# Patient Record
Sex: Female | Born: 1980 | Race: White | Hispanic: No | Marital: Married | Smoking: Never smoker
Health system: Southern US, Community
[De-identification: ages and names within clinical notes are randomized; demographics above are authoritative.]

## PROBLEM LIST (undated history)

## (undated) DIAGNOSIS — F32A Depression, unspecified: Secondary | ICD-10-CM

## (undated) DIAGNOSIS — Z8489 Family history of other specified conditions: Secondary | ICD-10-CM

## (undated) DIAGNOSIS — I6783 Posterior reversible encephalopathy syndrome: Secondary | ICD-10-CM

## (undated) DIAGNOSIS — D649 Anemia, unspecified: Secondary | ICD-10-CM

## (undated) DIAGNOSIS — F908 Attention-deficit hyperactivity disorder, other type: Secondary | ICD-10-CM

## (undated) DIAGNOSIS — K219 Gastro-esophageal reflux disease without esophagitis: Secondary | ICD-10-CM

## (undated) DIAGNOSIS — G971 Other reaction to spinal and lumbar puncture: Secondary | ICD-10-CM

## (undated) DIAGNOSIS — F419 Anxiety disorder, unspecified: Secondary | ICD-10-CM

## (undated) DIAGNOSIS — F329 Major depressive disorder, single episode, unspecified: Secondary | ICD-10-CM

## (undated) DIAGNOSIS — F909 Attention-deficit hyperactivity disorder, unspecified type: Secondary | ICD-10-CM

## (undated) DIAGNOSIS — G932 Benign intracranial hypertension: Secondary | ICD-10-CM

## (undated) DIAGNOSIS — R51 Headache: Secondary | ICD-10-CM

## (undated) DIAGNOSIS — R519 Headache, unspecified: Secondary | ICD-10-CM

## (undated) HISTORY — PX: OTHER SURGICAL HISTORY: SHX169

## (undated) HISTORY — PX: ABDOMINAL HYSTERECTOMY: SHX81

## (undated) HISTORY — PX: GASTRECTOMY: SHX58

## (undated) HISTORY — DX: Benign intracranial hypertension: G93.2

## (undated) HISTORY — PX: TONSILLECTOMY: SUR1361

---

## 2000-01-10 ENCOUNTER — Other Ambulatory Visit: Admission: RE | Admit: 2000-01-10 | Discharge: 2000-01-10 | Payer: Self-pay | Admitting: Obstetrics and Gynecology

## 2003-09-19 ENCOUNTER — Other Ambulatory Visit: Admission: RE | Admit: 2003-09-19 | Discharge: 2003-09-19 | Payer: Self-pay | Admitting: Obstetrics and Gynecology

## 2004-10-25 ENCOUNTER — Other Ambulatory Visit: Admission: RE | Admit: 2004-10-25 | Discharge: 2004-10-25 | Payer: Self-pay | Admitting: Obstetrics and Gynecology

## 2005-03-03 ENCOUNTER — Inpatient Hospital Stay (HOSPITAL_COMMUNITY): Admission: AD | Admit: 2005-03-03 | Discharge: 2005-03-03 | Payer: Self-pay | Admitting: Obstetrics and Gynecology

## 2005-03-12 ENCOUNTER — Inpatient Hospital Stay (HOSPITAL_COMMUNITY): Admission: AD | Admit: 2005-03-12 | Discharge: 2005-03-13 | Payer: Self-pay | Admitting: Obstetrics and Gynecology

## 2005-04-30 ENCOUNTER — Inpatient Hospital Stay (HOSPITAL_COMMUNITY): Admission: AD | Admit: 2005-04-30 | Discharge: 2005-04-30 | Payer: Self-pay | Admitting: Obstetrics and Gynecology

## 2005-05-12 ENCOUNTER — Inpatient Hospital Stay (HOSPITAL_COMMUNITY): Admission: AD | Admit: 2005-05-12 | Discharge: 2005-05-16 | Payer: Self-pay | Admitting: Obstetrics and Gynecology

## 2005-05-19 ENCOUNTER — Ambulatory Visit: Admission: RE | Admit: 2005-05-19 | Discharge: 2005-05-19 | Payer: Self-pay | Admitting: Obstetrics and Gynecology

## 2007-06-02 ENCOUNTER — Inpatient Hospital Stay (HOSPITAL_COMMUNITY): Admission: AD | Admit: 2007-06-02 | Discharge: 2007-06-03 | Payer: Self-pay | Admitting: Obstetrics and Gynecology

## 2007-10-06 ENCOUNTER — Inpatient Hospital Stay (HOSPITAL_COMMUNITY): Admission: AD | Admit: 2007-10-06 | Discharge: 2007-10-06 | Payer: Self-pay | Admitting: Obstetrics and Gynecology

## 2007-10-08 ENCOUNTER — Inpatient Hospital Stay (HOSPITAL_COMMUNITY): Admission: AD | Admit: 2007-10-08 | Discharge: 2007-10-09 | Payer: Self-pay | Admitting: Obstetrics and Gynecology

## 2007-10-10 ENCOUNTER — Inpatient Hospital Stay (HOSPITAL_COMMUNITY): Admission: AD | Admit: 2007-10-10 | Discharge: 2007-10-10 | Payer: Self-pay | Admitting: Obstetrics and Gynecology

## 2007-10-15 ENCOUNTER — Inpatient Hospital Stay (HOSPITAL_COMMUNITY): Admission: RE | Admit: 2007-10-15 | Discharge: 2007-10-18 | Payer: Self-pay | Admitting: Obstetrics and Gynecology

## 2007-10-22 ENCOUNTER — Inpatient Hospital Stay (HOSPITAL_COMMUNITY): Admission: AD | Admit: 2007-10-22 | Discharge: 2007-10-22 | Payer: Self-pay | Admitting: Obstetrics and Gynecology

## 2007-10-23 ENCOUNTER — Inpatient Hospital Stay (HOSPITAL_COMMUNITY): Admission: AD | Admit: 2007-10-23 | Discharge: 2007-10-23 | Payer: Self-pay | Admitting: Obstetrics and Gynecology

## 2008-09-20 ENCOUNTER — Emergency Department (HOSPITAL_COMMUNITY): Admission: EM | Admit: 2008-09-20 | Discharge: 2008-09-21 | Payer: Self-pay | Admitting: Emergency Medicine

## 2009-03-09 ENCOUNTER — Encounter
Admission: RE | Admit: 2009-03-09 | Discharge: 2009-03-09 | Payer: Self-pay | Admitting: Physical Medicine and Rehabilitation

## 2010-01-31 ENCOUNTER — Emergency Department (HOSPITAL_COMMUNITY)
Admission: EM | Admit: 2010-01-31 | Discharge: 2010-01-31 | Payer: Self-pay | Source: Home / Self Care | Admitting: Emergency Medicine

## 2010-02-05 ENCOUNTER — Encounter: Admission: RE | Admit: 2010-02-05 | Discharge: 2010-02-05 | Payer: Self-pay | Admitting: Neurology

## 2010-02-07 ENCOUNTER — Emergency Department (HOSPITAL_COMMUNITY)
Admission: EM | Admit: 2010-02-07 | Discharge: 2010-02-08 | Payer: Self-pay | Source: Home / Self Care | Admitting: Emergency Medicine

## 2010-05-14 LAB — URINALYSIS, ROUTINE W REFLEX MICROSCOPIC
Glucose, UA: NEGATIVE mg/dL
Hgb urine dipstick: NEGATIVE
Ketones, ur: NEGATIVE mg/dL
Nitrite: NEGATIVE
Specific Gravity, Urine: 1.011 (ref 1.005–1.030)
Urobilinogen, UA: 0.2 mg/dL (ref 0.0–1.0)
pH: 6.5 (ref 5.0–8.0)

## 2010-07-16 NOTE — Discharge Summary (Signed)
NAMEJASLEN, ADCOX            ACCOUNT NO.:  [id number]   MEDICAL RECORD NO.:  [id number]          PATIENT TYPE:  INP   LOCATION:  9143                          FACILITY:  [facility]   PHYSICIAN:  Crist Fat. Rivard, M.D. DATE OF BIRTH:  02/06/1981   DATE OF ADMISSION:  10/15/2007  DATE OF DISCHARGE:  10/18/2007                               DISCHARGE SUMMARY   [name] is a 30 year old gravida 2, para 1-0-0-1, who presents on  the date of admission of October 15, 2007, for a scheduled repeat  cesarean section.  The patient's pregnancy has been remarkable for:   1. Previous C-section in March 2007, secondary to failure to progress      and PIH with desire for repeat.  2. History of preeclampsia with first pregnancy with sporadic blood      pressure elevations this pregnancy, but with no preeclampsia.  3. Group beta strep negative.  4. First trimester spotting.   ADMITTING DIAGNOSES:  1. Intrauterine pregnancy at 38-3/7 weeks.  2. Previous cesarean section with desire for repeat.  3. Pregnancy-induced hypertension.  4. Group beta strep negative.   DISCHARGE DIAGNOSES:  1. Intrauterine pregnancy at term.  2. Status post a repeat low transverse cesarean section with findings      of a viable female infant by the name of [name] on October 15, 2007, at      1402 p.m.  Weight was 7 pounds 8 ounces, grams was 3415.  Apgar      score 9 at one minute and 9 at five minutes.  3. Anemia.  4. Breast and bottle feeding.  5. Significant back pain, status post spinal.   PROCEDURES:  1. Spinal anesthesia.  2. Repeat low transverse cesarean section.   HOSPITAL COURSE:  The patient presented to short stay where she was  prepped for her procedure.  She was taken to the OR where a repeat low  transverse C-section was performed by Dr. Dierdre Forth, assisted by  Renaldo Reel. Emilee Hero, C.N.M.  The procedure was performed under spinal  anesthesia.  Findings were a viable female infant by the name of  [name],  with a weight of 7 pounds 8 ounces, Apgars 9 at one minute and 9 at five  minutes.  Estimated blood loss was 750 mL.  Newborn infant was taken to  full-term nursery and the patient was taken to PACU in good condition  following the procedure.  By postoperative day #1, she was doing well,  breast feeding, had some anxiety about JP drain removal.  Vital signs  were stable.  Physical exam was within normal limits.  Abdomen was soft,  appropriately tender.  Dressing was clean, dry and intact.  JP with a  small amount of serosanguineous drainage, lochia within normal limits.  Hemoglobin was 8.9, preop was 10.7.  Routine care was continued.  By  postoperative day, the patient was having significant back pain, but  otherwise was doing well.  She was afebrile, vital signs stable, abdomen  was soft, incision was clean, dry and intact and lochia was scant.  By  postoperative day #3, she  was doing okay.  She still continued having  significant back pain which she reported was more than her abdominal  pain.  She was using Motrin, Percocet and heating pad.  She was up ad  lib without dizziness or syncope.  She was voiding without difficulty.  She reported her vaginal bleeding was light with some intermittent  gushes.  She was tolerating a regular diet.  Husband is planning  vasectomy and for interim, the patient desires a Depo-Provera injection.  She continues to breast feed and bottle feed.  Vital signs remained  stable.  Blood pressures have been borderline, range over the last 24  hours 128-148 systolic with diastolic 78-89.  JP drain with 8 mL of  drainage over the last 24 hours, it is serosanguineous.  Physical exam  remains within normal limits.  Her lungs are clear to auscultation  bilaterally.  Abdomen is soft, appropriately tender.  Fundus is firm  below the umbilicus.  Lochia _________rubra.  Incision has numerous  Steri-Strips that are intact.  She did have a little bit of yellowish   drainage with just a small amount of odor.  She had some places where it  looked like some contact dermatitis from her adhesive from her postop  dressing.  JP drain was discontinued without difficulty, the patient  tolerated it well.  A Band-Aid was applied over top of her Steri-Strips.  Extremities have 2+ pitting edema on bilateral shins, which she had  prior to delivery as well.  Negative Homan sign.  The patient was deemed  to have received full benefit from her hospital stay and was discharged  home in stable condition.  Discharge follow up is to occur in 6 weeks or  as needed at [facility].   DISCHARGE INSTRUCTIONS:  Per [facility] pamphlet.  Warning signs and symptoms  to report were reviewed with patient.   MEDICATIONS:  1. She was given a prescription for Motrin 600 mg p.o. q.6 h., p.r.n.      pain.  2. Percocet 5/325 one-to-two tabs p.o. q.4-6 h., p.r.n. pain.   She is to continue her [employer] prenatal vitamin which has 85 mg of  elemental iron 1 tab p.o. daily.  She is also to obtain a stool softener  such as Colace 1 tablet in the morning and 1 tablet in the evening p.o.  to prevent constipation.      Candice [place], [place]      ______________________________  Crist Fat Rivard, M.D.    CHS/MEDQ  D:  10/18/2007  T:  10/18/2007  Job:  [id number]

## 2010-07-16 NOTE — H&P (Signed)
NAMEARDITH, TEST            ACCOUNT NO.:  [id number]   MEDICAL RECORD NO.:  [id number]         PATIENT TYPE:  WINP   LOCATION:                                [facility]   PHYSICIAN:  Hal Morales, M.D.DATE OF BIRTH:  1980/12/25   DATE OF ADMISSION:  10/15/2007  DATE OF DISCHARGE:                              HISTORY & PHYSICAL   [name] is a 30 year old gravida 2, para 1-0-0-1 who presents on  October 15, 2007, for scheduled repeat cesarean section.  The patient's  pregnancy has been remarkable for:  1. Previous cesarean section in March 2007 secondary to failure to      progress and PIH with desire for repeat.  2. History of preeclampsia with her first pregnancy with sporadic      elevations of blood pressure this pregnancy as well but no      preeclampsia.  3. Group B strep negative.  4. First trimester spotting.   PRENATAL LABORATORIES:  Blood type is O positive, Rh antibody negative,  urine nonreactive, rubella titer positive, hepatitis B surface antigen  negative, HIV nonreactive.  Cystic fibrosis testing was negative.  GC  and chlamydia cultures were negative in the first trimester.  Pap was  done and was normal.  She had a normal first trimester screen.  She had  a Glucola at 26 weeks that was normal.  Group B strep culture was  negative at 36 weeks.   HISTORY OF PRESENT PREGNANCY:  The patient entered care at approximately  10 weeks and 3 days.  She had had a 7-week ultrasound secondary to first  trimester spotting with an [facility] of October 26, 2007, consistent with LMP  and ultrasound dating.  She had some nasal congestion in the first  trimester.  She had a first trimester screen that was normal.  She had  another ultrasound at 18 weeks showing normal growth with an anterior  placenta.  She was on Tussionex at 19 weeks for cough.  She was  initially considering VBAC but did not want to be induced if she did not  labor spontaneously.  She did have a  2-layer closure with that  pregnancy.  At 26 weeks, she had some vaginal spotting.  Inspection with  Valsalva maneuver.  She had an ultrasound at 27 weeks for followup on  this with no abnormal findings.  Growth was at the 51st percentile.  Cervix was 4.99 cm long.  Glucola was normal.  She was placed on  Protonix for reflux when Zantac did not help, and then she was  continuing on Phenergan.  She continued to have some spotting at 27  weeks.  Cervix was closed and long.  She did have some right hip pain by  30 weeks and was placed on Vicodin.  She had some lower extremity  swelling and was given a prescription for compression hose.  She was  placed on Flexeril at 32 weeks for the hip pain.  By 34 weeks, she was  beginning to consider repeat cesarean section and elected to have this  scheduled on the 14th.  During  her pregnancy, her pressures were in the  110s/70s.  Between 34 and 36 weeks, they began to be slightly higher at  122/80, then the week of August 9 she was noted to have some mildly  elevated blood pressures with some diastolics in the 90s when she was  sitting up.  These did resolve to normal limits when she was resting.  She had a PIH evaluation and had a 24-hour urine on August 9 showing a  protein level of 138 and normal PIH labs.  She was then continued on  rest, and the decision was made to proceed with cesarean section as  scheduled on August 14.  Group B strep culture was negative at 36 weeks.   PREVIOUS OBSTETRICAL HISTORY:  In 2007, she had a primary low transverse  cesarean section of a female infant, weight 7 pounds 3 ounces at 38-4/7  weeks.  She was in labor 24 hours.  She had epidural anesthesia.  She  was induced for this secondary to preeclampsia and had failure to  progress to 5 cm and then was delivered.  This pregnancy is with the  same partner.   MEDICAL HISTORY:  She is a previous oral contraceptive user.  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  She  reports the usual childhood illnesses.  She does have a history of some minor superficial varicosities.  She has  had a UTI in the past.   SURGICAL HISTORY:  Includes a C-section in 2007, tonsils removed as a  child, and gum surgery in the past.   ALLERGIES:  PENICILLIN, SULFA AND ASPIRIN.   FAMILY HISTORY:  Her father had heart disease with a blockage in the  heart.  Her mother, sister and maternal aunt all have varicosities.  Her  maternal grandmother had kidney disease.  Her father was a previous  alcohol user.  He is now recovering.  Paternal grandfather has  Alzheimer's.  Her mother had a stroke last year.  She had some  peripheral vision loss but no other deficit.  Maternal grandmother had  colon cancer.  Her mother had breast cancer.  Maternal grandfather had  lung cancer.  Genetic history is unremarkable.   SOCIAL HISTORY:  The patient is married to the father of the baby.  He  is involved and supportive.  His name is Jorja Loa.  The patient  is Caucasian.  She denies any religious affiliation.  She is high school  educated.  She is a Futures trader.  Her husband also has a high school  education.  He is a Teaching laboratory technician.  She has been followed by  the certified nurse midwife service at [facility] with  physician consultation regarding her desire for C-section.  She denies  any alcohol, drug or tobacco use during this pregnancy.   PHYSICAL EXAMINATION:  VITAL SIGNS:  Blood pressures have been in the  120s over 80s.  Other vital signs are stable.  HEENT:  Within normal limits.  LUNGS:  Breath sounds are clear.  HEART:  Regular rate and rhythm without murmur.  BREASTS:  Soft and nontender.  ABDOMEN:  Fundal height is approximately 38 cm.  Estimated fetal weight  is 7 to 8 pounds.  Uterine contractions are very occasional and mild.  PELVIC:  Exam deferred.  EXTREMITIES:  Deep tendon reflexes are 2+ without clonus.  There is  trace to 1+ edema noted.  Fetal  heart rate has been in the 150s by  Doppler.   IMPRESSION:  1. Intrauterine pregnancy at 67 and 3/7 weeks.  2. Previous cesarean section with desire for repeat.  3. Pregnancy-induced hypertension.  4. Group B streptococcus negative.   PLAN:  1. Admit to [facility] for consult with Dr. Dierdre Forth as attending physician.  2. Routine physician preoperative orders.     Renaldo Reel Emilee Hero, C.N.M.      ______________________________  Hal Morales, M.D.   VLL/MEDQ  D:  10/12/2007  T:  10/12/2007  Job:  [id number]

## 2010-07-16 NOTE — Op Note (Signed)
[name], [name]            ACCOUNT NO.:  [id number]   MEDICAL RECORD NO.:  [id number]          PATIENT TYPE:  INP   LOCATION:  9143                          [facility]   PHYSICIAN:  Hal Morales, M.D.DATE OF BIRTH:  08-12-1980   DATE OF PROCEDURE:  10/15/2007  DATE OF DISCHARGE:                               OPERATIVE REPORT   PREOPERATIVE DIAGNOSIS:  Intrauterine pregnancy at term, prior cesarean  section.  Desire for repeat cesarean section.   POSTOPERATIVE DIAGNOSIS:  Intrauterine pregnancy at term, prior cesarean  section.  Desire for repeat cesarean section.   OPERATION:  Repeat low transverse cesarean section.   SURGEON:  Hal Morales, MD   FIRST ASSISTANT:  Renaldo Reel. Emilee Hero, certified nurse midwife.   ANESTHESIA:  Spinal.   ESTIMATED BLOOD LOSS:  750 mL.   COMPLICATIONS:  None.   FINDINGS:  The patient delivered a female infant, whose name is [name]  weighing 7 pounds 8 ounces with a Apgars of 9 and 9 at 1 and 5 minutes  respectively.  The uterus, tubes, and ovaries were normal for the gravid  state.   PROCEDURE:  The patient was taken to the operating room after  appropriate identification and placed on the operating table.  After the  placement of a spinal anesthetic, she had the abdomen and perineum  prepped with multiple layers of Betadine.  A Foley catheter was inserted  into the bladder and connected to straight drainage.  The abdomen was  draped as a sterile field.  After assurance of adequate surgical  anesthesia, the suprapubic region was infiltrated with 20 mL of 0.25%  Marcaine.  A transverse incision was made in the abdomen and the abdomen  opened in layers.  The peritoneum was entered and the bladder blade  placed.  The uterus was incised approximately 2 cm above the  uterovesical fold and that incision taken laterally on either side  bluntly.  The infant was delivered from the occiput transverse position  with the aid of a bell  vacuum extractor.  The nares and pharynx were  suctioned and the cord clamped and cut.  The infant was handed off to  the awaiting pediatricians.  The appropriate cord blood was drawn, and  the placenta allowed to separate from the uterus then removed from the  operative field.  Uterine cavity was cleared of products of conception.  Uterine incision was closed with a running interlocking suture of 0  Vicryl.  An imbricating suture of 0 Vicryl was then placed.  Hemostasis  was noted to be adequate.  Copious irrigation was carried out.  The  abdominal peritoneum was closed with running suture of 2-0 Vicryl.  The  rectus muscles were reapproximated in the midline with figure-of-eight  suture of 2-0 Vicryl.  The rectus muscles were irrigated and made  hemostatic with Bovie cautery.  The rectus fascia was closed with a  running suture of 0 Vicryl then reinforced on either side of midline  with figure-of-eight sutures of 0 Vicryl.  The subcutaneous tissue was  irrigated and made hemostatic with Bovie cautery.  A subcutaneous  Jackson-Pratt drain was placed in the subcutaneous space through a stab  hole in the left lower quadrant.  It was sewn in with a suture of 0  silk.  The skin incision was closed with a subcuticular suture of 3-0  Monocryl.  Steri-Strips were applied.  The patient was taken  from the operating room to the recovery room in satisfactory condition,  having tolerated the procedure well with sponge and instrument counts  correct.  Specimens to pathology none.  Placenta went to birthing  suites.  The infant went to the full-term nursery.      Hal Morales, M.D.  Electronically Signed     VPH/MEDQ  D:  10/15/2007  T:  10/16/2007  Job:  [id number]

## 2010-07-19 NOTE — Op Note (Signed)
[name], [name]            ACCOUNT NO.:  [id number]   MEDICAL RECORD NO.:  [id number]          PATIENT TYPE:  INP   LOCATION:  9102                          [facility]   PHYSICIAN:  [name], M.D. DATE OF BIRTH:  07-13-1980   DATE OF PROCEDURE:  05/13/2005  DATE OF DISCHARGE:                                 OPERATIVE REPORT   PREOPERATIVE DIAGNOSES:  1.  Intrauterine pregnancy at term.  2.  Failure to progress.  3.  Pregnancy-induced hypertension.   POSTOPERATIVE DIAGNOSES:  1.  Intrauterine pregnancy at term.  2.  Failure to progress.  3.  Pregnancy-induced hypertension.   OPERATION/PROCEDURE:  Primary low transverse cesarean section with two-layer  closure.   ANESTHESIA:  Epidural.   IV FLUIDS:  2500 mL crystalloid.   URINARY OUTPUT:  200 mL clear urine at the end of the procedure.   ESTIMATED BLOOD LOSS:  900 mL.   FINDINGS:  Female infant in vertex presentation with clear fluid, nuchal cord  x1.  Apgars 9 and 9.  Normal abdominal and pelvic anatomy.  No  complications.  The patient to recovery room in stable condition.   DESCRIPTION OF PROCEDURE:  The patient was taken to the operating room where  epidural anesthesia was found to be adequate.  She was prepped and draped in  the normal sterile fashion.  A Foley catheter was already in place.  A  Pfannenstiel skin incision was made with the scalpel and carried down to the  fascia using Bovie cautery.  The fascia was incised in the midline and  extended bilaterally.  Kochers x2 were placed in the superior aspect of the  fascia which was dissected the rectus muscles both sharply and bluntly and  the inferior aspect of the fascia was dissected in a similar fashion.  Rectus muscle was separated in the midline.  The peritoneum was identified,  tented up and entered sharply.  The bladder blade was inserted.  The  vesicouterine peritoneum was identified, entered sharply and extended  bilaterally.  Bladder  blade was reinserted and primary low transverse  uterine incision was made with the scalpel and extended bluntly.  The infant  was delivered without difficulty.  Mouth and nares were bulb-suctioned.  The  nuchal cord was easily reduced.  Body delivered without difficulty.  Cord  was clamped and cut.  Infant was handed over to the awaiting pediatrician.  Placenta was manually delivered.  Uterus was cleared of all clots and  debris.  Uterine incision was repaired with 0 Vicryl in a running locked  fashion.  A second layer of 0 Vicryl was used to imbricate the uterus.  Irrigation was done. Hemostasis was noted.  The peritoneum was closed with 0  chromic.  The muscles were noted to be hemostatic.  The fascia was closed  with 0 Vicryl in a running fashion.  A Jackson-Pratt drain was placed in the  subcutaneous area and subcutaneous tissue was made hemostatic  with Bovie cautery in any bleeding areas.  Subcutaneous tissue was  reapproximated using 2-0 plain.  The skin was closed with a 3-0 Monocryl  in  a subcuticular fashion.  Sponge, lab and needle counts were correct x2.  The  patient went to the recovery room in stable condition.      Margaret A. Normand Sloop, M.D.  Electronically Signed     NAD/MEDQ  D:  05/13/2005  T:  05/14/2005  Job:  [id number]

## 2010-07-19 NOTE — Discharge Summary (Signed)
NAMEJAMEY, DEMCHAK            ACCOUNT NO.:  [id number]   MEDICAL RECORD NO.:  [id number]          PATIENT TYPE:  INP   LOCATION:  9102                          [facility]   PHYSICIAN:  Crist Fat. Rivard, M.D. DATE OF BIRTH:  Apr 23, 1980   DATE OF ADMISSION:  05/12/2005  DATE OF DISCHARGE:  05/16/2005                                 DISCHARGE SUMMARY   ADMITTING DIAGNOSES:  1.  Intrauterine pregnancy at term.  2.  Pregnancy induced hypertension.   DISCHARGE DIAGNOSES:  1.  Intrauterine pregnancy at term, delivered.  2.  Primary low-transverse cesarean section secondary to failure to      progress.  3.  Pregnancy induced hypertension, resolving.   PROCEDURES:  Primary low-transverse cesarean section.   HOSPITAL COURSE:  Ms. Mccauley is a 30 year old gravida 1, para 0, who was  admitted at 94 and 3/7 weeks for evaluation of elevated blood pressure in  the office.  The patient with elevated blood pressures intermittently  approximately 2-3 weeks prior to admission and has been followed with serial  labs.  The patient was scheduled to be induced on May 12, 2005; however,  due to her blood pressure, it was decided to go ahead and proceed with  induction of labor on the day of admission.  The patient admitted on May 12, 2005.  The patient pregnancy otherwise had been remarkable for:  1.  Increased BMI.  2.  Large for gestational age infant.  3.  Pregnancy induced hypertension.  4.  Toxo risk.   The patient's labor was initially with cervical ripening with Cervidil.  The  patient contracting spontaneously with Cervidil on the day of admission.  The morning following admission on March 13, the patient's labor was  augmented with Pitocin.  The patient progressed slowly and at 5:30 p.m., the  cervix 5 cm dilated, 80% effaced and -3 station.  At that time, the patient  elected to defer C-section 1 hour to be reevaluated and if no cervical  change, then proceed with  C-section.  The patient's cervical exam remained  unchanged.  Therefore, a consult was obtained with Dr. Normand Sloop and it was  determined that primary low-transverse cesarean section was indicated.  The  patient under a primary low-transverse cesarean section and delivered a  viable female infant with Apgars of 9 and 9, weight 7 pounds 3 ounces.  The  surgery was uncomplicated.  Estimated blood loss 900 mL.  The patient's  initial admission hemoglobin 11.2 and postoperative day #1, hemoglobin 8.7  and postoperative day #2, hemoglobin 8.5 normal which was stable.  The  patient was ambulating, voiding, and tolerating liquids and solids on  postoperative day #3 without difficulty.  The patient with no syncopal  symptoms and orthostatic vitals were stable on postoperative day 2 and 3.  The patient's lab work remained stable throughout her hospitalization in  regards to patient's metabolic panel, liver functions, uric acid and  platelet count.  Circumcision was performed without issues.  By postpartum  day #3, the patient was doing well and was deemed to have received full  benefit of  her hospital stay.  She was discharged home.   DISCHARGE CONDITION:  Stable.   DISCHARGE INSTRUCTIONS:  Per [facility] handout.   DISCHARGE MEDICATIONS:  1.  Motrin 600 mg p.o. q.6h. p.r.n. pain.  2.  Tylox 1-2 tabs p.o. q.4-6h. p.r.n. pain.  3.  Prenatal vitamins 1 tab daily.  4.  Tandem-F 1 tab twice daily.   DISCHARGE FOLLOWUP:  The patient will return to the office in 1-2 weeks for  her blood pressure checked and at 4-6 weeks for a routine postpartum visit  at [facility].      Rhona Leavens, CNM      Crist Fat Rivard, M.D.  Electronically Signed    NOS/MEDQ  D:  05/16/2005  T:  05/17/2005  Job:  [id number]

## 2010-07-19 NOTE — H&P (Signed)
[name], [name]            ACCOUNT NO.:  [id number]   MEDICAL RECORD NO.:  [id number]          PATIENT TYPE:  INP   LOCATION:  9173                          FACILITY:  [facility]   PHYSICIAN:  Crist Fat. Rivard, M.D. DATE OF BIRTH:  05/13/1980   DATE OF ADMISSION:  05/12/2005  DATE OF DISCHARGE:                                HISTORY & PHYSICAL   This is a 30 year old gravida 1, para 0 at 38-3/7 weeks who presents for  evaluation of elevated blood pressure in the office today.  She reports  seeing stars periodically with no headache.  Pregnancy has been followed by  the nurse midwife service and remarkable for (1) Increased VMI.  (2)  __________ at rest.  (3) LGA.  (4) PIH (the patient has been followed for  several weeks with elevated blood pressure and was scheduled for induction  on May 14, 2005.   OBSTETRICAL HISTORY:  The patient is a primigravida.   MEDICAL HISTORY:  1.  Remarkable for childhood varicella.  2.  History of varicosities.  3.  History of bladder infection x1.   SURGICAL HISTORY:  Surgery on gums and tonsils.   FAMILY HISTORY:  Remarkable for a grandfather and father with heart disease.  Mother and sisters with varicose veins.  Grandmother with kidney problems.  Grandfather with Alzheimer's.  Grandmother with breast cancer.  Grandfather  with lung cancer.   GENETIC HISTORY:  Unremarkable.   SOCIAL HISTORY:  The patient is married to [facility], who is involved  and supportive.  She does not report a religious affiliation.  She works as  a Conservation officer, nature.  She denies alcohol, tobacco, or drug use.   PRENATAL LABS:  Hemoglobin 12.9, platelets 304, blood type O positive,  antibody screen negative.  Toxo nonimmune, RPR nonreactive, rubella  equivocal, hepatitis negative, HIV declined.  Pap test normal.  Gonorrhea  negative.  Chlamydia negative.  Cystic fibrosis negative.   HISTORY OF CURRENT PREGNANCY:  The patient entered care at 10 weeks'  gestation.   She was treated for nausea at that time.  She had an ultrasound  at 18 weeks which was normal.  She continued to have nausea throughout the  pregnancy.  She had a Glucola at 28 weeks which was normal.  She had an  ultrasound at 32 weeks showing LGA at 99th percentile which was later  recalculated to be 62%.  Her group B strep was negative at term, and she  began to have some elevation in her blood pressure at 36 weeks and was  placed on bed rest.   OBJECTIVE DATA:  VITAL SIGNS:  Stable, afebrile.  HEENT:  Within normal limits.  Thyroid normal and not enlarged.  CHEST:  Clear to auscultation.  HEART:  Regular rate and rhythm.  ABDOMEN:  Gravid at 40 cm, vertex, Leopold's.  EFM shows reactive fetal  heart rate with rare contractions.  Cervix is fingertip to 1, 50%, minus 3  with a vertex presentation.  Cervix is posterior.  EXTREMITIES:  1+ edema,  2+ DTRs.   LABORATORY DATA:  CBC is normal.  CMET is also  normal.  AFC is 17, ALT is  12, uric acid 5.4.  Cath UA shows 100 mg/dl of protein.   ASSESSMENT:  1.  Intrauterine pregnancy at 38-3/7 weeks.  2.  Pregnancy-induced hypertension.   PLAN:  1.  Discussed with Dr. Estanislado Pandy, who recommended admission for induction of      labor rather than waiting 48 hours.  2.  Routine CNM orders.  3.  Cervidil overnight and then Pitocin in a.m.      Marie L. Williams, C.N.M.      Crist Fat Rivard, M.D.  Electronically Signed    MLW/MEDQ  D:  05/12/2005  T:  05/12/2005  Job:  [id number]

## 2010-11-26 LAB — URINE CULTURE

## 2010-11-26 LAB — WET PREP, GENITAL
Trich, Wet Prep: NONE SEEN
Yeast Wet Prep HPF POC: NONE SEEN

## 2010-11-26 LAB — URINALYSIS, ROUTINE W REFLEX MICROSCOPIC
Ketones, ur: NEGATIVE
Nitrite: NEGATIVE
Urobilinogen, UA: 0.2

## 2010-11-29 LAB — COMPREHENSIVE METABOLIC PANEL
ALT: 11
AST: 17
AST: 19
Albumin: 2.5 — ABNORMAL LOW
Albumin: 2.5 — ABNORMAL LOW
Albumin: 2.5 — ABNORMAL LOW
BUN: 7
BUN: 9
CO2: 23
Calcium: 8.6
Calcium: 8.7
Creatinine, Ser: 0.46
Creatinine, Ser: 0.55
Glucose, Bld: 121 — ABNORMAL HIGH
Glucose, Bld: 76
Potassium: 3.4 — ABNORMAL LOW
Potassium: 4.2
Total Bilirubin: 0.1 — ABNORMAL LOW
Total Protein: 5.5 — ABNORMAL LOW
Total Protein: 6

## 2010-11-29 LAB — CBC
HCT: 31.8 — ABNORMAL LOW
HCT: 32.3 — ABNORMAL LOW
Hemoglobin: 10.2 — ABNORMAL LOW
Hemoglobin: 10.7 — ABNORMAL LOW
Hemoglobin: 10.8 — ABNORMAL LOW
MCHC: 33.5
MCV: 89.5
Platelets: 221
RBC: 3.56 — ABNORMAL LOW
RBC: 3.59 — ABNORMAL LOW
RDW: 15.8 — ABNORMAL HIGH
WBC: 12.6 — ABNORMAL HIGH
WBC: 12.9 — ABNORMAL HIGH

## 2010-11-29 LAB — URIC ACID: Uric Acid, Serum: 4.1

## 2010-11-29 LAB — URINALYSIS, ROUTINE W REFLEX MICROSCOPIC
Bilirubin Urine: NEGATIVE
Hgb urine dipstick: NEGATIVE
Ketones, ur: NEGATIVE
Nitrite: NEGATIVE

## 2010-11-29 LAB — CREATININE CLEARANCE, URINE, 24 HOUR
Creatinine Clearance: 170 — ABNORMAL HIGH
Creatinine, Urine: 133.7
Creatinine: 0.58

## 2010-11-29 LAB — URINE MICROSCOPIC-ADD ON

## 2010-11-29 LAB — PROTEIN, URINE, 24 HOUR
Collection Interval-UPROT: 24
Protein, 24H Urine: 138 — ABNORMAL HIGH
Urine Total Volume-UPROT: 1060

## 2010-11-29 LAB — LACTATE DEHYDROGENASE: LDH: 142

## 2010-12-07 ENCOUNTER — Emergency Department (HOSPITAL_COMMUNITY): Payer: Self-pay

## 2010-12-07 ENCOUNTER — Emergency Department (HOSPITAL_COMMUNITY)
Admission: EM | Admit: 2010-12-07 | Discharge: 2010-12-07 | Disposition: A | Discharge status: Home / Self Care | Payer: Self-pay | Attending: Emergency Medicine | Admitting: Emergency Medicine

## 2010-12-07 DIAGNOSIS — F988 Other specified behavioral and emotional disorders with onset usually occurring in childhood and adolescence: Secondary | ICD-10-CM | POA: Insufficient documentation

## 2010-12-07 DIAGNOSIS — E669 Obesity, unspecified: Secondary | ICD-10-CM | POA: Insufficient documentation

## 2010-12-07 DIAGNOSIS — R1013 Epigastric pain: Secondary | ICD-10-CM | POA: Insufficient documentation

## 2010-12-07 DIAGNOSIS — M546 Pain in thoracic spine: Secondary | ICD-10-CM | POA: Insufficient documentation

## 2010-12-07 DIAGNOSIS — R110 Nausea: Secondary | ICD-10-CM | POA: Insufficient documentation

## 2010-12-07 DIAGNOSIS — R1011 Right upper quadrant pain: Secondary | ICD-10-CM | POA: Insufficient documentation

## 2010-12-07 LAB — CBC
HCT: 37.1 % (ref 36.0–46.0)
Hemoglobin: 12.4 g/dL (ref 12.0–15.0)
MCH: 30.2 pg (ref 26.0–34.0)
MCHC: 33.4 g/dL (ref 30.0–36.0)
MCV: 90.5 fL (ref 78.0–100.0)

## 2010-12-07 LAB — COMPREHENSIVE METABOLIC PANEL
ALT: 25 U/L (ref 0–35)
Albumin: 3.7 g/dL (ref 3.5–5.2)
Alkaline Phosphatase: 90 U/L (ref 39–117)
Chloride: 107 mEq/L (ref 96–112)
Glucose, Bld: 96 mg/dL (ref 70–99)
Potassium: 3.7 mEq/L (ref 3.5–5.1)
Sodium: 138 mEq/L (ref 135–145)
Total Protein: 7.4 g/dL (ref 6.0–8.3)

## 2010-12-07 LAB — URINALYSIS, ROUTINE W REFLEX MICROSCOPIC
Bilirubin Urine: NEGATIVE
Glucose, UA: NEGATIVE mg/dL
Hgb urine dipstick: NEGATIVE
Protein, ur: NEGATIVE mg/dL

## 2010-12-07 LAB — DIFFERENTIAL
Basophils Absolute: 0 10*3/uL (ref 0.0–0.1)
Eosinophils Relative: 2 % (ref 0–5)
Lymphocytes Relative: 24 % (ref 12–46)
Lymphs Abs: 2.5 10*3/uL (ref 0.7–4.0)
Monocytes Absolute: 0.8 10*3/uL (ref 0.1–1.0)
Monocytes Relative: 8 % (ref 3–12)
Neutro Abs: 7.2 10*3/uL (ref 1.7–7.7)

## 2010-12-07 LAB — URINE MICROSCOPIC-ADD ON

## 2010-12-11 ENCOUNTER — Emergency Department (HOSPITAL_COMMUNITY): Payer: Self-pay

## 2010-12-11 ENCOUNTER — Emergency Department (HOSPITAL_COMMUNITY)
Admission: EM | Admit: 2010-12-11 | Discharge: 2010-12-12 | Disposition: A | Discharge status: Home / Self Care | Payer: Self-pay | Attending: Emergency Medicine | Admitting: Emergency Medicine

## 2010-12-11 DIAGNOSIS — R110 Nausea: Secondary | ICD-10-CM | POA: Insufficient documentation

## 2010-12-11 DIAGNOSIS — Z79899 Other long term (current) drug therapy: Secondary | ICD-10-CM | POA: Insufficient documentation

## 2010-12-11 DIAGNOSIS — R109 Unspecified abdominal pain: Secondary | ICD-10-CM | POA: Insufficient documentation

## 2010-12-11 DIAGNOSIS — F411 Generalized anxiety disorder: Secondary | ICD-10-CM | POA: Insufficient documentation

## 2010-12-11 DIAGNOSIS — F988 Other specified behavioral and emotional disorders with onset usually occurring in childhood and adolescence: Secondary | ICD-10-CM | POA: Insufficient documentation

## 2010-12-11 DIAGNOSIS — R10819 Abdominal tenderness, unspecified site: Secondary | ICD-10-CM | POA: Insufficient documentation

## 2010-12-11 LAB — URINE MICROSCOPIC-ADD ON

## 2010-12-11 LAB — COMPREHENSIVE METABOLIC PANEL
Albumin: 4 g/dL (ref 3.5–5.2)
Alkaline Phosphatase: 86 U/L (ref 39–117)
BUN: 15 mg/dL (ref 6–23)
Potassium: 3.8 mEq/L (ref 3.5–5.1)
Sodium: 138 mEq/L (ref 135–145)
Total Protein: 7.6 g/dL (ref 6.0–8.3)

## 2010-12-11 LAB — DIFFERENTIAL
Basophils Absolute: 0 10*3/uL (ref 0.0–0.1)
Basophils Relative: 0 % (ref 0–1)
Eosinophils Absolute: 0.2 10*3/uL (ref 0.0–0.7)
Eosinophils Relative: 1 % (ref 0–5)

## 2010-12-11 LAB — URINALYSIS, ROUTINE W REFLEX MICROSCOPIC
Glucose, UA: NEGATIVE mg/dL
pH: 5.5 (ref 5.0–8.0)

## 2010-12-11 LAB — LIPASE, BLOOD: Lipase: 16 U/L (ref 11–59)

## 2010-12-11 LAB — CBC
MCV: 90 fL (ref 78.0–100.0)
Platelets: 342 10*3/uL (ref 150–400)
RDW: 14.3 % (ref 11.5–15.5)
WBC: 11.4 10*3/uL — ABNORMAL HIGH (ref 4.0–10.5)

## 2010-12-12 MED ORDER — IOHEXOL 300 MG/ML  SOLN
80.0000 mL | Freq: Once | INTRAMUSCULAR | Status: AC | PRN
  Administered 2010-12-12: 80 mL via INTRAVENOUS

## 2012-08-12 IMAGING — CT CT ABD-PELV W/ CM
3 of 4 series · 14 of 32 positions shown, 19 images · IV contrast (water/omni  & 100ml omni 300)
Comparison: Ultrasound [DATE]

CLINICAL DATA: Mid to lower abdominal pain.  Nausea.

CT ABDOMEN AND PELVIS WITH CONTRAST
TECHNIQUE: Multidetector CT imaging of the abdomen and pelvis was
performed following the standard protocol during bolus
administration of intravenous contrast.
Contrast: 80mL OMNIPAQUE IOHEXOL 300 MG/ML IV SOLN

[Series 2: routine abdomen · axial · 0.84mm/px · z∈[-432,-122]mm · 4 of 104 slices shown, 9 images]
[im 21/104  soft-tissue]
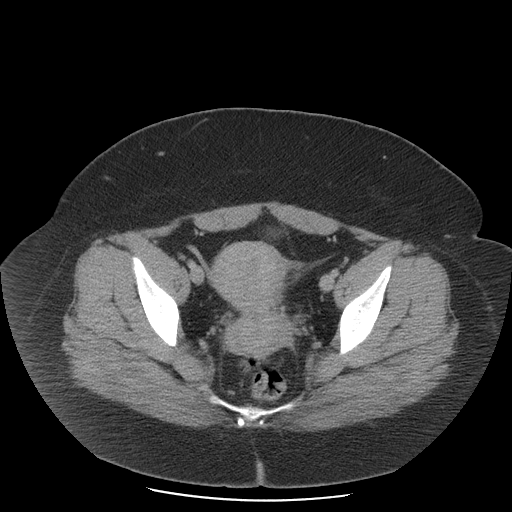
[im 21/104  lung]
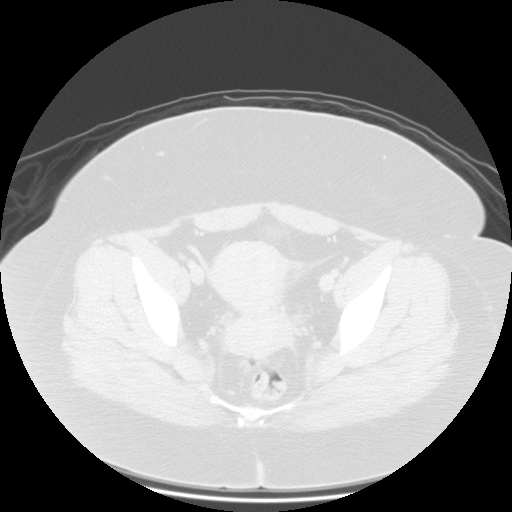
[im 21/104  bone]
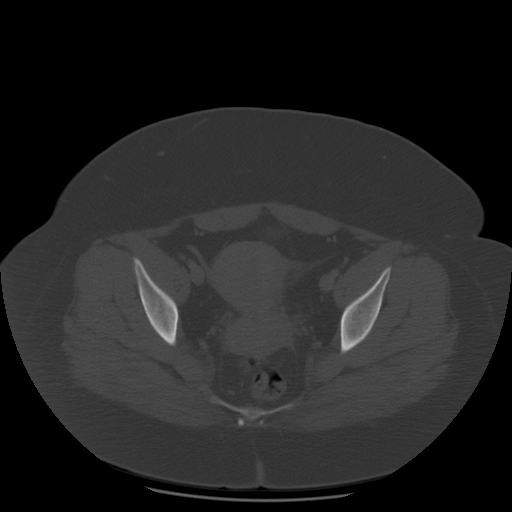
[im 42/104  soft-tissue]
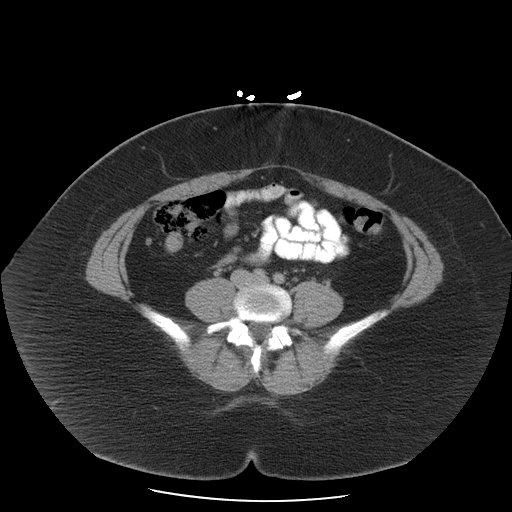
[im 42/104  lung]
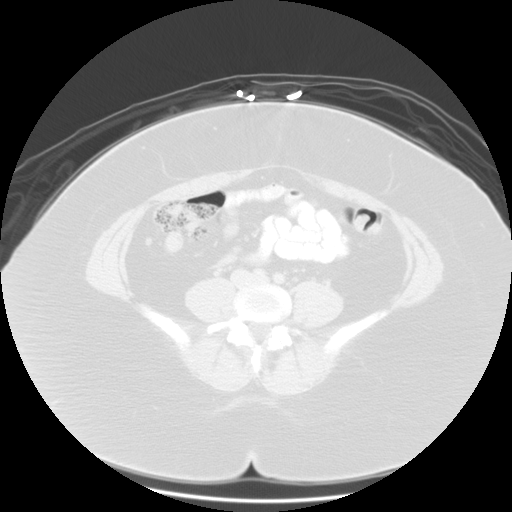
[im 62/104  soft-tissue]
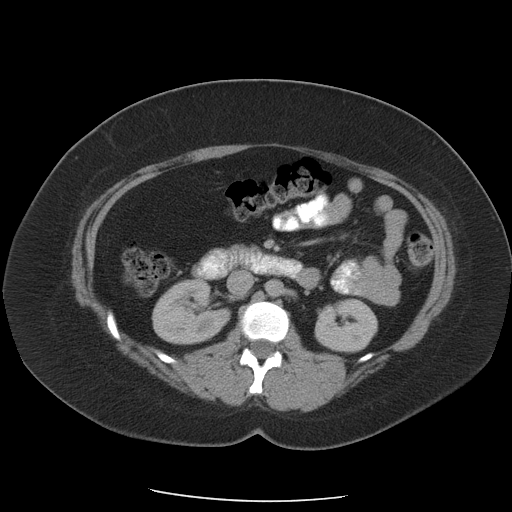
[im 62/104  lung]
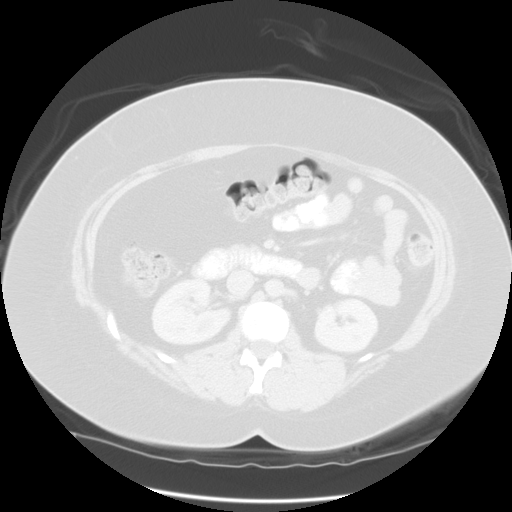
[im 83/104  soft-tissue]
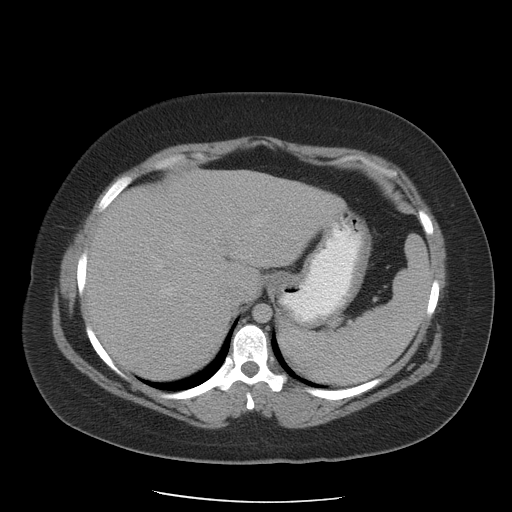
[im 83/104  lung]
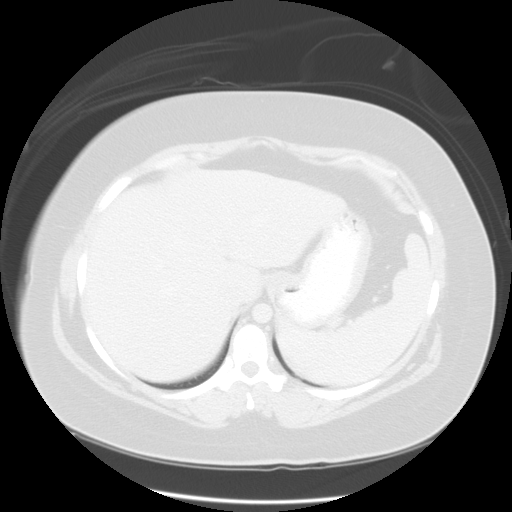

[Series 400: cor · coronal · 1.03mm/px · 2 of 169 slices shown]
[im 17/169  soft-tissue]
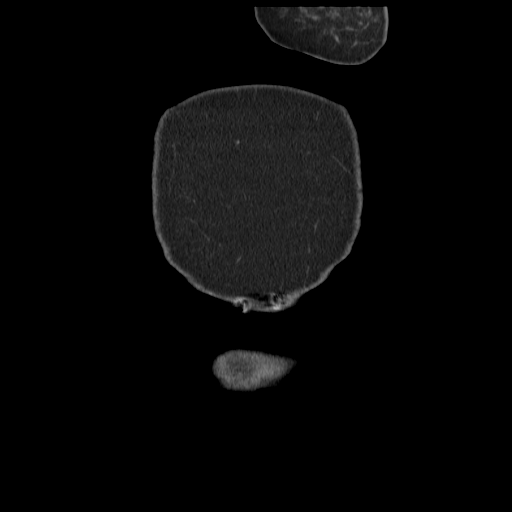
[im 34/169  soft-tissue]
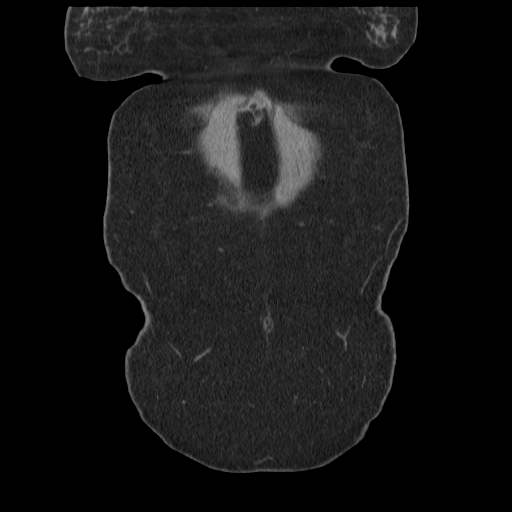

[Series 401: sag · sagittal · 1.03mm/px · 8 of 207 slices shown]
[im 18/207  soft-tissue]
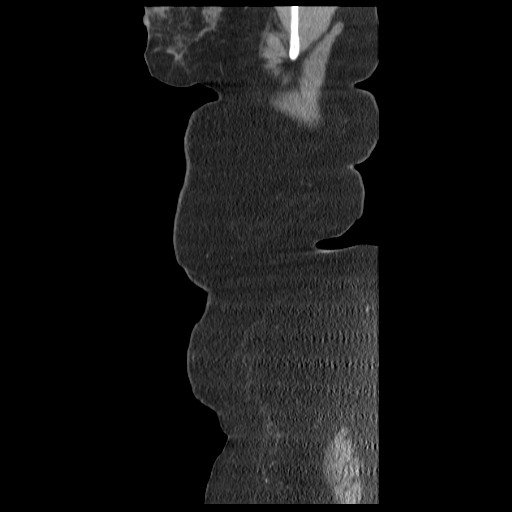
[im 52/207  soft-tissue]
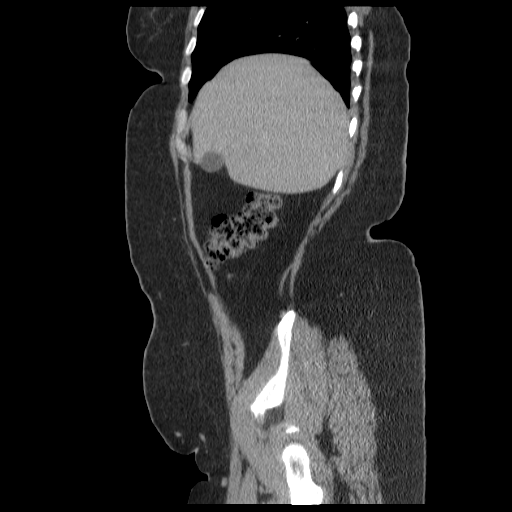
[im 69/207  soft-tissue]
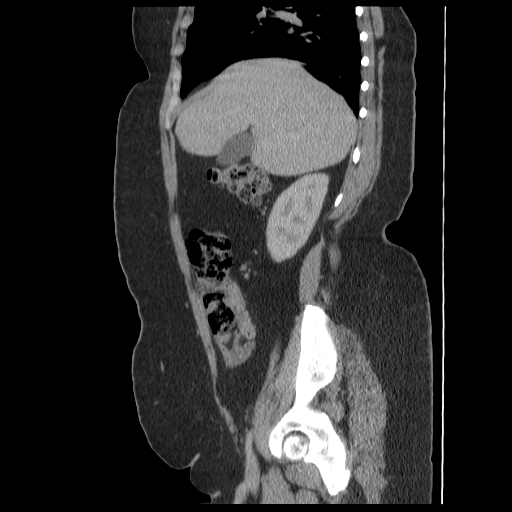
[im 86/207  soft-tissue]
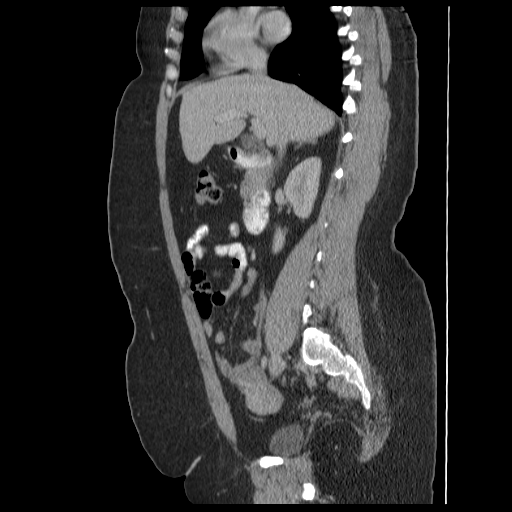
[im 121/207  soft-tissue]
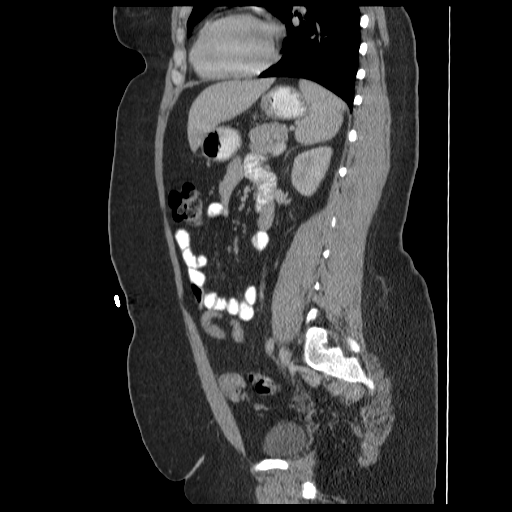
[im 138/207  soft-tissue]
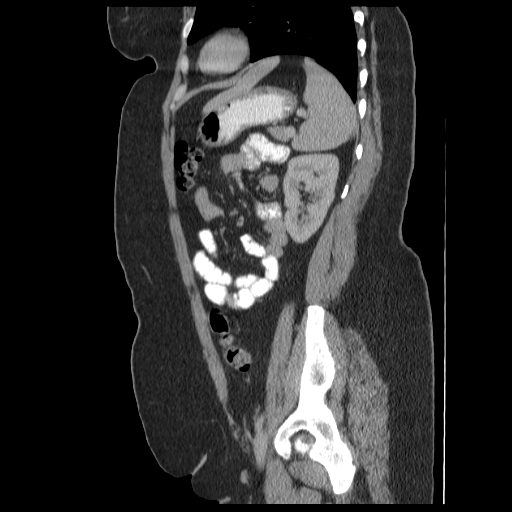
[im 155/207  soft-tissue]
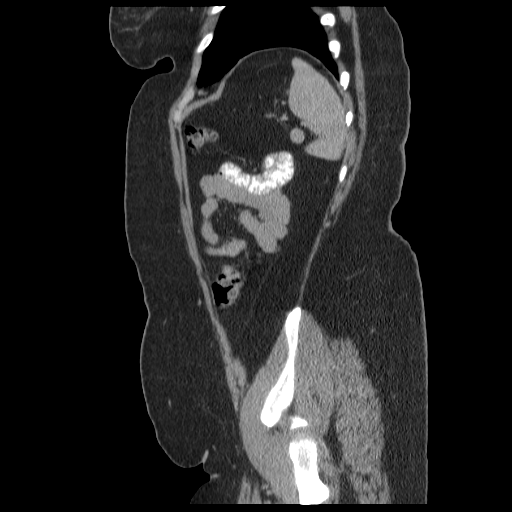
[im 189/207  soft-tissue]
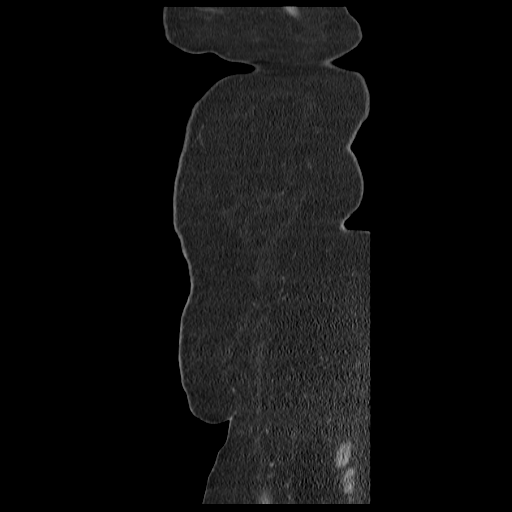

[14 of 32 positions shown; findings below may reference images not displayed]

FINDINGS: Lung bases are clear.  No pleural or pericardial fluid.
The liver has a normal appearance.  No calcified gallstones.  The
spleen is normal.  The pancreas is normal.  The adrenal glands are
normal.  The kidneys are normal.  The aorta and IVC are normal.  No
retroperitoneal mass or adenopathy.  No free intraperitoneal fluid
or air.

In the pelvis, the bladder appears normal.  The uterus appears
normal.  There is a left ovarian cyst measuring 5 cm in diameter
that could be a cause of pain.  The appendix is normal.  No bony
abnormality is seen.
IMPRESSION: The only finding of note is a 5 cm left ovarian cyst.  There is no
free fluid.  There is no bowel pathology.  Specifically, the
appendix is normal.

## 2012-09-24 ENCOUNTER — Encounter (HOSPITAL_COMMUNITY): Payer: Self-pay

## 2012-09-24 ENCOUNTER — Emergency Department (HOSPITAL_COMMUNITY)
Admission: EM | Admit: 2012-09-24 | Discharge: 2012-09-24 | Disposition: A | Discharge status: Home / Self Care | Payer: Self-pay | Attending: Emergency Medicine | Admitting: Emergency Medicine

## 2012-09-24 DIAGNOSIS — Z880 Allergy status to penicillin: Secondary | ICD-10-CM | POA: Insufficient documentation

## 2012-09-24 DIAGNOSIS — L02419 Cutaneous abscess of limb, unspecified: Secondary | ICD-10-CM | POA: Insufficient documentation

## 2012-09-24 DIAGNOSIS — L03119 Cellulitis of unspecified part of limb: Secondary | ICD-10-CM | POA: Insufficient documentation

## 2012-09-24 DIAGNOSIS — L0291 Cutaneous abscess, unspecified: Secondary | ICD-10-CM

## 2012-09-24 MED ORDER — HYDROCODONE-ACETAMINOPHEN 5-325 MG PO TABS
2.0000 | ORAL_TABLET | Freq: Once | ORAL | Status: AC
  Administered 2012-09-24: 2 via ORAL
  Filled 2012-09-24: qty 2

## 2012-09-24 MED ORDER — CEPHALEXIN 500 MG PO CAPS: 500.0000 mg | ORAL_CAPSULE | Freq: Four times a day (QID) | ORAL | Status: DC

## 2012-09-24 MED ORDER — SULFAMETHOXAZOLE-TRIMETHOPRIM 800-160 MG PO TABS: 1.0000 | ORAL_TABLET | Freq: Two times a day (BID) | ORAL | Status: DC

## 2012-09-24 MED ORDER — ONDANSETRON 4 MG PO TBDP
8.0000 mg | ORAL_TABLET | Freq: Once | ORAL | Status: AC
  Administered 2012-09-24: 8 mg via ORAL

## 2012-09-24 NOTE — ED Provider Notes (Signed)
CSN: [id number]     Arrival date & time 09/24/12  1653 History  This chart was scribed for non-physician practitioner working with Suzi Roots, MD, by Ardelia Mems ED Scribe. This patient was seen in room TR11C/TR11C and the patient's care was started at 6:33 PM.   First MD Initiated Contact with Patient 09/24/12 1829     No chief complaint on file.   The history is provided by the patient. No language interpreter was used.   HPI Comments: [name] is a 32 y.o. female who presents to the Emergency Department complaining of an abscess to her right upper thigh noticed 2 days ago, which has gradually worsened since. She states that she first noticed a small, raised red area on her thigh 2 days ago, which has gradually enlarged to a larger abscessed area. She has pain to the area of abscess which she described as soreness, and she states that her left hip feels sore. She states that this pain is worsened with palpation and with walking. She states that she has tried applying hot compresses without relief, and she attempted to drain the abscess earlier today, but states that attempting this was to painful, so she stopped after only draining a small amount of clear, reddish fluid, which she states was not malodorous. She states that she is not outside often, but she believes it is possible that she sustained a bug bite to the area. She denies fever, chills, nausea, vomiting, diarrhea, numbness, weakness or any other symptoms.Marland Kitchen  PCP- Dr. Merri Brunette   History reviewed. No pertinent past medical history.  History reviewed. No pertinent past surgical history.  No family history on file.  History  Substance Use Topics  . Smoking status: Never Smoker   . Smokeless tobacco: Not on file  . Alcohol Use: No   OB History   Grav Para Term Preterm Abortions TAB SAB Ect Mult Living                 Review of Systems  Constitutional: Negative for fever and chills.  Gastrointestinal:  Negative for nausea, vomiting and diarrhea.  Skin: Positive for wound (abscess).  Neurological: Negative for weakness and numbness.  All other systems reviewed and are negative.    Allergies  Asa; Penicillins; and Sudafed  Home Medications   Current Outpatient Rx  Name  Route  Sig  Dispense  Refill  . Ibuprofen (IBU PO)   Oral   Take 4 tablets by mouth every 6 (six) hours as needed (pain).         . phentermine (ADIPEX-P) 37.5 MG tablet   Oral   Take 37.5 mg by mouth daily before breakfast.          Triage Vitals: BP 158/88  Pulse 102  Temp(Src) 98.4 F (36.9 C) (Oral)  Resp 22  SpO2 99%  LMP 09/20/2012  Physical Exam  Nursing note and vitals reviewed. Constitutional: She is oriented to person, place, and time. She appears well-developed and well-nourished. No distress.  HENT:  Head: Normocephalic and atraumatic.  Right Ear: External ear normal.  Left Ear: External ear normal.  Nose: Nose normal.  Mouth/Throat: Oropharynx is clear and moist.  Eyes: Conjunctivae are normal.  Neck: Normal range of motion.  Cardiovascular: Normal rate, regular rhythm and normal heart sounds.   Pulmonary/Chest: Effort normal and breath sounds normal. No stridor. No respiratory distress. She has no wheezes. She has no rales.  Abdominal: Soft. She exhibits no distension.  Musculoskeletal: Normal range of motion.  Neurological: She is alert and oriented to person, place, and time. She has normal strength.  Skin: Skin is warm and dry. She is not diaphoretic. No erythema.  10 cm area of erythema with central 2 cm area of induration, bruising centrally, no fluctuance.  Psychiatric: She has a normal mood and affect. Her behavior is normal.    ED Course   Procedures (including critical care time)  DIAGNOSTIC STUDIES: Oxygen Saturation is 99% on RA, normal by my interpretation.    COORDINATION OF CARE: 7:11 PM- Pt advised of plan to receive an ultrasound to determine if drainage is  indicated or possible, along with plan to receive antibiotics and pt agrees.  Labs Reviewed - No data to display  No results found.  1. Abscess     MDM  Beside ultrasound done and there is no drainable abscess. Due to the amount of surrounding erythema I will treat with bactrim and keflex. She is afebrile with no systemic complaints. Follow up with PCP. Call Monday for an appointment next week. Return instructions given. Vital signs stable for discharge. Patient / Family / Caregiver informed of clinical course, understand medical decision-making process, and agree with plan.      I personally performed the services described in this documentation, which was scribed in my presence. The recorded information has been reviewed and is accurate.    Mora Bellman, PA-C 09/25/12 [phone]

## 2012-09-24 NOTE — ED Notes (Signed)
Rt. Upper thigh abscess

## 2012-09-28 NOTE — ED Provider Notes (Signed)
Medical screening examination/treatment/procedure(s) were performed by non-physician practitioner and as supervising physician I was immediately available for consultation/collaboration.   Geneva Barrero E Corrion Stirewalt, MD 09/28/12 0839 

## 2012-12-10 ENCOUNTER — Emergency Department (HOSPITAL_BASED_OUTPATIENT_CLINIC_OR_DEPARTMENT_OTHER): Payer: Self-pay

## 2012-12-10 ENCOUNTER — Encounter (HOSPITAL_BASED_OUTPATIENT_CLINIC_OR_DEPARTMENT_OTHER): Payer: Self-pay | Admitting: Emergency Medicine

## 2012-12-10 ENCOUNTER — Emergency Department (HOSPITAL_BASED_OUTPATIENT_CLINIC_OR_DEPARTMENT_OTHER)
Admission: EM | Admit: 2012-12-10 | Discharge: 2012-12-10 | Disposition: A | Discharge status: Home / Self Care | Payer: Self-pay | Attending: Emergency Medicine | Admitting: Emergency Medicine

## 2012-12-10 DIAGNOSIS — Z792 Long term (current) use of antibiotics: Secondary | ICD-10-CM | POA: Insufficient documentation

## 2012-12-10 DIAGNOSIS — Z3202 Encounter for pregnancy test, result negative: Secondary | ICD-10-CM | POA: Insufficient documentation

## 2012-12-10 DIAGNOSIS — Y929 Unspecified place or not applicable: Secondary | ICD-10-CM | POA: Insufficient documentation

## 2012-12-10 DIAGNOSIS — X500XXA Overexertion from strenuous movement or load, initial encounter: Secondary | ICD-10-CM | POA: Insufficient documentation

## 2012-12-10 DIAGNOSIS — Z880 Allergy status to penicillin: Secondary | ICD-10-CM | POA: Insufficient documentation

## 2012-12-10 DIAGNOSIS — Y939 Activity, unspecified: Secondary | ICD-10-CM | POA: Insufficient documentation

## 2012-12-10 DIAGNOSIS — S335XXA Sprain of ligaments of lumbar spine, initial encounter: Secondary | ICD-10-CM | POA: Insufficient documentation

## 2012-12-10 DIAGNOSIS — S39012A Strain of muscle, fascia and tendon of lower back, initial encounter: Secondary | ICD-10-CM

## 2012-12-10 LAB — URINALYSIS, ROUTINE W REFLEX MICROSCOPIC
Glucose, UA: NEGATIVE mg/dL
Leukocytes, UA: NEGATIVE
Nitrite: NEGATIVE
Protein, ur: NEGATIVE mg/dL
Urobilinogen, UA: 0.2 mg/dL (ref 0.0–1.0)

## 2012-12-10 LAB — PREGNANCY, URINE: Preg Test, Ur: NEGATIVE

## 2012-12-10 MED ORDER — OXYCODONE-ACETAMINOPHEN 5-325 MG PO TABS: 2.0000 | ORAL_TABLET | ORAL | Status: DC | PRN

## 2012-12-10 MED ORDER — PROMETHAZINE HCL 25 MG RE SUPP: 25.0000 mg | Freq: Four times a day (QID) | RECTAL | Status: DC | PRN

## 2012-12-10 MED ORDER — PROMETHAZINE HCL 25 MG PO TABS: 25.0000 mg | ORAL_TABLET | Freq: Four times a day (QID) | ORAL | Status: DC | PRN

## 2012-12-10 MED ORDER — CYCLOBENZAPRINE HCL 10 MG PO TABS: 10.0000 mg | ORAL_TABLET | Freq: Two times a day (BID) | ORAL | Status: DC | PRN

## 2012-12-10 NOTE — ED Notes (Signed)
Mid back pain with radiation into her right upper quad and nausea. States a year ago she had similar episode and was suppose to follow up with a GI but she did not due to no insurance.

## 2012-12-10 NOTE — ED Provider Notes (Signed)
CSN: [id number]     Arrival date & time 12/10/12  1003 History   First MD Initiated Contact with Patient 12/10/12 1116     Chief Complaint  Patient presents with  . Back Pain   (Consider location/radiation/quality/duration/timing/severity/associated sxs/prior Treatment) Patient is a 32 y.o. female presenting with back pain.  Back Pain Location:  Lumbar spine Quality:  Aching Radiates to: right upper quadrant. Pain severity:  Moderate Pain is:  Same all the time Onset quality:  Gradual Timing:  Constant Chronicity:  New Context: lifting heavy objects   Relieved by:  Nothing Worsened by:  Movement Ineffective treatments:  None tried Associated symptoms: no abdominal pain     History reviewed. No pertinent past medical history. Past Surgical History  Procedure Laterality Date  . Cesarean section    . Tonsillectomy     No family history on file. History  Substance Use Topics  . Smoking status: Never Smoker   . Smokeless tobacco: Not on file  . Alcohol Use: No   OB History   Grav Para Term Preterm Abortions TAB SAB Ect Mult Living                 Review of Systems  Gastrointestinal: Negative for abdominal pain.  Musculoskeletal: Positive for back pain.  All other systems reviewed and are negative.    Allergies  Asa; Penicillins; and Sudafed  Home Medications   Current Outpatient Rx  Name  Route  Sig  Dispense  Refill  . cephALEXin (KEFLEX) 500 MG capsule   Oral   Take 1 capsule (500 mg total) by mouth 4 (four) times daily.   40 capsule   0   . Ibuprofen (IBU PO)   Oral   Take 4 tablets by mouth every 6 (six) hours as needed (pain).         . phentermine (ADIPEX-P) 37.5 MG tablet   Oral   Take 37.5 mg by mouth daily before breakfast.         . sulfamethoxazole-trimethoprim (SEPTRA DS) 800-160 MG per tablet   Oral   Take 1 tablet by mouth every 12 (twelve) hours.   20 tablet   0    BP 130/91  Pulse 109  Temp(Src) 98.7 F (37.1 C) (Oral)   Resp 14  SpO2 100%  LMP 12/06/2012 Physical Exam  Nursing note and vitals reviewed. Constitutional: She is oriented to person, place, and time. She appears well-developed and well-nourished.  Obese  HENT:  Head: Normocephalic and atraumatic.  Right Ear: Tympanic membrane and external ear normal.  Left Ear: Tympanic membrane and external ear normal.  Nose: Nose normal. Right sinus exhibits no maxillary sinus tenderness and no frontal sinus tenderness. Left sinus exhibits no maxillary sinus tenderness and no frontal sinus tenderness.  Eyes: Conjunctivae and EOM are normal. Pupils are equal, round, and reactive to light. Right eye exhibits no nystagmus. Left eye exhibits no nystagmus.  Neck: Normal range of motion. Neck supple.  Cardiovascular: Normal rate, regular rhythm, normal heart sounds and intact distal pulses.   Pulmonary/Chest: Effort normal and breath sounds normal. No respiratory distress. She exhibits no tenderness.  Abdominal: Soft. Bowel sounds are normal. She exhibits no distension and no mass. There is tenderness.  Patient with some tenderness to palpation in right upper quadrant and epigastrium.  Musculoskeletal: Normal range of motion. She exhibits no edema and no tenderness.  No tenderness to palpation over her thoracic or lumbar spine. No CVA tenderness.  Neurological: She is alert  and oriented to person, place, and time. She has normal strength and normal reflexes. No sensory deficit. She displays a negative Romberg sign. GCS eye subscore is 4. GCS verbal subscore is 5. GCS motor subscore is 6.  Reflex Scores:      Tricep reflexes are 2+ on the right side and 2+ on the left side.      Bicep reflexes are 2+ on the right side and 2+ on the left side.      Brachioradialis reflexes are 2+ on the right side and 2+ on the left side.      Patellar reflexes are 2+ on the right side and 2+ on the left side.      Achilles reflexes are 2+ on the right side and 2+ on the left  side. Patient with normal gait without ataxia, shuffling, spasm, or antalgia. Speech is normal without dysarthria, dysphasia, or aphasia. Muscle strength is 5/5 in bilateral shoulders, elbow flexor and extensors, wrist flexor and extensors, and intrinsic hand muscles. 5/5 bilateral lower extremity hip flexors, extensors, knee flexors and extensors, and ankle dorsi and plantar flexors.    Skin: Skin is warm and dry. No rash noted.  Psychiatric: She has a normal mood and affect. Her behavior is normal. Judgment and thought content normal.    ED Course  Procedures (including critical care time) Labs Review Labs Reviewed  URINALYSIS, ROUTINE W REFLEX MICROSCOPIC  PREGNANCY, URINE   Imaging Review US Abdomen Complete  12/10/2012   CLINICAL DATA:  Mid back pain radiating to the right upper quadrant. Nausea.  EXAM: ULTRASOUND ABDOMEN COMPLETE  COMPARISON:  CT, 12/11/2010. Ultrasound, 12/07/2010.  FINDINGS: Gallbladder  No gallstones or wall thickening. Negative sonographic Murphy's sign.  Common bile duct  Diameter: 3 mm  Liver  Liver is mildly echogenic suggesting fatty infiltration. Is normal in size. No liver mass or focal lesion.  IVC  Limited evaluation. No gross abnormality.  Pancreas  Visualized portion unremarkable.  Spleen  Size and appearance within normal limits.  Right Kidney  Length: 12.2 cm. Echogenicity within normal limits. No mass or hydronephrosis visualized.  Left Kidney  Length: 11.1 cm. Echogenicity within normal limits. No mass or hydronephrosis visualized.  Abdominal aorta  Limited visualization. No aneurysm is seen.  IMPRESSION: 1. No acute findings. 2. Mild increased liver echogenicity suggests fatty infiltration. 3. Limited visualization of the midline structures. 4. Exam otherwise unremarkable.   Electronically Signed   By: Amie Portland M.D.   On: 12/10/2012 12:16    EKG Interpretation   None      Results for orders placed during the hospital encounter of 12/10/12   URINALYSIS, ROUTINE W REFLEX MICROSCOPIC      Result Value Range   Color, Urine YELLOW  YELLOW   APPearance CLEAR  CLEAR   Specific Gravity, Urine 1.025  1.005 - 1.030   pH 6.0  5.0 - 8.0   Glucose, UA NEGATIVE  NEGATIVE mg/dL   Hgb urine dipstick NEGATIVE  NEGATIVE   Bilirubin Urine NEGATIVE  NEGATIVE   Ketones, ur NEGATIVE  NEGATIVE mg/dL   Protein, ur NEGATIVE  NEGATIVE mg/dL   Urobilinogen, UA 0.2  0.0 - 1.0 mg/dL   Nitrite NEGATIVE  NEGATIVE   Leukocytes, UA NEGATIVE  NEGATIVE  PREGNANCY, URINE      Result Value Range   Preg Test, Ur NEGATIVE  NEGATIVE    MDM    32 year old feel female with some low back pain without definitive trauma who presents today because  the pain has been ongoing it does not appear to be improving the past week. On her exam she had some right upper quadrant pain. She does describe her back pain is radiating around to the right upper quadrant. Secondary to this she had an ultrasound of her gallbladder which did not show any stones or acute cholecystitis. Urine does not show any hematuria and decreases my suspicion for kidney stone. Plan now that gallbladder disease has been ruled out, there is no hematuria, normal neurological exam is to treat the patient for musculoskeletal back pain. She is given strict return precautions  Hilario Quarry, MD 12/10/12 1233

## 2013-12-27 ENCOUNTER — Emergency Department (HOSPITAL_BASED_OUTPATIENT_CLINIC_OR_DEPARTMENT_OTHER): Payer: No Typology Code available for payment source

## 2013-12-27 ENCOUNTER — Emergency Department (HOSPITAL_BASED_OUTPATIENT_CLINIC_OR_DEPARTMENT_OTHER)
Admission: EM | Admit: 2013-12-27 | Discharge: 2013-12-27 | Disposition: A | Discharge status: Home / Self Care | Payer: No Typology Code available for payment source | Attending: Emergency Medicine | Admitting: Emergency Medicine

## 2013-12-27 ENCOUNTER — Encounter (HOSPITAL_BASED_OUTPATIENT_CLINIC_OR_DEPARTMENT_OTHER): Payer: Self-pay | Admitting: Emergency Medicine

## 2013-12-27 DIAGNOSIS — S39012A Strain of muscle, fascia and tendon of lower back, initial encounter: Secondary | ICD-10-CM | POA: Diagnosis not present

## 2013-12-27 DIAGNOSIS — S199XXA Unspecified injury of neck, initial encounter: Secondary | ICD-10-CM | POA: Diagnosis not present

## 2013-12-27 DIAGNOSIS — Y92410 Unspecified street and highway as the place of occurrence of the external cause: Secondary | ICD-10-CM | POA: Insufficient documentation

## 2013-12-27 DIAGNOSIS — Z880 Allergy status to penicillin: Secondary | ICD-10-CM | POA: Diagnosis not present

## 2013-12-27 DIAGNOSIS — S3992XA Unspecified injury of lower back, initial encounter: Secondary | ICD-10-CM | POA: Diagnosis present

## 2013-12-27 DIAGNOSIS — Z792 Long term (current) use of antibiotics: Secondary | ICD-10-CM | POA: Diagnosis not present

## 2013-12-27 DIAGNOSIS — Z79899 Other long term (current) drug therapy: Secondary | ICD-10-CM | POA: Diagnosis not present

## 2013-12-27 DIAGNOSIS — Y9389 Activity, other specified: Secondary | ICD-10-CM | POA: Diagnosis not present

## 2013-12-27 DIAGNOSIS — R51 Headache: Secondary | ICD-10-CM | POA: Diagnosis not present

## 2013-12-27 MED ORDER — OXYCODONE-ACETAMINOPHEN 5-325 MG PO TABS: 1.0000 | ORAL_TABLET | Freq: Four times a day (QID) | ORAL | Status: DC | PRN

## 2013-12-27 MED ORDER — CYCLOBENZAPRINE HCL 10 MG PO TABS: 10.0000 mg | ORAL_TABLET | Freq: Two times a day (BID) | ORAL | Status: DC | PRN

## 2013-12-27 MED ORDER — OXYCODONE-ACETAMINOPHEN 5-325 MG PO TABS
1.0000 | ORAL_TABLET | Freq: Once | ORAL | Status: AC
Start: 2013-12-27 — End: 2013-12-27
  Administered 2013-12-27: 1 via ORAL
  Filled 2013-12-27: qty 1
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
## 2014-01-13 ENCOUNTER — Encounter (HOSPITAL_COMMUNITY): Payer: Self-pay | Admitting: Emergency Medicine

## 2014-01-13 ENCOUNTER — Emergency Department (HOSPITAL_COMMUNITY)
Admission: EM | Admit: 2014-01-13 | Discharge: 2014-01-13 | Disposition: A | Discharge status: Home / Self Care | Payer: No Typology Code available for payment source | Attending: Emergency Medicine | Admitting: Emergency Medicine

## 2014-01-13 DIAGNOSIS — Y92410 Unspecified street and highway as the place of occurrence of the external cause: Secondary | ICD-10-CM | POA: Diagnosis not present

## 2014-01-13 DIAGNOSIS — Z792 Long term (current) use of antibiotics: Secondary | ICD-10-CM | POA: Insufficient documentation

## 2014-01-13 DIAGNOSIS — Y9389 Activity, other specified: Secondary | ICD-10-CM | POA: Insufficient documentation

## 2014-01-13 DIAGNOSIS — S79912A Unspecified injury of left hip, initial encounter: Secondary | ICD-10-CM | POA: Diagnosis not present

## 2014-01-13 DIAGNOSIS — Z880 Allergy status to penicillin: Secondary | ICD-10-CM | POA: Insufficient documentation

## 2014-01-13 DIAGNOSIS — S39012A Strain of muscle, fascia and tendon of lower back, initial encounter: Secondary | ICD-10-CM

## 2014-01-13 DIAGNOSIS — S3992XA Unspecified injury of lower back, initial encounter: Secondary | ICD-10-CM | POA: Diagnosis present

## 2014-01-13 DIAGNOSIS — Y998 Other external cause status: Secondary | ICD-10-CM | POA: Diagnosis not present

## 2014-01-13 DIAGNOSIS — Z79899 Other long term (current) drug therapy: Secondary | ICD-10-CM | POA: Insufficient documentation

## 2014-01-13 MED ORDER — NAPROXEN 500 MG PO TABS: 500.0000 mg | ORAL_TABLET | Freq: Two times a day (BID) | ORAL | Status: DC

## 2014-01-13 MED ORDER — CYCLOBENZAPRINE HCL 10 MG PO TABS: 10.0000 mg | ORAL_TABLET | Freq: Two times a day (BID) | ORAL | Status: DC | PRN
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
## 2014-01-13 NOTE — Discharge Instructions (Signed)

## 2014-01-13 NOTE — ED Notes (Signed)
Pt alert, nad, resp even unlabored, skin pwd, denies needs, ambulates to discharge 
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
## 2014-09-14 ENCOUNTER — Encounter (HOSPITAL_COMMUNITY): Payer: Self-pay

## 2014-09-14 ENCOUNTER — Emergency Department (HOSPITAL_COMMUNITY)
Admission: EM | Admit: 2014-09-14 | Discharge: 2014-09-15 | Disposition: A | Discharge status: Home / Self Care | Payer: 59 | Attending: Emergency Medicine | Admitting: Emergency Medicine

## 2014-09-14 DIAGNOSIS — G43809 Other migraine, not intractable, without status migrainosus: Secondary | ICD-10-CM | POA: Diagnosis not present

## 2014-09-14 DIAGNOSIS — Z880 Allergy status to penicillin: Secondary | ICD-10-CM | POA: Diagnosis not present

## 2014-09-14 DIAGNOSIS — R51 Headache: Secondary | ICD-10-CM | POA: Diagnosis present

## 2014-09-14 DIAGNOSIS — Z79899 Other long term (current) drug therapy: Secondary | ICD-10-CM | POA: Diagnosis not present

## 2014-09-14 NOTE — ED Notes (Addendum)
Patient reports she has had a headache x 3 days.  Unrelieved with ibuprofen, tylenol, percocet, or xanax. 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  Currently on her menstrual cycle, which she reports causes a headache each month as well.

## 2014-09-15 MED ORDER — DIPHENHYDRAMINE HCL 50 MG/ML IJ SOLN
25.0000 mg | Freq: Once | INTRAMUSCULAR | Status: AC
  Administered 2014-09-15: 25 mg via INTRAVENOUS
  Filled 2014-09-15: qty 1

## 2014-09-15 MED ORDER — SODIUM CHLORIDE 0.9 % IV BOLUS (SEPSIS)
1000.0000 mL | Freq: Once | INTRAVENOUS | Status: AC
  Administered 2014-09-15: 1000 mL via INTRAVENOUS

## 2014-09-15 MED ORDER — METOCLOPRAMIDE HCL 5 MG/ML IJ SOLN
10.0000 mg | Freq: Once | INTRAMUSCULAR | Status: AC
  Administered 2014-09-15: 10 mg via INTRAVENOUS
  Filled 2014-09-15: qty 2

## 2014-09-15 MED ORDER — KETOROLAC TROMETHAMINE 30 MG/ML IJ SOLN
30.0000 mg | Freq: Once | INTRAMUSCULAR | Status: AC
  Administered 2014-09-15: 30 mg via INTRAVENOUS
  Filled 2014-09-15: qty 1

## 2014-09-15 NOTE — ED Provider Notes (Signed)
CSN: [id number]     Arrival date & time 09/14/14  2250 History   First MD Initiated Contact with Patient 09/15/14 0000     Chief Complaint  Patient presents with  . Headache     (Consider location/radiation/quality/duration/timing/severity/associated sxs/prior Treatment) HPI Comments: Patient is a 34 year old female with a past medical history of pseudotumor cerebri who presents with a headache for 3 days. Patient reports a gradual onset and progressive worsening of the headache. The pain is sharp, constant and is located in generalized head without radiation. Patient has tried percocet, ibuprofen, tylenol, and xanax for symptoms without relief. No alleviating/aggravating factors. Patient reports associated nausea and photophobia. Patient denies fever, vomiting, diarrhea, numbness/tingling, weakness, visual changes, congestion, chest pain, SOB, abdominal pain.      History reviewed. No pertinent past medical history. Past Surgical History  Procedure Laterality Date  . Cesarean section    . Tonsillectomy     No family history on file. History  Substance Use Topics  . Smoking status: Never Smoker   . Smokeless tobacco: Not on file  . Alcohol Use: No   OB History    No data available     Review of Systems  Constitutional: Negative for fever, chills and fatigue.  HENT: Negative for trouble swallowing.   Eyes: Negative for visual disturbance.  Respiratory: Negative for shortness of breath.   Cardiovascular: Negative for chest pain and palpitations.  Gastrointestinal: Negative for nausea, vomiting, abdominal pain and diarrhea.  Genitourinary: Negative for dysuria and difficulty urinating.  Musculoskeletal: Negative for arthralgias and neck pain.  Skin: Negative for color change.  Neurological: Positive for headaches. Negative for dizziness and weakness.  Psychiatric/Behavioral: Negative for dysphoric mood.      Allergies  Asa; Macrobid; Naproxen; Penicillins; and  Sudafed  Home Medications   Prior to Admission medications   Medication Sig Start Date End Date Taking? Authorizing Provider  acetaminophen (TYLENOL) 500 MG tablet Take 1,000 mg by mouth every 6 (six) hours as needed for mild pain.   Yes Historical Provider, MD  ADDERALL XR 20 MG 24 hr capsule Take 1 capsule by mouth daily. 09/11/14  Yes Historical Provider, MD  ALPRAZolam Prudy Feeler(XANAX) 0.5 MG tablet Take 0.25-0.5 tablets by mouth 2 (two) times daily as needed. 09/06/14  Yes Historical Provider, MD  pantoprazole (PROTONIX) 40 MG tablet Take 1 tablet by mouth daily. 07/17/14  Yes Historical Provider, MD  cephALEXin (KEFLEX) 500 MG capsule Take 1 capsule (500 mg total) by mouth 4 (four) times daily. Patient not taking: Reported on 09/15/2014 09/24/12   Junious SilkHannah Merrell, PA-C  cyclobenzaprine (FLEXERIL) 10 MG tablet Take 1 tablet (10 mg total) by mouth 2 (two) times daily as needed for muscle spasms. Patient not taking: Reported on 09/15/2014 01/13/14   Fayrene HelperBowie Tran, PA-C  naproxen (NAPROSYN) 500 MG tablet Take 1 tablet (500 mg total) by mouth 2 (two) times daily. Patient not taking: Reported on 09/15/2014 01/13/14   Fayrene HelperBowie Tran, PA-C  promethazine (PHENERGAN) 25 MG suppository Place 1 suppository (25 mg total) rectally every 6 (six) hours as needed for nausea. Patient not taking: Reported on 09/15/2014 12/10/12   Margarita Grizzleanielle Ray, MD  promethazine (PHENERGAN) 25 MG tablet Take 1 tablet (25 mg total) by mouth every 6 (six) hours as needed for nausea. Patient not taking: Reported on 09/15/2014 12/10/12   Margarita Grizzleanielle Ray, MD  sulfamethoxazole-trimethoprim (SEPTRA DS) 800-160 MG per tablet Take 1 tablet by mouth every 12 (twelve) hours. Patient not taking: Reported on 09/15/2014 09/24/12  Junious Silk, PA-C   BP 150/118 mmHg  Pulse 85  Temp(Src) 98.2 F (36.8 C) (Oral)  Resp 18  SpO2 100%  LMP 09/12/2014 (Exact Date) Physical Exam  Constitutional: She is oriented to person, place, and time. She appears well-developed  and well-nourished. No distress.  HENT:  Head: Normocephalic and atraumatic.  Eyes: Conjunctivae and EOM are normal.  Neck: Normal range of motion.  Cardiovascular: Normal rate and regular rhythm.  Exam reveals no gallop and no friction rub.   No murmur heard. Pulmonary/Chest: Effort normal and breath sounds normal. She has no wheezes. She has no rales. She exhibits no tenderness.  Abdominal: Soft. She exhibits no distension. There is no tenderness. There is no rebound.  Musculoskeletal: Normal range of motion.  Neurological: She is alert and oriented to person, place, and time. No cranial nerve deficit. Coordination normal.  Speech is goal-oriented. Moves limbs without ataxia.   Skin: Skin is warm and dry.  Psychiatric: She has a normal mood and affect. Her behavior is normal.  Nursing note and vitals reviewed.   ED Course  Procedures (including critical care time) Labs Review Labs Reviewed - No data to display  Imaging Review No results found.   EKG Interpretation None      MDM   Final diagnoses:  Other migraine without status migrainosus, not intractable    2:11 AM Patient feeling better after migraine cocktail. No neuro deficits or meningeal signs. Vitals stable and patient afebrile.     Emilia Beck, PA-C 09/15/14 0217  April Palumbo, MD 09/15/14 [id number]

## 2015-05-15 ENCOUNTER — Other Ambulatory Visit: Payer: Self-pay | Admitting: Obstetrics and Gynecology

## 2015-06-05 ENCOUNTER — Other Ambulatory Visit (HOSPITAL_COMMUNITY): Payer: Self-pay

## 2015-06-05 NOTE — Patient Instructions (Addendum)
Your procedure is scheduled on:  Tuesday, June 19, 2015  [facility]:  Pick up the phone at the desk and dial [phone].  Call this number if you have problems the morning of surgery: [phone].  Remember: Do NOT eat food or drink after:  Midnight Monday, June 18, 2015  Take these medicines the morning of surgery with a SIP OF WATER: Protonix, celexa and xanax if needed  Do NOT wear jewelry (body piercing), metal hair clips/bobby pins, make-up, or nail polish. Do NOT wear lotions, powders, or perfumes.  You may wear deodorant. Do NOT shave for 48 hours prior to surgery. Do NOT bring valuables to the hospital. Contacts, dentures, or bridgework may not be worn into surgery.  Leave suitcase in car.  After surgery it may be brought to your room.  For patients admitted to the hospital, checkout time is 11:00 AM the day of discharge.   Home with husband Kathlene NovemberMike cell [phone].

## 2015-06-06 ENCOUNTER — Encounter (HOSPITAL_COMMUNITY)
Admission: RE | Admit: 2015-06-06 | Discharge: 2015-06-06 | Disposition: A | Discharge status: Home / Self Care | Payer: BLUE CROSS/BLUE SHIELD | Source: Ambulatory Visit | Attending: Obstetrics and Gynecology | Admitting: Obstetrics and Gynecology

## 2015-06-06 ENCOUNTER — Encounter (HOSPITAL_COMMUNITY): Payer: Self-pay

## 2015-06-06 ENCOUNTER — Other Ambulatory Visit (HOSPITAL_COMMUNITY): Payer: Self-pay | Admitting: Obstetrics and Gynecology

## 2015-06-06 DIAGNOSIS — Z01812 Encounter for preprocedural laboratory examination: Secondary | ICD-10-CM | POA: Diagnosis present

## 2015-06-06 DIAGNOSIS — N920 Excessive and frequent menstruation with regular cycle: Secondary | ICD-10-CM | POA: Insufficient documentation

## 2015-06-06 DIAGNOSIS — N946 Dysmenorrhea, unspecified: Secondary | ICD-10-CM | POA: Insufficient documentation

## 2015-06-06 HISTORY — DX: Major depressive disorder, single episode, unspecified: F32.9

## 2015-06-06 HISTORY — DX: Headache: R51

## 2015-06-06 HISTORY — DX: Depression, unspecified: F32.A

## 2015-06-06 HISTORY — DX: Headache, unspecified: R51.9

## 2015-06-06 HISTORY — DX: Gastro-esophageal reflux disease without esophagitis: K21.9

## 2015-06-06 HISTORY — DX: Anxiety disorder, unspecified: F41.9

## 2015-06-06 HISTORY — DX: Attention-deficit hyperactivity disorder, unspecified type: F90.9

## 2015-06-06 LAB — CBC
HEMATOCRIT: 34.9 % — AB (ref 36.0–46.0)
Hemoglobin: 11.2 g/dL — ABNORMAL LOW (ref 12.0–15.0)
MCH: 28.2 pg (ref 26.0–34.0)
MCHC: 32.1 g/dL (ref 30.0–36.0)
MCV: 87.9 fL (ref 78.0–100.0)
PLATELETS: 392 10*3/uL (ref 150–400)
RBC: 3.97 MIL/uL (ref 3.87–5.11)
RDW: 15.9 % — ABNORMAL HIGH (ref 11.5–15.5)
WBC: 7.1 10*3/uL (ref 4.0–10.5)

## 2015-06-06 LAB — TYPE AND SCREEN
ABO/RH(D): O POS
ANTIBODY SCREEN: NEGATIVE

## 2015-06-06 NOTE — H&P (Signed)
[name] is a 35 y.o. female P: 2-0-0-2 who presents for hysterectomy because of menorrhagia and dysmenorrhea. Since menarche the patient states that her menstrual periods have been heavy with significant cramping.  Over the past seven years, however, she believes that her symptoms have gotten progressively worse.  She has never used any hormonal management nor Lysteda for her bleeding   though she is aware that both may have a positive  impact.  Instead she endures a 7-9 day flow in  which  she will use 120 super plus tampons because of heavy clotting.  At  times she has to change her protection every 45 minutes.   She admits cramping that she rates as 9/10 on a 10 point pain scale and  over the counter analgesia provides no relief.  The only relief she will get is by  applying lateral pressure on her buttock area as most of her cramping is felt in her back.   She goes on to report that 1 week before her period she will have rectal spasms with bowel movements and deep dyspareunia that is not present at other times.  She denies any changes  in urinary function.  An endometrial biopsy in January 2017 showed benign proliferative endometrium with no hyperplasia or malignancy.  A pelvic ultrasound in November 2016 showed:  anteverted uterus-6.79 x 5.85 x 5.01 cm,  left ovary-2.5 x 2.0 x 2.0 cm and right ovary-2.5 x 1.6 x 1.6 cm.  A reveiw of both medical and surgical management options were given to the patient.  After considering that she has completed childbearing along with the disruptive and debilitating nature of her menstrual symptoms,  the patient has decided to proceed with definitive therapy in the form of hysterectomy   Past Medical History  OB History: G: 2;  P: 2-0-0-2;  C-section 2007 and 2009  GYN History: menarche: 35YO    LMP: 06/04/2015    Contracepton: Vasectomy; Denies history of STD or abnormal PAP smear.  Last PAP smear-2016, normal  Medical History: Anxiety Disorder,  Depression.   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 Asa [Aspirin]     pain  . Macrobid [employer][Nitrofurantoin Monohyd Macro] Other (See Comments)    Stomach pain  . Naproxen Nausea And Vomiting and Other (See Comments)    cramping  . Penicillins Hives    Has patient had a PCN reaction causing immediate rash, facial/tongue/throat swelling, SOB or lightheadedness with hypotension: No Has patient had a PCN reaction causing severe rash involving mucus membranes or skin necrosis: No Has patient had a PCN reaction that required hospitalization No Has patient had a PCN reaction occurring within the last 10 years: No If all of the above answers are "NO", then may proceed with Cephalosporin use.  Lyman Bishop. Sudafed [Pseudoephedrine Hcl] Hives    ROS: Admits to glasses, upper middle jaw partial dental plate, weekly headaches (due to migraines and pseudo-tumor cerebri), occasional bilateral  pedal  edema, nausea with menses,  but  denies  vision changes, nasal congestion, dysphagia, tinnitus, dizziness, hoarseness, cough,  chest pain, shortness of breath, vomiting, diarrhea,constipation,  urinary frequency, urgency  dysuria, hematuria, vaginitis symptoms,  swelling of joints,easy bruising,  myalgias, arthralgias, unexplained weight loss and except as is mentioned in the history of present illness, patient's review of systems is otherwise negative.   Physical  Exam  Bp: 110/70  P: 80    R: 16  Weight: 253 lbs.  Height: 5'5"  BMI: 42.1  Neck: supple without masses or thyromegaly Lungs: clear to auscultation Heart: regular rate and rhythm Abdomen: soft, non-tender and no organomegaly Pelvic:EGBUS- wnl; vagina-normal rugae; uterus-normal size, cervix without lesions or motion tenderness; adnexae-no tenderness or masses Extremities:  no clubbing, cyanosis or edema   Assesment:  Menorrhagia            Severe Dysmenorrhea   Disposition: Reviewed with the patient the indications  for her procedures along with the the risks of surgery to include, but not limited to: reaction to anesthesia, damage to adjacent organs, infection, possible need for an open abdominal incision and excessive bleeding. The patient verbalized understanding of these risks and has consented to proceed with an Laparoscopically Assisted Vaginal Hysterectomy, Bilateral Salpingectomy and Cystoscopy with Possible Abdominal Hysterectomy  at [facility] [place] on June 19, 2015 at 9 a.m.    CSN# [id number]   Jenella Craigie J. Lowell Guitar, PA-C  for Dr. Pierre Bali. Dillard

## 2015-06-18 MED ORDER — GENTAMICIN SULFATE 40 MG/ML IJ SOLN
INTRAVENOUS | Status: AC
  Administered 2015-06-19: 100 mL via INTRAVENOUS
  Filled 2015-06-18: qty 9.75

## 2015-06-18 NOTE — Anesthesia Preprocedure Evaluation (Addendum)
Anesthesia Evaluation  Patient identified by MRN, date of birth, ID band Patient awake    Reviewed: Allergy & Precautions, NPO status , Patient's Chart, lab work & pertinent test results  Airway Mallampati: II       Dental  (+) Partial Upper, Dental Advisory Given   Pulmonary neg pulmonary ROS,    breath sounds clear to auscultation       Cardiovascular negative cardio ROS   Rhythm:Regular     Neuro/Psych Anxiety Depression ADHD on Adderall  negative neurological ROS  negative psych ROS   GI/Hepatic negative GI ROS, Neg liver ROS, GERD  ,  Endo/Other  negative endocrine ROSMorbid obesity  Renal/GU negative Renal ROS  negative genitourinary   Musculoskeletal negative musculoskeletal ROS (+)   Abdominal (+) + obese,   Peds negative pediatric ROS (+)  Hematology negative hematology ROS (+) 12/35   Anesthesia Other Findings   Reproductive/Obstetrics negative OB ROS                            Anesthesia Physical Anesthesia Plan  ASA: II  Anesthesia Plan: General   Post-op Pain Management:    Induction: Intravenous  Airway Management Planned: Oral ETT  Additional Equipment:   Intra-op Plan:   Post-operative Plan: Extubation in OR  Informed Consent: I have reviewed the patients History and Physical, chart, labs and discussed the procedure including the risks, benefits and alternatives for the proposed anesthesia with the patient or authorized representative who has indicated his/her understanding and acceptance.     Plan Discussed with:   Anesthesia Plan Comments: (Glide available, Precedex be nice up to 100mcg total)        Anesthesia Quick Evaluation

## 2015-06-19 ENCOUNTER — Encounter (HOSPITAL_COMMUNITY)
Admission: AD | Disposition: A | Discharge status: Home / Self Care | Payer: Self-pay | Source: Ambulatory Visit | Attending: Obstetrics and Gynecology

## 2015-06-19 ENCOUNTER — Ambulatory Visit (HOSPITAL_COMMUNITY): Payer: BLUE CROSS/BLUE SHIELD | Admitting: Anesthesiology

## 2015-06-19 ENCOUNTER — Encounter (HOSPITAL_COMMUNITY): Payer: Self-pay

## 2015-06-19 ENCOUNTER — Observation Stay (HOSPITAL_COMMUNITY)
Admission: AD | Admit: 2015-06-19 | Discharge: 2015-06-20 | Disposition: A | Discharge status: Home / Self Care | Payer: BLUE CROSS/BLUE SHIELD | Source: Ambulatory Visit | Attending: Obstetrics and Gynecology | Admitting: Obstetrics and Gynecology

## 2015-06-19 DIAGNOSIS — F909 Attention-deficit hyperactivity disorder, unspecified type: Secondary | ICD-10-CM | POA: Insufficient documentation

## 2015-06-19 DIAGNOSIS — Z886 Allergy status to analgesic agent status: Secondary | ICD-10-CM | POA: Insufficient documentation

## 2015-06-19 DIAGNOSIS — F329 Major depressive disorder, single episode, unspecified: Secondary | ICD-10-CM | POA: Insufficient documentation

## 2015-06-19 DIAGNOSIS — F419 Anxiety disorder, unspecified: Secondary | ICD-10-CM | POA: Diagnosis not present

## 2015-06-19 DIAGNOSIS — N946 Dysmenorrhea, unspecified: Secondary | ICD-10-CM | POA: Insufficient documentation

## 2015-06-19 DIAGNOSIS — Z881 Allergy status to other antibiotic agents status: Secondary | ICD-10-CM | POA: Diagnosis not present

## 2015-06-19 DIAGNOSIS — R102 Pelvic and perineal pain: Secondary | ICD-10-CM | POA: Diagnosis present

## 2015-06-19 DIAGNOSIS — N920 Excessive and frequent menstruation with regular cycle: Secondary | ICD-10-CM | POA: Diagnosis not present

## 2015-06-19 DIAGNOSIS — Z880 Allergy status to penicillin: Secondary | ICD-10-CM | POA: Insufficient documentation

## 2015-06-19 DIAGNOSIS — Z79899 Other long term (current) drug therapy: Secondary | ICD-10-CM | POA: Insufficient documentation

## 2015-06-19 DIAGNOSIS — K219 Gastro-esophageal reflux disease without esophagitis: Secondary | ICD-10-CM | POA: Insufficient documentation

## 2015-06-19 DIAGNOSIS — K589 Irritable bowel syndrome without diarrhea: Secondary | ICD-10-CM | POA: Insufficient documentation

## 2015-06-19 HISTORY — DX: Family history of other specified conditions: Z84.89

## 2015-06-19 HISTORY — PX: CYSTOSCOPY: SHX5120

## 2015-06-19 HISTORY — PX: LAPAROSCOPIC VAGINAL HYSTERECTOMY WITH SALPINGECTOMY: SHX6680

## 2015-06-19 LAB — PREGNANCY, URINE: PREG TEST UR: NEGATIVE

## 2015-06-19 SURGERY — HYSTERECTOMY, VAGINAL, LAPAROSCOPY-ASSISTED, WITH SALPINGECTOMY
Anesthesia: General | Site: Bladder

## 2015-06-19 MED ORDER — ONDANSETRON HCL 4 MG/2ML IJ SOLN
INTRAMUSCULAR | Status: AC
  Filled 2015-06-19: qty 2

## 2015-06-19 MED ORDER — FENTANYL CITRATE (PF) 250 MCG/5ML IJ SOLN
INTRAMUSCULAR | Status: AC
  Filled 2015-06-19: qty 5

## 2015-06-19 MED ORDER — SCOPOLAMINE 1 MG/3DAYS TD PT72
1.0000 | MEDICATED_PATCH | Freq: Once | TRANSDERMAL | Status: DC
  Administered 2015-06-19: 1.5 mg via TRANSDERMAL

## 2015-06-19 MED ORDER — FLUORESCEIN SODIUM 10 % IJ SOLN
INTRAMUSCULAR | Status: DC | PRN
  Administered 2015-06-19: 100 mg via INTRAVENOUS

## 2015-06-19 MED ORDER — LACTATED RINGERS IR SOLN
Status: DC | PRN
Start: 2015-06-19 — End: 2015-06-19
  Administered 2015-06-19: 3000 mL

## 2015-06-19 MED ORDER — ONDANSETRON HCL 4 MG/2ML IJ SOLN: 4.0000 mg | Freq: Once | INTRAMUSCULAR | Status: DC

## 2015-06-19 MED ORDER — VASOPRESSIN 20 UNIT/ML IV SOLN
INTRAVENOUS | Status: DC | PRN
  Administered 2015-06-19: 10 mL via INTRAMUSCULAR

## 2015-06-19 MED ORDER — AMPHETAMINE-DEXTROAMPHET ER 15 MG PO CP24: 15.0000 mg | ORAL_CAPSULE | ORAL | Status: DC

## 2015-06-19 MED ORDER — ONDANSETRON HCL 4 MG/2ML IJ SOLN: 4.0000 mg | Freq: Four times a day (QID) | INTRAMUSCULAR | Status: DC | PRN

## 2015-06-19 MED ORDER — NALOXONE HCL 0.4 MG/ML IJ SOLN: 0.4000 mg | INTRAMUSCULAR | Status: DC | PRN

## 2015-06-19 MED ORDER — LIDOCAINE HCL (CARDIAC) 20 MG/ML IV SOLN
INTRAVENOUS | Status: AC
  Filled 2015-06-19: qty 5

## 2015-06-19 MED ORDER — ONDANSETRON HCL 4 MG PO TABS: 4.0000 mg | ORAL_TABLET | Freq: Three times a day (TID) | ORAL | Status: DC | PRN

## 2015-06-19 MED ORDER — DEXMEDETOMIDINE HCL IN NACL 200 MCG/50ML IV SOLN
INTRAVENOUS | Status: DC | PRN
  Administered 2015-06-19 (×5): 10 ug via INTRAVENOUS

## 2015-06-19 MED ORDER — SODIUM CHLORIDE 0.9 % IJ SOLN
INTRAMUSCULAR | Status: AC
  Filled 2015-06-19: qty 100

## 2015-06-19 MED ORDER — DEXAMETHASONE SODIUM PHOSPHATE 4 MG/ML IJ SOLN
INTRAMUSCULAR | Status: AC
  Filled 2015-06-19: qty 1

## 2015-06-19 MED ORDER — FENTANYL CITRATE (PF) 100 MCG/2ML IJ SOLN
25.0000 ug | INTRAMUSCULAR | Status: DC | PRN
  Administered 2015-06-19 (×4): 50 ug via INTRAVENOUS

## 2015-06-19 MED ORDER — DEXMEDETOMIDINE HCL IN NACL 200 MCG/50ML IV SOLN
INTRAVENOUS | Status: AC
  Filled 2015-06-19: qty 50

## 2015-06-19 MED ORDER — HYDROMORPHONE HCL 1 MG/ML IJ SOLN
0.5000 mg | INTRAMUSCULAR | Status: AC | PRN
  Administered 2015-06-19 (×4): 0.5 mg via INTRAVENOUS

## 2015-06-19 MED ORDER — MIDAZOLAM HCL 2 MG/2ML IJ SOLN
INTRAMUSCULAR | Status: DC | PRN
  Administered 2015-06-19: 2 mg via INTRAVENOUS

## 2015-06-19 MED ORDER — ONDANSETRON HCL 4 MG/2ML IJ SOLN
INTRAMUSCULAR | Status: DC | PRN
  Administered 2015-06-19: 4 mg via INTRAVENOUS

## 2015-06-19 MED ORDER — NEOSTIGMINE METHYLSULFATE 10 MG/10ML IV SOLN
INTRAVENOUS | Status: AC
  Filled 2015-06-19: qty 1

## 2015-06-19 MED ORDER — LIDOCAINE HCL (CARDIAC) 20 MG/ML IV SOLN
INTRAVENOUS | Status: DC | PRN
  Administered 2015-06-19: 80 mg via INTRAVENOUS

## 2015-06-19 MED ORDER — DEXAMETHASONE SODIUM PHOSPHATE 10 MG/ML IJ SOLN
INTRAMUSCULAR | Status: DC | PRN
  Administered 2015-06-19: 4 mg via INTRAVENOUS

## 2015-06-19 MED ORDER — SODIUM CHLORIDE 0.9% FLUSH: 9.0000 mL | INTRAVENOUS | Status: DC | PRN

## 2015-06-19 MED ORDER — DIPHENHYDRAMINE HCL 50 MG/ML IJ SOLN: 12.5000 mg | Freq: Four times a day (QID) | INTRAMUSCULAR | Status: DC | PRN

## 2015-06-19 MED ORDER — SUCCINYLCHOLINE CHLORIDE 20 MG/ML IJ SOLN
INTRAMUSCULAR | Status: AC
  Filled 2015-06-19: qty 1

## 2015-06-19 MED ORDER — PROPOFOL 10 MG/ML IV BOLUS
INTRAVENOUS | Status: AC
  Filled 2015-06-19: qty 20

## 2015-06-19 MED ORDER — PANTOPRAZOLE SODIUM 40 MG PO TBEC
40.0000 mg | DELAYED_RELEASE_TABLET | Freq: Every day | ORAL | Status: DC
  Filled 2015-06-19: qty 1

## 2015-06-19 MED ORDER — IBUPROFEN 600 MG PO TABS
600.0000 mg | ORAL_TABLET | Freq: Four times a day (QID) | ORAL | Status: DC | PRN
  Administered 2015-06-20: 600 mg via ORAL
  Filled 2015-06-19: qty 1

## 2015-06-19 MED ORDER — KETOROLAC TROMETHAMINE 30 MG/ML IJ SOLN
30.0000 mg | Freq: Four times a day (QID) | INTRAMUSCULAR | Status: AC
  Administered 2015-06-19 – 2015-06-20 (×3): 30 mg via INTRAVENOUS
  Filled 2015-06-19 (×3): qty 1

## 2015-06-19 MED ORDER — BUPIVACAINE HCL (PF) 0.25 % IJ SOLN
INTRAMUSCULAR | Status: AC
  Filled 2015-06-19: qty 30

## 2015-06-19 MED ORDER — CITALOPRAM HYDROBROMIDE 20 MG PO TABS
20.0000 mg | ORAL_TABLET | Freq: Every day | ORAL | Status: DC
  Administered 2015-06-20: 20 mg via ORAL
  Filled 2015-06-19 (×2): qty 1

## 2015-06-19 MED ORDER — LACTATED RINGERS IV SOLN
INTRAVENOUS | Status: DC
  Administered 2015-06-19 – 2015-06-20 (×2): via INTRAVENOUS

## 2015-06-19 MED ORDER — FENTANYL CITRATE (PF) 100 MCG/2ML IJ SOLN
INTRAMUSCULAR | Status: DC | PRN
  Administered 2015-06-19: 25 ug via INTRAVENOUS
  Administered 2015-06-19 (×2): 100 ug via INTRAVENOUS
  Administered 2015-06-19: 25 ug via INTRAVENOUS

## 2015-06-19 MED ORDER — PROPOFOL 10 MG/ML IV BOLUS
INTRAVENOUS | Status: DC | PRN
  Administered 2015-06-19: 200 mg via INTRAVENOUS

## 2015-06-19 MED ORDER — NEOSTIGMINE METHYLSULFATE 10 MG/10ML IV SOLN
INTRAVENOUS | Status: DC | PRN
  Administered 2015-06-19: 5 mg via INTRAVENOUS

## 2015-06-19 MED ORDER — FENTANYL CITRATE (PF) 100 MCG/2ML IJ SOLN
INTRAMUSCULAR | Status: AC
  Administered 2015-06-19: 50 ug via INTRAVENOUS
  Filled 2015-06-19: qty 2

## 2015-06-19 MED ORDER — GLYCOPYRROLATE 0.2 MG/ML IJ SOLN
INTRAMUSCULAR | Status: AC
  Filled 2015-06-19: qty 1

## 2015-06-19 MED ORDER — HYDROMORPHONE HCL 1 MG/ML IJ SOLN
INTRAMUSCULAR | Status: AC
  Filled 2015-06-19: qty 1

## 2015-06-19 MED ORDER — SCOPOLAMINE 1 MG/3DAYS TD PT72
MEDICATED_PATCH | TRANSDERMAL | Status: AC
  Administered 2015-06-19: 1.5 mg via TRANSDERMAL
  Filled 2015-06-19: qty 1

## 2015-06-19 MED ORDER — SUCCINYLCHOLINE CHLORIDE 20 MG/ML IJ SOLN
INTRAMUSCULAR | Status: DC | PRN
  Administered 2015-06-19: 120 mg via INTRAVENOUS

## 2015-06-19 MED ORDER — HYDROMORPHONE 1 MG/ML IV SOLN
INTRAVENOUS | Status: DC
  Administered 2015-06-19: 6.9 mg via INTRAVENOUS
  Administered 2015-06-19: 16:00:00 via INTRAVENOUS
  Administered 2015-06-19: 3.9 mg via INTRAVENOUS
  Administered 2015-06-20: 2.7 mg via INTRAVENOUS
  Administered 2015-06-20: 2.1 mg via INTRAVENOUS
  Filled 2015-06-19 (×2): qty 25

## 2015-06-19 MED ORDER — MENTHOL 3 MG MT LOZG: 1.0000 | LOZENGE | OROMUCOSAL | Status: DC | PRN

## 2015-06-19 MED ORDER — BUPIVACAINE HCL (PF) 0.25 % IJ SOLN
INTRAMUSCULAR | Status: DC | PRN
  Administered 2015-06-19: 5 mL
  Administered 2015-06-19: 10 mL
  Administered 2015-06-19: 3 mL

## 2015-06-19 MED ORDER — ZOLPIDEM TARTRATE 5 MG PO TABS
5.0000 mg | ORAL_TABLET | Freq: Every evening | ORAL | Status: DC | PRN
  Administered 2015-06-19: 5 mg via ORAL
  Filled 2015-06-19: qty 1

## 2015-06-19 MED ORDER — FLUORESCEIN SODIUM 10 % IJ SOLN
INTRAMUSCULAR | Status: AC
  Filled 2015-06-19: qty 5

## 2015-06-19 MED ORDER — OXYCODONE-ACETAMINOPHEN 5-325 MG PO TABS
1.0000 | ORAL_TABLET | ORAL | Status: DC | PRN
  Administered 2015-06-20: 2 via ORAL
  Filled 2015-06-19: qty 2

## 2015-06-19 MED ORDER — ROCURONIUM BROMIDE 100 MG/10ML IV SOLN
INTRAVENOUS | Status: AC
  Filled 2015-06-19: qty 1

## 2015-06-19 MED ORDER — GLYCOPYRROLATE 0.2 MG/ML IJ SOLN
INTRAMUSCULAR | Status: AC
  Filled 2015-06-19: qty 5

## 2015-06-19 MED ORDER — ROCURONIUM BROMIDE 100 MG/10ML IV SOLN
INTRAVENOUS | Status: DC | PRN
  Administered 2015-06-19: 5 mg via INTRAVENOUS
  Administered 2015-06-19 (×2): 10 mg via INTRAVENOUS
  Administered 2015-06-19: 5 mg via INTRAVENOUS
  Administered 2015-06-19: 30 mg via INTRAVENOUS
  Administered 2015-06-19: 10 mg via INTRAVENOUS
  Administered 2015-06-19: 5 mg via INTRAVENOUS

## 2015-06-19 MED ORDER — MEPERIDINE HCL 25 MG/ML IJ SOLN: 6.2500 mg | INTRAMUSCULAR | Status: DC | PRN

## 2015-06-19 MED ORDER — CYCLOBENZAPRINE HCL 5 MG PO TABS
5.0000 mg | ORAL_TABLET | Freq: Three times a day (TID) | ORAL | Status: DC | PRN
  Administered 2015-06-19 – 2015-06-20 (×2): 5 mg via ORAL
  Filled 2015-06-19 (×4): qty 1

## 2015-06-19 MED ORDER — GLYCOPYRROLATE 0.2 MG/ML IJ SOLN
INTRAMUSCULAR | Status: DC | PRN
  Administered 2015-06-19: 1 mg via INTRAVENOUS

## 2015-06-19 MED ORDER — VASOPRESSIN 20 UNIT/ML IV SOLN
INTRAVENOUS | Status: AC
  Filled 2015-06-19: qty 1

## 2015-06-19 MED ORDER — LACTATED RINGERS IV SOLN
INTRAVENOUS | Status: DC
  Administered 2015-06-19 (×3): via INTRAVENOUS

## 2015-06-19 MED ORDER — PANTOPRAZOLE SODIUM 40 MG PO TBEC
40.0000 mg | DELAYED_RELEASE_TABLET | Freq: Two times a day (BID) | ORAL | Status: DC
  Administered 2015-06-19 – 2015-06-20 (×2): 40 mg via ORAL
  Filled 2015-06-19: qty 1

## 2015-06-19 MED ORDER — DIPHENHYDRAMINE HCL 12.5 MG/5ML PO ELIX
12.5000 mg | ORAL_SOLUTION | Freq: Four times a day (QID) | ORAL | Status: DC | PRN
  Administered 2015-06-19 – 2015-06-20 (×2): 12.5 mg via ORAL
  Filled 2015-06-19 (×2): qty 5

## 2015-06-19 MED ORDER — FENTANYL CITRATE (PF) 100 MCG/2ML IJ SOLN
INTRAMUSCULAR | Status: AC
  Filled 2015-06-19: qty 2

## 2015-06-19 MED ORDER — STERILE WATER FOR IRRIGATION IR SOLN
Status: DC | PRN
  Administered 2015-06-19: 1000 mL via INTRAVESICAL

## 2015-06-19 MED ORDER — MIDAZOLAM HCL 2 MG/2ML IJ SOLN
INTRAMUSCULAR | Status: AC
  Filled 2015-06-19: qty 2

## 2015-06-19 SURGICAL SUPPLY — 57 items
APL SRG 38 LTWT LNG FL B (MISCELLANEOUS) ×2
APPLICATOR ARISTA FLEXITIP XL (MISCELLANEOUS) ×1 IMPLANT
CABLE HIGH FREQUENCY MONO STRZ (ELECTRODE) IMPLANT
CLOTH BEACON ORANGE TIMEOUT ST (SAFETY) ×3 IMPLANT
CONT PATH 16OZ SNAP LID 3702 (MISCELLANEOUS) ×3 IMPLANT
COVER BACK TABLE 60X90IN (DRAPES) ×3 IMPLANT
COVER MAYO STAND STRL (DRAPES) ×3 IMPLANT
DECANTER SPIKE VIAL GLASS SM (MISCELLANEOUS) ×3 IMPLANT
DRAPE SHEET LG 3/4 BI-LAMINATE (DRAPES) ×6 IMPLANT
DRSG COVADERM PLUS 2X2 (GAUZE/BANDAGES/DRESSINGS) ×6 IMPLANT
DRSG OPSITE POSTOP 3X4 (GAUZE/BANDAGES/DRESSINGS) IMPLANT
DURAPREP 26ML APPLICATOR (WOUND CARE) ×3 IMPLANT
ELECT REM PT RETURN 9FT ADLT (ELECTROSURGICAL) ×3
ELECTRODE REM PT RTRN 9FT ADLT (ELECTROSURGICAL) IMPLANT
EVACUATOR SMOKE 8.L (FILTER) ×5 IMPLANT
FORCEPS CUTTING 33CM 5MM (CUTTING FORCEPS) IMPLANT
GAUZE PACKING 2X5 YD STRL (GAUZE/BANDAGES/DRESSINGS) IMPLANT
GAUZE VASELINE 3X9 (GAUZE/BANDAGES/DRESSINGS) IMPLANT
GLOVE BIO SURGEON STRL SZ 6.5 (GLOVE) ×6 IMPLANT
GLOVE BIOGEL PI IND STRL 6.5 (GLOVE) ×2 IMPLANT
GLOVE BIOGEL PI IND STRL 7.0 (GLOVE) ×8 IMPLANT
GLOVE BIOGEL PI INDICATOR 6.5 (GLOVE) ×3
GLOVE BIOGEL PI INDICATOR 7.0 (GLOVE) ×6
HEMOSTAT ARISTA ABSORB 3G PWDR (MISCELLANEOUS) ×1 IMPLANT
LEGGING LITHOTOMY PAIR STRL (DRAPES) ×3 IMPLANT
LIQUID BAND (GAUZE/BANDAGES/DRESSINGS) IMPLANT
NDL MAYO CATGUT SZ4 TPR NDL (NEEDLE) ×2 IMPLANT
NEEDLE MAYO CATGUT SZ4 (NEEDLE) ×3 IMPLANT
NS IRRIG 1000ML POUR BTL (IV SOLUTION) ×3 IMPLANT
PACK LAVH (CUSTOM PROCEDURE TRAY) ×3 IMPLANT
PACK ROBOTIC GOWN (GOWN DISPOSABLE) ×3 IMPLANT
PAD TRENDELENBURG POSITION (MISCELLANEOUS) ×3 IMPLANT
POUCH LAPAROSCOPIC INSTRUMENT (MISCELLANEOUS) ×1 IMPLANT
SET CYSTO W/LG BORE CLAMP LF (SET/KITS/TRAYS/PACK) ×3 IMPLANT
SET IRRIG TUBING LAPAROSCOPIC (IRRIGATION / IRRIGATOR) IMPLANT
SHEARS HARMONIC ACE PLUS 36CM (ENDOMECHANICALS) ×1 IMPLANT
SLEEVE XCEL OPT CAN 5 100 (ENDOMECHANICALS) ×3 IMPLANT
SOLUTION ELECTROLUBE (MISCELLANEOUS) ×1 IMPLANT
STRIP CLOSURE SKIN 1/4X3 (GAUZE/BANDAGES/DRESSINGS) IMPLANT
SUT CHROMIC 0 CT 1 (SUTURE) ×3 IMPLANT
SUT MNCRL AB 3-0 PS2 27 (SUTURE) ×6 IMPLANT
SUT VIC AB 0 CT1 18XCR BRD8 (SUTURE) ×6 IMPLANT
SUT VIC AB 0 CT1 27 (SUTURE) ×3
SUT VIC AB 0 CT1 27XBRD ANBCTR (SUTURE) ×2 IMPLANT
SUT VIC AB 0 CT1 36 (SUTURE) ×3 IMPLANT
SUT VIC AB 0 CT1 8-18 (SUTURE) ×6
SUT VICRYL 0 TIES 12 18 (SUTURE) ×3 IMPLANT
SUT VICRYL 0 UR6 27IN ABS (SUTURE) ×6 IMPLANT
SYR 50ML LL SCALE MARK (SYRINGE) IMPLANT
SYR BULB IRRIGATION 50ML (SYRINGE) ×3 IMPLANT
SYR TB 1ML LUER SLIP (SYRINGE) ×3 IMPLANT
TOWEL OR 17X24 6PK STRL BLUE (TOWEL DISPOSABLE) ×7 IMPLANT
TRAY FOLEY CATH SILVER 14FR (SET/KITS/TRAYS/PACK) ×3 IMPLANT
TROCAR BALLN 12MMX100 BLUNT (TROCAR) ×3 IMPLANT
TROCAR XCEL NON-BLD 5MMX100MML (ENDOMECHANICALS) ×1 IMPLANT
TUBING FILTER THERMOFLATOR (ELECTROSURGICAL) ×2 IMPLANT
WATER STERILE IRR 1000ML POUR (IV SOLUTION) ×2 IMPLANT

## 2015-06-19 NOTE — Op Note (Signed)
reop Diagnosis: Menorrhagia, Dysmenorrhea  Postop Diagnosis: same  Procedure: LAVH, B salpingectmy, Cystoscopy  Anesthesia: General   Anesthesiologist: Dr Malen GauzeFoster  Attending: Michael LitterNaima A Crecencio Kwiatek, MD   Assistant: Henreitta LeberElmira Powell PA  Findings: normal size uterus with bladder adhesions.  Normal appearing appendix. Normal appearing tubes and ovaries  Pathology: uterus and cervix and bilateral tubes  Fluids: 2000 cccrystalloid  UOP: 200cc  EBL: 150cc  Complications:none  Procedure: The patient was taken to the operating room, placed under general anesthesia and prepped and draped in the normal sterile fashion. A Foley catheter was placed in the bladde. A weighted speculum and vaginal retractors were placed in the vagina. Tenaculum was placed on the anterior lip of the cervix.  A hulka manipulator was placed in the uterus. Attention was then turned to the abdomen. A 10 mm infraumbilical incision was made with the scalpel after 5 cc of 25% percent Marcaine was used for local anesthesia. The subcutaneous tissue was dissected and the fascia was incised with the knife. A purse string stitch was placed in the fascia and Hassan placed into the intra-abdominal cavity and anchored to the suture. Intraabdominal placement was confirmed with the laparoscope.  Two 5 mm trochars were placed in the right and left lower quadrants under direct visualization with the laparoscope.    .   Both round ligaments were cauterized and cut with the gyrus bipolar cautery as well and the bladder flap created with the tripolar and removed away from the uterus. The left fallopian tube was cauterized and removed.    The left uterine ovarian ligament was then cauterized and cut.  The right fallopian tube was cauterized cut and removed.  The right utero-ovarian ligament was cauterized and cut with the tripolar cautery gyrus.   Attention was then turned to the vagina.  A weighted speculum was placed in the posterior fourchette.   petrussin mixture was placed circumferentially around the cervix.  .  Both uterosacral ligaments were clamped, cut and suture ligated and held.  The anterior and posterior culdesac was entered sharply using metzenbaum scissors.  The cardinal ligaments and  The uterine arteries were clamped, cut and suture ligated bilaterally.    The uterus was too big to deliver.  It was morcellated sharply using the knife for thirty minutes.  The uterus was then reduced in size and delivered.  The mcall suture was placed.   The vaginal cuff was closed with interrupted suture of 0 vicryl.   the patient was given fluroscein.  Cystoscopy was performed and both ureters were seen to efflux fluroscein without difficulty. The bladder had full integrity with no suture or laceration visualized.  The vagina was inspected and the cuff was noted to be intact.  Attention was then turned back to the abdomen after removing top pair of gloves. The abdomen was reinsufflated with CO2 gas.   The abdomen and pelvis was copiously irrigated.  There was some bleeding from both adnexa which was made hemostatic with aristat.     hemostasis was noted.  interceed was placed along the cuff.    All trochars were removed under direct visualization using the laparoscope.  The umbilical fascia was reapproximated by tying the circumferential suture. The two 5 mm incisions were closed with 3-0 Monocryl via a subcuticular stitch.  All remaining skin incisions were closed with Dermabond and the 10 mm skin incisions were reinforced using Dermabond.  Sponge lap and needle counts were correct.  The patient tolerated the procedure well and was  returned to the PACU in stable condition

## 2015-06-19 NOTE — H&P (View-Only) (Signed)
[name] is a 34 y.o. female P: 2-0-0-2 who presents for hysterectomy because of menorrhagia and dysmenorrhea. Since menarche the patient states that her menstrual periods have been heavy with significant cramping.  Over the past seven years, however, she believes that her symptoms have gotten progressively worse.  She has never used any hormonal management nor Lysteda for her bleeding   though she is aware that both may have a positive  impact.  Instead she endures a 7-9 day flow in  which  she will use 120 super plus tampons because of heavy clotting.  At  times she has to change her protection every 45 minutes.   She admits cramping that she rates as 9/10 on a 10 point pain scale and  over the counter analgesia provides no relief.  The only relief she will get is by  applying lateral pressure on her buttock area as most of her cramping is felt in her back.   She goes on to report that 1 week before her period she will have rectal spasms with bowel movements and deep dyspareunia that is not present at other times.  She denies any changes  in urinary function.  An endometrial biopsy in January 2017 showed benign proliferative endometrium with no hyperplasia or malignancy.  A pelvic ultrasound in November 2016 showed:  anteverted uterus-6.79 x 5.85 x 5.01 cm,  left ovary-2.5 x 2.0 x 2.0 cm and right ovary-2.5 x 1.6 x 1.6 cm.  A reveiw of both medical and surgical management options were given to the patient.  After considering that she has completed childbearing along with the disruptive and debilitating nature of her menstrual symptoms,  the patient has decided to proceed with definitive therapy in the form of hysterectomy   Past Medical History  OB History: G: 2;  P: 2-0-0-2;  C-section 2007 and 2009  GYN History: menarche: 35YO    LMP: 06/04/2015    Contracepton: Vasectomy; Denies history of STD or abnormal PAP smear.  Last PAP smear-2016, normal  Medical History: Anxiety Disorder,  Depression.   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 Asa [Aspirin]     pain  . Macrobid [Nitrofurantoin Monohyd Macro] Other (See Comments)    Stomach pain  . Naproxen Nausea And Vomiting and Other (See Comments)    cramping  . Penicillins Hives    Has patient had a PCN reaction causing immediate rash, facial/tongue/throat swelling, SOB or lightheadedness with hypotension: No Has patient had a PCN reaction causing severe rash involving mucus membranes or skin necrosis: No Has patient had a PCN reaction that required hospitalization No Has patient had a PCN reaction occurring within the last 10 years: No If all of the above answers are "NO", then may proceed with Cephalosporin use.  . Sudafed [Pseudoephedrine Hcl] Hives    ROS: Admits to glasses, upper middle jaw partial dental plate, weekly headaches (due to migraines and pseudo-tumor cerebri), occasional bilateral  pedal   edema, nausea with menses,  but  denies  vision changes, nasal congestion, dysphagia, tinnitus, dizziness, hoarseness, cough,  chest pain, shortness of breath, vomiting, diarrhea,constipation,  urinary frequency, urgency  dysuria, hematuria, vaginitis symptoms,  swelling of joints,easy bruising,  myalgias, arthralgias, unexplained weight loss and except as is mentioned in the history of present illness, patient's review of systems is otherwise negative.   Physical  Exam  Bp: 110/70  P: 80    R: 16  Weight: 253 lbs.  Height: 5'5"  BMI: 42.1  Neck: supple without masses or thyromegaly Lungs: clear to auscultation Heart: regular rate and rhythm Abdomen: soft, non-tender and no organomegaly Pelvic:EGBUS- wnl; vagina-normal rugae; uterus-normal size, cervix without lesions or motion tenderness; adnexae-no tenderness or masses Extremities:  no clubbing, cyanosis or edema   Assesment:  Menorrhagia            Severe Dysmenorrhea   Disposition: Reviewed with the patient the indications  for her procedures along with the the risks of surgery to include, but not limited to: reaction to anesthesia, damage to adjacent organs, infection, possible need for an open abdominal incision and excessive bleeding. The patient verbalized understanding of these risks and has consented to proceed with an Laparoscopically Assisted Vaginal Hysterectomy, Bilateral Salpingectomy and Cystoscopy with Possible Abdominal Hysterectomy  at [facility] on June 19, 2015 at 9 a.m.    CSN# [id number]   Margaret Cerino J. Madilynn Montante, PA-C  for Dr. Naima A. Dillard   

## 2015-06-19 NOTE — Interval H&P Note (Signed)
History and Physical Interval Note:  06/19/2015 7:56 AM  [name]  has presented today for surgery, with the diagnosis of Dysmenorrhea, Menorrhagia  The various methods of treatment have been discussed with the patient and family. After consideration of risks, benefits and other options for treatment, the patient has consented to  Procedure(s): LAPAROSCOPIC ASSISTED VAGINAL HYSTERECTOMY WITH SALPINGECTOMY (Bilateral) as a surgical intervention .  The patient's history has been reviewed, patient examined, no change in status, stable for surgery.  I have reviewed the patient's chart and labs.  Questions were answered to the patient's satisfaction.     Landmann-Jungman Memorial HospitalDILLARD,Aundrea Higginbotham A

## 2015-06-19 NOTE — Anesthesia Postprocedure Evaluation (Signed)
Anesthesia Post Note  Patient: [name]  Procedure(s) Performed: Procedure(s) (LRB): LAPAROSCOPIC ASSISTED VAGINAL HYSTERECTOMY WITH SALPINGECTOMY (Bilateral) CYSTOSCOPY (N/A)  Patient location during evaluation: PACU Anesthesia Type: General Level of consciousness: awake and alert Pain management: pain level controlled Vital Signs Assessment: post-procedure vital signs reviewed and stable Respiratory status: spontaneous breathing, nonlabored ventilation, respiratory function stable and patient connected to nasal cannula oxygen Cardiovascular status: blood pressure returned to baseline and stable Postop Assessment: no signs of nausea or vomiting Anesthetic complications: no    Last Vitals:  Filed Vitals:   06/19/15 0756  BP: 131/90  Pulse: 90  Temp: 36.7 C  Resp: 16    Last Pain: There were no vitals filed for this visit.               Sebastian Acheheodore Katerra Ingman

## 2015-06-19 NOTE — Anesthesia Procedure Notes (Signed)
Procedure Name: Intubation Date/Time: 06/19/2015 9:01 AM Performed by: [name], [name]: Patient identified, Emergency Drugs available, Suction available, Patient being monitored and Timeout performed Patient Re-evaluated:Patient Re-evaluated prior to inductionOxygen Delivery Method: Circle system utilized Preoxygenation: Pre-oxygenation with 100% oxygen Intubation Type: IV induction Ventilation: Mask ventilation without difficulty Laryngoscope Size: Miller and 2 Grade View: Grade II Tube type: Oral Tube size: 7.0 mm Number of attempts: 1 Airway Equipment and Method: Stylet Placement Confirmation: ETT inserted through vocal cords under direct vision,  positive ETCO2 and breath sounds checked- equal and bilateral Secured at: 20 cm Tube secured with: Tape Dental Injury: Teeth and Oropharynx as per pre-operative assessment

## 2015-06-19 NOTE — Transfer of Care (Signed)
Immediate Anesthesia Transfer of Care Note  Patient: [name]  Procedure(s) Performed: Procedure(s): LAPAROSCOPIC ASSISTED VAGINAL HYSTERECTOMY WITH SALPINGECTOMY (Bilateral) CYSTOSCOPY (N/A)  Patient Location: PACU  Anesthesia Type:General  Level of Consciousness: awake, alert  and oriented  Airway & Oxygen Therapy: Patient Spontanous Breathing and Patient connected to nasal cannula oxygen  Post-op Assessment: Report given to RN and Post -op Vital signs reviewed and stable  Post vital signs: Reviewed and stable  Last Vitals:  Filed Vitals:   06/19/15 0756  BP: 131/90  Pulse: 90  Temp: 36.7 C  Resp: 16    Complications: No apparent anesthesia complications

## 2015-06-19 NOTE — Progress Notes (Signed)
Day of Surgery Procedure(s) (LRB): LAPAROSCOPIC ASSISTED VAGINAL HYSTERECTOMY WITH SALPINGECTOMY (Bilateral) CYSTOSCOPY (N/A)  Subjective: Patient reports incisional pain and tolerating PO.    Objective: I have reviewed patient's vital signs, intake and output and medications.  General: alert and cooperative Resp: clear to auscultation bilaterally Cardio: regular rate and rhythm GI: soft, non-tender; bowel sounds normal; no masses,  no organomegaly Extremities: Homans sign is negative, no sign of DVT Vaginal Bleeding: minimal  Assessment: s/p Procedure(s): LAPAROSCOPIC ASSISTED VAGINAL HYSTERECTOMY WITH SALPINGECTOMY (Bilateral) CYSTOSCOPY (N/A): stable, progressing well and Pt with chronic back pain that has been aggrevated and she has only little relief with the toradol and PCA  Plan: Advance diet Encourage ambulation Flexeril prn  LOS: 0 days    Briant Angelillo A 06/19/2015, 6:42 PM

## 2015-06-20 ENCOUNTER — Encounter (HOSPITAL_COMMUNITY): Payer: Self-pay | Admitting: Obstetrics and Gynecology

## 2015-06-20 DIAGNOSIS — N920 Excessive and frequent menstruation with regular cycle: Secondary | ICD-10-CM | POA: Diagnosis not present

## 2015-06-20 LAB — CBC
HCT: 28.7 % — ABNORMAL LOW (ref 36.0–46.0)
Hemoglobin: 9.2 g/dL — ABNORMAL LOW (ref 12.0–15.0)
MCH: 28.5 pg (ref 26.0–34.0)
MCHC: 32.1 g/dL (ref 30.0–36.0)
MCV: 88.9 fL (ref 78.0–100.0)
PLATELETS: 354 10*3/uL (ref 150–400)
RBC: 3.23 MIL/uL — AB (ref 3.87–5.11)
RDW: 15.8 % — ABNORMAL HIGH (ref 11.5–15.5)
WBC: 13.5 10*3/uL — AB (ref 4.0–10.5)

## 2015-06-20 MED ORDER — IBUPROFEN 600 MG PO TABS: ORAL_TABLET | ORAL | Status: DC

## 2015-06-20 MED ORDER — OXYCODONE-ACETAMINOPHEN 5-325 MG PO TABS: 1.0000 | ORAL_TABLET | ORAL | Status: DC | PRN

## 2015-06-20 MED ORDER — CYCLOBENZAPRINE HCL 5 MG PO TABS: 5.0000 mg | ORAL_TABLET | Freq: Three times a day (TID) | ORAL | Status: DC | PRN

## 2015-06-20 NOTE — Progress Notes (Signed)
Patient stated she has voided twice, tolerating regular diet and ambulating in hallway.

## 2015-06-20 NOTE — Progress Notes (Signed)
[name] is a34 y.o.  [id number]  Post Op Date # 1: LAVH/BS  Subjective: Patient is Postoperative course complicated by poor pain control and itching.  Reports that pain management has improved some with the addition of Flexeril but that she is itching a lot. Patient has been on PCA Dilaudid, IV Toradol and Benadryl. Ambulating in the hall without difficulty,  tolerating a regular diet but hasn't voided since Foley was removed.   Objective: Vital signs in last 24 hours: Temp:  [98 F (36.7 C)-98.8 F (37.1 C)] 98.1 F (36.7 C) (04/19 0506) Pulse Rate:  [73-105] 78 (04/19 0506) Resp:  [10-20] 16 (04/19 0600) BP: (101-138)/(48-90) 129/70 mmHg (04/19 0506) SpO2:  [94 %-99 %] 98 % (04/19 0600) FiO2 (%):  [84 %-93 %] 93 % (04/19 0600)  Intake/Output from previous day: 04/18 0701 - 04/19 0700 In: 2200 [I.V.:2200] Out: 2400 [Urine:2250] Intake/Output this shift: Total I/O In: -  Out: 1100 [Urine:1100]  Recent Labs Lab 06/20/15 0514  WBC 13.5*  HGB 9.2*  HCT 28.7*  PLT 354    No results for input(s): NA, K, CL, CO2, BUN, CREATININE, CALCIUM, PROT, BILITOT, ALKPHOS, ALT, AST, GLUCOSE in the last 168 hours.  Invalid input(s): LABALBU  EXAM: General: alert, cooperative and no distress Resp: clear to auscultation bilaterally Cardio: regular rate and rhythm, S1, S2 normal, no murmur, click, rub or gallop GI: Boiwel sounds present; umbilical dressing clean/dry/intact; lower abdominal incisions without evidence of infection. Extremities: no calf tenderness.   Assessment: s/p Procedure(s): LAPAROSCOPIC ASSISTED VAGINAL HYSTERECTOMY WITH SALPINGECTOMY CYSTOSCOPY: stable, progressing well, anemia and poor pain control..  Plan: To change to oral analgesia after breakfast.  Routine care and will consider discharge later today  LOS: 1 day    Margaret Hargett, PA-C 06/20/2015 6:54 AM

## 2015-06-20 NOTE — Progress Notes (Signed)
Discharge instructions reviewed with patient.  Patient states understanding of home care, medications, activity, signs/symptoms to report to MD and return MD office visit.  Patients significant other and family will assist with her care @ home.  No home  equipment needed, patient has prescriptions and all personal belongings.  Patient discharged via wheelchair in stable condition with staff without incident.  

## 2015-06-20 NOTE — Discharge Instructions (Signed)
Call [facility], [phone] for:  Post operative appointment time and date (6 weeks) Temperature greater than or equal to 100.4 degrees Farenheit orally Excessive pain not managed with your pain medications Excessive bleeding, problems urinating or other concerns   While taking pain medications, take Colace (Docusate Sodium) 100 mg 2-3 times daily until bowel movements are regular to prevent constipation. Take Ibuprofen 600 mg with food every 6 hours for 5 days then as needed for pain  Take either Cyclobenzaprine 5  or  10 mg  3 times a day for back pain as needed Take over the counter iron supplement of your choice twice a day for 12 weeks  You may shower  You may walk up stairs  You may drive after  2 weeks You may lift items greater than 15  pounds after 6 weeks You should not resume sexual activity or place anything in your vagina until after 6  weeks  You may resume a regular diet    Total Laparoscopic Hysterectomy A total laLaparoscopically Assisted Vaginal Hysterectomy, Care After Refer to this sheet in the next few weeks. These instructions provide you with information on caring for yourself after your procedure. Your health care provider may also give you more specific instructions. Your treatment has been planned according to current medical practices, but problems sometimes occur. Call your health care provider if you have any problems or questions after your procedure. WHAT TO EXPECT AFTER THE PROCEDURE After your procedure, it is typical to have the following:  Abdominal pain. You will be given pain medicine to control it.  Sore throat from the breathing tube that was inserted during surgery. HOME CARE INSTRUCTIONS  Only take over-the-counter or prescription medicines for pain, discomfort, or fever as directed by your health care provider.  Do not take aspirin. It can cause bleeding.  Do not drive when taking pain medicine.  Follow your health  care provider's advice regarding diet, exercise, lifting, driving, and general activities.  Resume your usual diet as directed and allowed.  Get plenty of rest and sleep.  Do not douche, use tampons, or have sexual intercourse for at least 6 weeks, or until your health care provider gives you permission.  Change your bandages (dressings) as directed by your health care provider.  Monitor your temperature and notify your health care provider of a fever.  Take showers instead of baths for 2-3 weeks.  Do not drink alcohol until your health care provider gives you permission.  If you develop constipation, you may take a mild laxative with your health care provider's permission. Bran foods may help with constipation problems. Drinking enough fluids to keep your urine clear or pale yellow may help as well.  Try to have someone home with you for 1-2 weeks to help around the house.  Keep all of your follow-up appointments as directed by your health care provider. SEEK MEDICAL CARE IF:   You have swelling, redness, or increasing pain around your incision sites.  You have pus coming from your incision.  You notice a bad smell coming from your incision.  Your incision breaks open.  You feel dizzy or lightheaded.  You have pain or bleeding when you urinate.  You have persistent diarrhea.  You have persistent nausea and vomiting.  You have abnormal vaginal discharge.  You have a rash.  You have any type of abnormal reaction or develop an allergy to your medicine.  You have poor pain control with your prescribed medicine. SEEK  IMMEDIATE MEDICAL CARE IF:   You have a fever.  You have severe abdominal pain.  You have chest pain.  You have shortness of breath.  You faint.  You have pain, swelling, or redness in your leg.  You have heavy vaginal bleeding with blood clots. MAKE SURE YOU:  Understand these instructions.  Will watch your condition.  Will get help right  away if you are not doing well or get worse.   This information is not intended to replace advice given to you by your health care provider. Make sure you discuss any questions you have with your health care provider.   Document Released: 02/06/2011 Document Revised: 02/22/2013 Document Reviewed: 09/02/2012 Elsevier Interactive Patient Education 2016 [employer]  Education [employer]2016 Elsevier Inc.

## 2015-06-20 NOTE — Discharge Summary (Signed)
Physician Discharge Summary  Patient ID: [name] MRN: [id number] DOB/AGE: [birth year] 35 y.o.  Admit date: 06/19/2015 Discharge date: 06/20/2015   Discharge Diagnoses:  Menorrhagia and Severe Dysmenorrhea Active Problems:   Pelvic pain in female   Operation: Laparoscopically Assisted Vaginal Hysterectomy, Bilateral Salpingectomy and Cystoscopy   Discharged Condition: Good  Hospital Course: On the date of admission the patient underwent the aforementioned procedures and tolerated them well. Post operative course was marked by (pre-hospital) lower back and hip pain that required a muscle relaxer in addition to her PCA Dilaudid and IV Toradol.  By post operative day #1  the patient had resumed bowel and bladder function, achieved better pain control and was tolerating a H/H of 9.2/28.7.  Having received the maximum benefit of her hospital stay the patient was discharged home on post operative day #1.  Disposition: 01-Home or Self Care  Discharge Medications:    Medication List    STOP taking these medications        cephALEXin 500 MG capsule  Commonly known as:  KEFLEX     HYDROcodone-acetaminophen 5-325 MG tablet  Commonly known as:  NORCO/VICODIN     naproxen 500 MG tablet  Commonly known as:  NAPROSYN     ondansetron 4 MG tablet  Commonly known as:  ZOFRAN     promethazine 25 MG suppository  Commonly known as:  PHENERGAN     promethazine 25 MG tablet  Commonly known as:  PHENERGAN     sulfamethoxazole-trimethoprim 800-160 MG tablet  Commonly known as:  SEPTRA DS      TAKE these medications        ALPRAZolam 0.5 MG tablet  Commonly known as:  XANAX  Take 0.25-0.5 tablets by mouth 2 (two) times daily as needed.     amphetamine-dextroamphetamine 15 MG 24 hr capsule  Commonly known as:  ADDERALL XR  Take 15 mg by mouth every morning.     citalopram 20 MG tablet  Commonly known as:  CELEXA  Take 20 mg by mouth daily.     cyclobenzaprine 10 MG tablet   Commonly known as:  FLEXERIL  Take 1 tablet (10 mg total) by mouth 2 (two) times daily as needed for muscle spasms.     cyclobenzaprine 5 MG tablet  Commonly known as:  FLEXERIL  Take 1 tablet (5 mg total) by mouth 3 (three) times daily as needed for muscle spasms (severe back pain).     ibuprofen 600 MG tablet  Commonly known as:  ADVIL,MOTRIN  1 po  pc every 6 hours for 5 days then as needed for pain     oxyCODONE-acetaminophen 5-325 MG tablet  Commonly known as:  PERCOCET/ROXICET  Take 1-2 tablets by mouth every 4 (four) hours as needed for severe pain (moderate to severe pain (when tolerating fluids)).     pantoprazole 40 MG tablet  Commonly known as:  PROTONIX  Take 1 tablet by mouth 2 (two) times daily.          Follow-up: Dr. Samule OhmNaima A. Howie Rufus in 6 weeks     Signed: Patrick JupiterOWELL,ELMIRA , PA-C  06/20/2015, 7:15 AM

## 2015-06-23 DIAGNOSIS — R600 Localized edema: Secondary | ICD-10-CM | POA: Insufficient documentation

## 2015-06-23 DIAGNOSIS — R0600 Dyspnea, unspecified: Secondary | ICD-10-CM | POA: Insufficient documentation

## 2015-06-24 ENCOUNTER — Encounter (HOSPITAL_COMMUNITY): Payer: Self-pay | Admitting: *Deleted

## 2015-06-24 ENCOUNTER — Inpatient Hospital Stay (HOSPITAL_COMMUNITY)
Admission: AD | Admit: 2015-06-24 | Discharge: 2015-06-24 | Disposition: A | Discharge status: Home / Self Care | Payer: BLUE CROSS/BLUE SHIELD | Source: Ambulatory Visit | Attending: Obstetrics and Gynecology | Admitting: Obstetrics and Gynecology

## 2015-06-24 DIAGNOSIS — Z90710 Acquired absence of both cervix and uterus: Secondary | ICD-10-CM | POA: Diagnosis not present

## 2015-06-24 DIAGNOSIS — F419 Anxiety disorder, unspecified: Secondary | ICD-10-CM | POA: Diagnosis not present

## 2015-06-24 DIAGNOSIS — R509 Fever, unspecified: Secondary | ICD-10-CM | POA: Diagnosis not present

## 2015-06-24 DIAGNOSIS — Z79899 Other long term (current) drug therapy: Secondary | ICD-10-CM | POA: Diagnosis not present

## 2015-06-24 DIAGNOSIS — B370 Candidal stomatitis: Secondary | ICD-10-CM | POA: Insufficient documentation

## 2015-06-24 DIAGNOSIS — F329 Major depressive disorder, single episode, unspecified: Secondary | ICD-10-CM | POA: Diagnosis not present

## 2015-06-24 DIAGNOSIS — Z880 Allergy status to penicillin: Secondary | ICD-10-CM | POA: Diagnosis not present

## 2015-06-24 DIAGNOSIS — R609 Edema, unspecified: Secondary | ICD-10-CM | POA: Diagnosis not present

## 2015-06-24 DIAGNOSIS — R109 Unspecified abdominal pain: Secondary | ICD-10-CM

## 2015-06-24 DIAGNOSIS — F909 Attention-deficit hyperactivity disorder, unspecified type: Secondary | ICD-10-CM | POA: Insufficient documentation

## 2015-06-24 DIAGNOSIS — Z886 Allergy status to analgesic agent status: Secondary | ICD-10-CM | POA: Insufficient documentation

## 2015-06-24 DIAGNOSIS — R5082 Postprocedural fever: Secondary | ICD-10-CM

## 2015-06-24 HISTORY — DX: Attention-deficit hyperactivity disorder, other type: F90.8

## 2015-06-24 LAB — CBC WITH DIFFERENTIAL/PLATELET
BASOS ABS: 0 10*3/uL (ref 0.0–0.1)
BASOS PCT: 0 %
EOS ABS: 0 10*3/uL (ref 0.0–0.7)
Eosinophils Relative: 0 %
HEMATOCRIT: 27.6 % — AB (ref 36.0–46.0)
HEMOGLOBIN: 8.8 g/dL — AB (ref 12.0–15.0)
Lymphocytes Relative: 10 %
Lymphs Abs: 1 10*3/uL (ref 0.7–4.0)
MCH: 27.2 pg (ref 26.0–34.0)
MCHC: 31.9 g/dL (ref 30.0–36.0)
MCV: 85.4 fL (ref 78.0–100.0)
Monocytes Absolute: 0.6 10*3/uL (ref 0.1–1.0)
Monocytes Relative: 6 %
NEUTROS PCT: 84 %
Neutro Abs: 9.3 10*3/uL — ABNORMAL HIGH (ref 1.7–7.7)
PLATELETS: 276 10*3/uL (ref 150–400)
RBC: 3.23 MIL/uL — AB (ref 3.87–5.11)
RDW: 15.6 % — AB (ref 11.5–15.5)
WBC: 11 10*3/uL — AB (ref 4.0–10.5)

## 2015-06-24 LAB — URINE MICROSCOPIC-ADD ON

## 2015-06-24 LAB — RAPID STREP SCREEN (MED CTR MEBANE ONLY): Streptococcus, Group A Screen (Direct): NEGATIVE

## 2015-06-24 LAB — URINALYSIS, ROUTINE W REFLEX MICROSCOPIC
Bilirubin Urine: NEGATIVE
Glucose, UA: NEGATIVE mg/dL
KETONES UR: NEGATIVE mg/dL
NITRITE: NEGATIVE
PROTEIN: NEGATIVE mg/dL
Specific Gravity, Urine: 1.015 (ref 1.005–1.030)
pH: 6 (ref 5.0–8.0)

## 2015-06-24 MED ORDER — FLUCONAZOLE 200 MG PO TABS: 200.0000 mg | ORAL_TABLET | Freq: Every day | ORAL | Status: DC

## 2015-06-24 MED ORDER — KETOROLAC TROMETHAMINE 30 MG/ML IJ SOLN
30.0000 mg | Freq: Once | INTRAMUSCULAR | Status: AC
  Administered 2015-06-24: 30 mg via INTRAMUSCULAR

## 2015-06-24 MED ORDER — MAGIC MOUTHWASH
5.0000 mL | Freq: Once | ORAL | Status: AC
  Administered 2015-06-24: 5 mL via ORAL
  Filled 2015-06-24: qty 5

## 2015-06-24 MED ORDER — LACTATED RINGERS IV BOLUS (SEPSIS): 500.0000 mL | Freq: Once | INTRAVENOUS | Status: DC

## 2015-06-24 MED ORDER — MAGIC MOUTHWASH W/LIDOCAINE: 5.0000 mL | Freq: Three times a day (TID) | ORAL | Status: DC | PRN

## 2015-06-24 MED ORDER — LIDOCAINE VISCOUS 2 % MT SOLN
15.0000 mL | Freq: Once | OROMUCOSAL | Status: AC
  Administered 2015-06-24: 15 mL via OROMUCOSAL
  Filled 2015-06-24: qty 15

## 2015-06-24 MED ORDER — LACTATED RINGERS IV SOLN: INTRAVENOUS | Status: DC

## 2015-06-24 MED ORDER — ACETAMINOPHEN 500 MG PO TABS
1000.0000 mg | ORAL_TABLET | Freq: Once | ORAL | Status: AC
  Administered 2015-06-24: 1000 mg via ORAL
  Filled 2015-06-24: qty 2

## 2015-06-24 MED ORDER — KETOROLAC TROMETHAMINE 30 MG/ML IJ SOLN
30.0000 mg | Freq: Once | INTRAMUSCULAR | Status: DC
  Filled 2015-06-24: qty 1

## 2015-06-24 NOTE — MAU Provider Note (Signed)
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  Patient states that she was seen at urgent care today for thrush and pedial swelling.  Patient states she received throat lozenges, but "they feel like razor blades on my tongue."  Patient also states she received lasix, but was advised not to take it until tomorrow.  Patient states fever started around 2100 and has been steady increasing.  Patient denies taking any medication for fever and reports taking percocet and ibuprofen at 1700.  Patient states mouth and abdominal pain is 8-9/10.  Patient husband states she has not "really been eating in the last two days."  Patient reports she has not had a bowel movement since Monday, but states this is not abnormal for me.    Patient Active Problem List   Diagnosis Date Noted  . Pelvic pain in female 06/19/2015    Chief Complaint  Patient presents with  . Fever   HPI  OB History    Gravida Para Term Preterm AB TAB SAB Ectopic Multiple Living   Past Medical History  Diagnosis Date  . Anxiety   . Depression   . ADHD (attention deficit hyperactivity disorder)   . GERD (gastroesophageal reflux disease)   . Headache     otc med prn - last one 2 months ago  . Family history of adverse reaction to anesthesia     sister has PONV  . ADHD, adult residual type     Past Surgical History  Procedure Laterality Date  . Cesarean section      x 2  . Tonsillectomy    . Laparoscopic vaginal hysterectomy with salpingectomy Bilateral 06/19/2015    Procedure: LAPAROSCOPIC ASSISTED VAGINAL HYSTERECTOMY WITH SALPINGECTOMY;  Surgeon: Jaymes Graff, MD;  Location: [facility];  Service: Gynecology;  Laterality: Bilateral;  . Cystoscopy N/A 06/19/2015    Procedure: CYSTOSCOPY;  Surgeon: Jaymes Graff, MD;  Location: [facility];  Service: Gynecology;  Laterality: N/A;    No family history on file.  Social  History  Substance Use Topics  . Smoking status: Never Smoker   . Smokeless tobacco: Never Used  . Alcohol Use: Yes     Comment: occasional liquor    Allergies:  Allergies  Allergen Reactions  . Asa [Aspirin]     pain  . Macrobid [employer] Macro] Other (See Comments)    Stomach pain  . Naproxen Nausea And Vomiting and Other (See Comments)    cramping  . Penicillins Hives    Has patient had a PCN reaction causing immediate rash, facial/tongue/throat swelling, SOB or lightheadedness with hypotension: No Has patient had a PCN reaction causing severe rash involving mucus membranes or skin necrosis: No Has patient had a PCN reaction that required hospitalization No Has patient had a PCN reaction occurring within the last 10 years: No If all of the above answers are "NO", then may proceed with Cephalosporin use.  Lyman Bishop [Pseudoephedrine Hcl] Hives    Prescriptions prior to admission  Medication Sig Dispense Refill Last Dose  . ALPRAZolam (XANAX) 0.5 MG tablet Take 0.25-0.5 tablets by mouth 2 (two) times daily as needed.   06/19/2015 at 0700  . amphetamine-dextroamphetamine (ADDERALL XR) 15 MG 24 hr capsule Take 15 mg by mouth every morning.   Past Month at Unknown time  . citalopram (CELEXA)  20 MG tablet Take 20 mg by mouth daily.   06/19/2015 at 0700  . cyclobenzaprine (FLEXERIL) 10 MG tablet Take 1 tablet (10 mg total) by mouth 2 (two) times daily as needed for muscle spasms. (Patient not taking: Reported on 09/15/2014) 20 tablet 0   . cyclobenzaprine (FLEXERIL) 5 MG tablet Take 1 tablet (5 mg total) by mouth 3 (three) times daily as needed for muscle spasms (severe back pain). 30 tablet 0   . ibuprofen (ADVIL,MOTRIN) 600 MG tablet 1 po  pc every 6 hours for 5 days then as needed for pain 30 tablet 1   . oxyCODONE-acetaminophen (PERCOCET/ROXICET) 5-325 MG tablet Take 1-2 tablets by mouth every 4 (four) hours as needed for severe pain (moderate to severe pain (when  tolerating fluids)). 30 tablet 0   . pantoprazole (PROTONIX) 40 MG tablet Take 1 tablet by mouth 2 (two) times daily.    06/19/2015 at 0700    ROS  See HPI Above Physical Exam   Blood pressure 154/73, pulse 127, temperature 101.7 F (38.7 C), temperature source Oral, resp. rate 20, last menstrual period 06/04/2015, SpO2 100 %.  Results for orders placed or performed during the hospital encounter of 06/24/15 (from the past 72 hour(s))  Urinalysis, Routine w reflex microscopic (not at [facility])     Status: Abnormal   Collection Time: 06/24/15 12:25 AM  Result Value Ref Range   Color, Urine YELLOW YELLOW   APPearance CLEAR CLEAR   Specific Gravity, Urine 1.015 1.005 - 1.030   pH 6.0 5.0 - 8.0   Glucose, UA NEGATIVE NEGATIVE mg/dL   Hgb urine dipstick SMALL (A) NEGATIVE   Bilirubin Urine NEGATIVE NEGATIVE   Ketones, ur NEGATIVE NEGATIVE mg/dL   Protein, ur NEGATIVE NEGATIVE mg/dL   Nitrite NEGATIVE NEGATIVE   Leukocytes, UA SMALL (A) NEGATIVE  Urine microscopic-add on     Status: Abnormal   Collection Time: 06/24/15 12:25 AM  Result Value Ref Range   Squamous Epithelial / LPF 0-5 (A) NONE SEEN   WBC, UA 0-5 0 - 5 WBC/hpf   RBC / HPF 0-5 0 - 5 RBC/hpf   Bacteria, UA RARE (A) NONE SEEN   Urine-Other MUCOUS PRESENT   Rapid strep screen (not at [facility])     Status: None   Collection Time: 06/24/15  1:35 AM  Result Value Ref Range   Streptococcus, Group A Screen (Direct) NEGATIVE NEGATIVE    Comment: (NOTE) A Rapid Antigen test may result negative if the antigen level in the sample is below the detection level of this test. The FDA has not cleared this test as a stand-alone test therefore the rapid antigen negative result has reflexed to a Group A Strep culture.   CBC with Differential/Platelet     Status: Abnormal   Collection Time: 06/24/15  1:42 AM  Result Value Ref Range   WBC 11.0 (H) 4.0 - 10.5 K/uL   RBC 3.23 (L) 3.87 - 5.11 MIL/uL   Hemoglobin 8.8 (L) 12.0 - 15.0 g/dL   HCT  16.1 (L) 09.6 - 46.0 %   MCV 85.4 78.0 - 100.0 fL   MCH 27.2 26.0 - 34.0 pg   MCHC 31.9 30.0 - 36.0 g/dL   RDW 04.5 (H) 40.9 - 81.1 %   Platelets 276 150 - 400 K/uL   Neutrophils Relative % 84 %   Neutro Abs 9.3 (H) 1.7 - 7.7 K/uL   Lymphocytes Relative 10 %   Lymphs Abs 1.0 0.7 - 4.0 K/uL  Monocytes Relative 6 %   Monocytes Absolute 0.6 0.1 - 1.0 K/uL   Eosinophils Relative 0 %   Eosinophils Absolute 0.0 0.0 - 0.7 K/uL   Basophils Relative 0 %   Basophils Absolute 0.0 0.0 - 0.1 K/uL    Physical Exam  Constitutional: She is oriented to person, place, and time. She appears well-developed and well-nourished. She appears distressed.  HENT:  Head: Normocephalic and atraumatic.  Mouth/Throat: Mucous membranes are dry.  Thick white plaques on tongue, mucosa, and lips  Eyes: Conjunctivae are normal.  Neck: Normal range of motion.  Cardiovascular: Normal rate, regular rhythm and normal heart sounds.   Respiratory: Effort normal and breath sounds normal. No respiratory distress.  GI: Soft. She exhibits no distension. There is tenderness.  Musculoskeletal: Normal range of motion. She exhibits edema (+2 Pitting in BLE).  Neurological: She is alert and oriented to person, place, and time.  Skin: Skin is warm and dry.    ED Course  Assessment: 35 y.o. Female S/P LAVH Fever Edema Oral Candidiasis  Plan: -Labs: UA, Strep Culture, and CBC with Diff -Attempted to start IV (x3) for fluid bolus and IV pain medications, unsuccessful -IM Toradol -Magic mouthwash with lidocaine -Oral Hydration  Follow Up (0250) -Reports improvement of pain and mouth with interventions -RX for fluconazole 200mg  daily x 7 days for thrush -RX for magic mouthwash with lidocaine for patient comfort -Give tylenol XR prior to discharge -Informed of need for f/u in office with Dr. ND -Hold on taking lasix that was given at urgent care until after appt with Dr. ND -Educated on ways to reduce edema including  increased hydration and elevation -Encouraged to call if any questions or concerns arise prior to next scheduled office visit.  -Message sent to [facility] to schedule appts accordingly -Discharged to home in improved condition  Cherre RobinsJessica L Erastus Bartolomei CNM, MSN 06/24/2015 1:27 AM

## 2015-06-24 NOTE — Discharge Instructions (Signed)
Fever, Adult A fever is an increase in the body's temperature. It is usually defined as a temperature of 100F (38C) or higher. Brief mild or moderate fevers generally have no long-term effects, and they often do not require treatment. Moderate or high fevers may make you feel uncomfortable and can sometimes be a sign of a serious illness or disease. The sweating that may occur with repeated or prolonged fever may also cause dehydration. Fever is confirmed by taking a temperature with a thermometer. A measured temperature can vary with:  Age.  Time of day.  Location of the thermometer:  Mouth (oral).  Rectum (rectal).  Ear (tympanic).  Underarm (axillary).  Forehead (temporal). HOME CARE INSTRUCTIONS Pay attention to any changes in your symptoms. Take these actions to help with your condition:  Take over-the counter and prescription medicines only as told by your health care provider. Follow the dosing instructions carefully.  If you were prescribed an antibiotic medicine, take it as told by your health care provider. Do not stop taking the antibiotic even if you start to feel better.  Rest as needed.  Drink enough fluid to keep your urine clear or pale yellow. This helps to prevent dehydration.  Sponge yourself or bathe with room-temperature water to help reduce your body temperature as needed. Do not use ice water.  Do not overbundle yourself in blankets or heavy clothes. SEEK MEDICAL CARE IF:  You vomit.  You cannot eat or drink without vomiting.  You have diarrhea.  You have pain when you urinate.  Your symptoms do not improve with treatment.  You develop new symptoms.  You develop excessive weakness. SEEK IMMEDIATE MEDICAL CARE IF:  You have shortness of breath or have trouble breathing.  You are dizzy or you faint.  You are disoriented or confused.  You develop signs of dehydration, such as a dry mouth, decreased urination, or paleness.  You develop  severe pain in your abdomen.  You have persistent vomiting or diarrhea.  You develop a skin rash.  Your symptoms suddenly get worse.   This information is not intended to replace advice given to you by your health care provider. Make sure you discuss any questions you have with your health care provider.   Document Released: 08/13/2000 Document Revised: 11/08/2014 Document Reviewed: 04/13/2014 Elsevier Interactive Patient Education 2016 [employer]. [place], Adult Ginette Pitman, also called oral candidiasis, is a fungal infection that develops in the mouth and throat and on the tongue. It causes white patches to form on the mouth and tongue. Ginette Pitman is most common in older adults, but it can occur at any age.  Many cases of thrush are mild, but this infection can also be more serious. Ginette Pitman can be a recurring problem for people who have chronic illnesses or who take medicines that limit the body's ability to fight infection. Because these people have difficulty fighting infections, the fungus that causes thrush can spread throughout the body. This can cause life-threatening blood or organ infections. CAUSES  Ginette Pitman is usually caused by a yeast called Candida albicans. This fungus is normally present in small amounts in the mouth and on other mucous membranes. It usually causes no harm. However, when conditions are present that allow the fungus to grow uncontrolled, it invades surrounding tissues and becomes an infection. Less often, other Candida species can also lead to thrush.  RISK FACTORS Ginette Pitman is more likely to develop in the following people:  People with an impaired ability to fight infection (weakened immune  system).   Older adults.   People with HIV.   People with diabetes.   People with dry mouth (xerostomia).   Pregnant women.   People with poor dental care, especially those who have false teeth.   People who use antibiotic medicines.  SIGNS AND SYMPTOMS  Ginette Pitman can  be a mild infection that causes no symptoms. If symptoms develop, they may include:   A burning feeling in the mouth and throat. This can occur at the start of a thrush infection.   White patches that adhere to the mouth and tongue. The tissue around the patches may be red, raw, and painful. If rubbed (during tooth brushing, for example), the patches and the tissue of the mouth may bleed easily.   A bad taste in the mouth or difficulty tasting foods.   Cottony feeling in the mouth.   Pain during eating and swallowing. DIAGNOSIS  Your health care provider can usually diagnose thrush by looking in your mouth and asking you questions about your health.  TREATMENT  Medicines that help prevent the growth of fungi (antifungals) are the standard treatment for thrush. These medicines are either applied directly to the affected area (topical) or swallowed (oral). The treatment will depend on the severity of the condition.  Mild Thrush Mild cases of thrush may clear up with the use of an antifungal mouth rinse or lozenges. Treatment usually lasts about 14 days.  Moderate to Severe Thrush  More severe thrush infections that have spread to the esophagus are treated with an oral antifungal medicine. A topical antifungal medicine may also be used.   For some severe infections, a treatment period longer than 14 days may be needed.   Oral antifungal medicines are almost never used during pregnancy because the fetus may be harmed. However, if a pregnant woman has a rare, severe thrush infection that has spread to her blood, oral antifungal medicines may be used. In this case, the risk of harm to the mother and fetus from the severe thrush infection may be greater than the risk posed by the use of antifungal medicines.  Persistent or Recurrent Thrush For cases of thrush that do not go away or keep coming back, treatment may involve the following:   Treatment may be needed twice as long as the  symptoms last.   Treatment will include both oral and topical antifungal medicines.   People with weakened immune systems can take an antifungal medicine on a continuous basis to prevent thrush infections.  It is important to treat conditions that make you more likely to get thrush, such as diabetes or HIV.  HOME CARE INSTRUCTIONS   Only take over-the-counter or prescription medicine as directed by your health care provider. Talk to your health care provider about an over-the-counter medicine called gentian violet, which kills bacteria and fungi.   Eat plain, unflavored yogurt as directed by your health care provider. Check the label to make sure the yogurt contains live cultures. This yogurt can help healthy bacteria grow in the mouth that can stop the growth of the fungus that causes thrush.   Try these measures to help reduce the discomfort of thrush:   Drink cold liquids such as water or iced tea.   Try flavored ice treats or frozen juices.   Eat foods that are easy to swallow, such as gelatin, ice cream, or custard.   If the patches in your mouth are painful, try drinking from a straw.   Rinse your mouth several  times a day with a warm saltwater rinse. You can make the saltwater mixture with 1 tsp (6 g) of salt in 8 fl oz (0.2 L) of warm water.   If you wear dentures, remove the dentures before going to bed, brush them vigorously, and soak them in a cleaning solution as directed by your health care provider.   Women who are breastfeeding should clean their nipples with an antifungal medicine as directed by their health care provider. Dry the nipples after breastfeeding. Applying lanolin-containing body lotion may help relieve nipple soreness.  SEEK MEDICAL CARE IF:  Your symptoms are getting worse or are not improving within 7 days of starting treatment.   You have symptoms of spreading infection, such as white patches on the skin outside of the mouth.   You are  nursing and you have redness, burning, or pain in the nipples that is not relieved with treatment.  MAKE SURE YOU:  Understand these instructions.  Will watch your condition.  Will get help right away if you are not doing well or get worse.   This information is not intended to replace advice given to you by your health care provider. Make sure you discuss any questions you have with your health care provider.   Document Released: 11/13/2003 Document Revised: 03/10/2014 Document Reviewed: 09/20/2012 Elsevier Interactive Patient Education [employer]2016 Elsevier Inc.

## 2015-06-24 NOTE — MAU Note (Addendum)
Pt reports having abd and back pain and pain in her mouth.  Had increased swelling her legs since yesterday.Went to urgent care and told she had thrush. Given som logeges to use but they sting to bad. Lasix prescribed for swelling to start tommorow. Pt started feeling worse tonight and took temp at home and it was 100.6.

## 2015-06-26 ENCOUNTER — Other Ambulatory Visit: Payer: Self-pay | Admitting: Obstetrics and Gynecology

## 2015-06-26 DIAGNOSIS — R0602 Shortness of breath: Secondary | ICD-10-CM

## 2015-06-26 LAB — CULTURE, GROUP A STREP (THRC)

## 2015-06-27 ENCOUNTER — Ambulatory Visit (HOSPITAL_COMMUNITY)
Admission: RE | Admit: 2015-06-27 | Discharge: 2015-06-27 | Disposition: A | Discharge status: Home / Self Care | Payer: BLUE CROSS/BLUE SHIELD | Source: Ambulatory Visit | Attending: Obstetrics and Gynecology | Admitting: Obstetrics and Gynecology

## 2015-06-27 ENCOUNTER — Ambulatory Visit
Admission: RE | Admit: 2015-06-27 | Discharge: 2015-06-27 | Disposition: A | Discharge status: Home / Self Care | Payer: BLUE CROSS/BLUE SHIELD | Source: Ambulatory Visit | Attending: Obstetrics and Gynecology | Admitting: Obstetrics and Gynecology

## 2015-06-27 ENCOUNTER — Other Ambulatory Visit (HOSPITAL_COMMUNITY): Payer: Self-pay | Admitting: Obstetrics and Gynecology

## 2015-06-27 ENCOUNTER — Encounter (HOSPITAL_COMMUNITY): Payer: Self-pay

## 2015-06-27 DIAGNOSIS — R590 Localized enlarged lymph nodes: Secondary | ICD-10-CM | POA: Insufficient documentation

## 2015-06-27 DIAGNOSIS — J90 Pleural effusion, not elsewhere classified: Secondary | ICD-10-CM | POA: Diagnosis not present

## 2015-06-27 DIAGNOSIS — R0602 Shortness of breath: Secondary | ICD-10-CM

## 2015-06-27 MED ORDER — IOPAMIDOL (ISOVUE-370) INJECTION 76%
INTRAVENOUS | Status: AC
  Administered 2015-06-27: 100 mL
  Filled 2015-06-27: qty 100

## 2015-11-17 ENCOUNTER — Encounter (HOSPITAL_COMMUNITY): Payer: Self-pay

## 2015-11-17 DIAGNOSIS — Z791 Long term (current) use of non-steroidal anti-inflammatories (NSAID): Secondary | ICD-10-CM | POA: Insufficient documentation

## 2015-11-17 DIAGNOSIS — F908 Attention-deficit hyperactivity disorder, other type: Secondary | ICD-10-CM | POA: Diagnosis not present

## 2015-11-17 DIAGNOSIS — Z79899 Other long term (current) drug therapy: Secondary | ICD-10-CM | POA: Diagnosis not present

## 2015-11-17 DIAGNOSIS — R109 Unspecified abdominal pain: Secondary | ICD-10-CM | POA: Insufficient documentation

## 2015-11-17 DIAGNOSIS — R110 Nausea: Secondary | ICD-10-CM | POA: Insufficient documentation

## 2015-11-17 DIAGNOSIS — R319 Hematuria, unspecified: Secondary | ICD-10-CM | POA: Diagnosis not present

## 2015-11-17 NOTE — ED Triage Notes (Signed)
Left flank started today with hematuria and nausea voiced no trauma to back area no fever vocied.

## 2015-11-18 ENCOUNTER — Emergency Department (HOSPITAL_COMMUNITY): Payer: BLUE CROSS/BLUE SHIELD

## 2015-11-18 ENCOUNTER — Emergency Department (HOSPITAL_COMMUNITY)
Admission: EM | Admit: 2015-11-18 | Discharge: 2015-11-18 | Disposition: A | Discharge status: Home / Self Care | Payer: BLUE CROSS/BLUE SHIELD | Attending: Emergency Medicine | Admitting: Emergency Medicine

## 2015-11-18 DIAGNOSIS — R109 Unspecified abdominal pain: Secondary | ICD-10-CM

## 2015-11-18 LAB — URINALYSIS, ROUTINE W REFLEX MICROSCOPIC
Bilirubin Urine: NEGATIVE
GLUCOSE, UA: NEGATIVE mg/dL
Hgb urine dipstick: NEGATIVE
KETONES UR: NEGATIVE mg/dL
Nitrite: NEGATIVE
PROTEIN: NEGATIVE mg/dL
Specific Gravity, Urine: 1.026 (ref 1.005–1.030)
pH: 5.5 (ref 5.0–8.0)

## 2015-11-18 LAB — POC URINE PREG, ED: Preg Test, Ur: NEGATIVE

## 2015-11-18 LAB — URINE MICROSCOPIC-ADD ON
Bacteria, UA: NONE SEEN
RBC / HPF: NONE SEEN RBC/hpf (ref 0–5)

## 2015-11-18 MED ORDER — METHOCARBAMOL 500 MG PO TABS: 500.0000 mg | ORAL_TABLET | Freq: Two times a day (BID) | ORAL | 0 refills | Status: DC

## 2015-11-18 MED ORDER — MORPHINE SULFATE (PF) 4 MG/ML IV SOLN
4.0000 mg | Freq: Once | INTRAVENOUS | Status: AC
  Administered 2015-11-18: 4 mg via INTRAVENOUS
  Filled 2015-11-18: qty 1

## 2015-11-18 MED ORDER — ONDANSETRON HCL 4 MG/2ML IJ SOLN
4.0000 mg | Freq: Once | INTRAMUSCULAR | Status: AC
  Administered 2015-11-18: 4 mg via INTRAVENOUS
  Filled 2015-11-18: qty 2

## 2015-11-18 MED ORDER — SODIUM CHLORIDE 0.9 % IV BOLUS (SEPSIS)
1000.0000 mL | Freq: Once | INTRAVENOUS | Status: AC
  Administered 2015-11-18: 1000 mL via INTRAVENOUS

## 2015-11-18 NOTE — Discharge Instructions (Signed)
Please read and follow all provided instructions.  Your diagnoses today include:  1. Left flank pain    Tests performed today include: Vital signs. See below for your results today.   Medications prescribed:  Take as prescribed   Home care instructions:  Follow any educational materials contained in this packet.  Follow-up instructions: Please follow-up with your primary care provider for further evaluation of symptoms and treatment   Return instructions:  Please return to the Emergency Department if you do not get better, if you get worse, or new symptoms OR  - Fever (temperature greater than 101.91F)  - Bleeding that does not stop with holding pressure to the area    -Severe pain (please note that you may be more sore the day after your accident)  - Chest Pain  - Difficulty breathing  - Severe nausea or vomiting  - Inability to tolerate food and liquids  - Passing out  - Skin becoming red around your wounds  - Change in mental status (confusion or lethargy)  - New numbness or weakness    Please return if you have any other emergent concerns.  Additional Information:  Your vital signs today were: BP 133/80 (BP Location: Right Arm)    Pulse 81    Temp 98.4 F (36.9 C) (Oral)    Resp 18    Ht 5\' 5"  (1.651 m)    Wt 113.4 kg    LMP 06/12/2015 Comment: negative pregnancy test result today.   SpO2 100%    BMI 41.60 kg/m  If your blood pressure (BP) was elevated above 135/85 this visit, please have this repeated by your doctor within one month. ---------------

## 2015-11-18 NOTE — ED Provider Notes (Signed)
[facility]-EMERGENCY DEPT Provider Note   CSN: [id number] Arrival date & time: 11/17/15  2333  By signing my name below, I, Modena Jansky, attest that this documentation has been prepared under the direction and in the presence of non-physician practitioner, Audry Pili, PA-C. Electronically Signed: Modena Jansky, Scribe. 11/18/2015. 1:50 AM.  History   Chief Complaint Chief Complaint  Patient presents with  . Flank Pain   The history is provided by the patient. No language interpreter was used.   HPI Comments: [name] is a 35 y.o. female who presents to the Emergency Department complaining of constant moderate left flank pain that started today. Pt states she had a hysterectomy in April, and had bladder pain and mild supposed hematuria since then when she wipes occasionally. She describes today's flank pain as a shooting sensation, 8/10 in severity, and exacerbated by ambulation. Unsure of lifting injury. Notes no trauma to area. Associated symptoms include mild abdominal pain, and nausea. No treatment PTA. Denies hx of kidney stones, fever, vomiting, or any other complaints.   Past Medical History:  Diagnosis Date  . ADHD (attention deficit hyperactivity disorder)   . ADHD, adult residual type   . Anxiety   . Depression   . Family history of adverse reaction to anesthesia    sister has PONV  . GERD (gastroesophageal reflux disease)   . Headache    otc med prn - last one 2 months ago    Patient Active Problem List   Diagnosis Date Noted  . Pelvic pain in female 06/19/2015    Past Surgical History:  Procedure Laterality Date  . CESAREAN SECTION     x 2  . CYSTOSCOPY N/A 06/19/2015   Procedure: CYSTOSCOPY;  Surgeon: Jaymes Graff, MD;  Location: [facility];  Service: Gynecology;  Laterality: N/A;  . LAPAROSCOPIC VAGINAL HYSTERECTOMY WITH SALPINGECTOMY Bilateral 06/19/2015   Procedure: LAPAROSCOPIC ASSISTED VAGINAL HYSTERECTOMY WITH SALPINGECTOMY;  Surgeon: Jaymes Graff, MD;   Location: [facility];  Service: Gynecology;  Laterality: Bilateral;  . TONSILLECTOMY      OB History    Gravida Para Term Preterm AB Living   2 2 2     2    SAB TAB Ectopic Multiple Live Births                   Home Medications    Prior to Admission medications   Medication Sig Start Date End Date Taking? Authorizing Provider  ALPRAZolam Prudy Feeler) 0.5 MG tablet Take 0.25-0.5 tablets by mouth 2 (two) times daily as needed. 09/06/14   Historical Provider, MD  amphetamine-dextroamphetamine (ADDERALL XR) 15 MG 24 hr capsule Take 15 mg by mouth every morning.    Historical Provider, MD  citalopram (CELEXA) 20 MG tablet Take 20 mg by mouth daily.    Historical Provider, MD  cyclobenzaprine (FLEXERIL) 10 MG tablet Take 1 tablet (10 mg total) by mouth 2 (two) times daily as needed for muscle spasms. Patient not taking: Reported on 09/15/2014 01/13/14   Fayrene Helper, PA-C  cyclobenzaprine (FLEXERIL) 5 MG tablet Take 1 tablet (5 mg total) by mouth 3 (three) times daily as needed for muscle spasms (severe back pain). 06/20/15   Elmira Powell, PA-C  fluconazole (DIFLUCAN) 200 MG tablet Take 1 tablet (200 mg total) by mouth daily. For 7 days 06/24/15   Gerrit Heck, CNM  ibuprofen (ADVIL,MOTRIN) 600 MG tablet 1 po  pc every 6 hours for 5 days then as needed for pain 06/20/15   Henreitta Leber, PA-C  magic mouthwash w/lidocaine SOLN Take 5 mLs by mouth 3 (three) times daily as needed for mouth pain. 06/24/15   Gerrit HeckJessica Emly, CNM  oxyCODONE-acetaminophen (PERCOCET/ROXICET) 5-325 MG tablet Take 1-2 tablets by mouth every 4 (four) hours as needed for severe pain (moderate to severe pain (when tolerating fluids)). 06/20/15   Elmira Powell, PA-C  pantoprazole (PROTONIX) 40 MG tablet Take 1 tablet by mouth 2 (two) times daily.  07/17/14   Historical Provider, MD    Family History History reviewed. No pertinent family history.  Social History Social History  Substance Use Topics  . Smoking status: Never Smoker  .  Smokeless tobacco: Never Used  . Alcohol use Yes     Comment: occasional liquor     Allergies   Asa [aspirin]; Macrobid [nitrofurantoin monohyd macro]; Naproxen; Penicillins; and Sudafed [pseudoephedrine hcl]   Review of Systems Review of Systems A complete 10 system review of systems was obtained and all systems are negative except as noted in the HPI and PMH.   Physical Exam Updated Vital Signs BP (!) 163/110 (BP Location: Left Arm)   Pulse 86   Temp 98.4 F (36.9 C) (Oral)   Resp 20   Ht 5\' 5"  (1.651 m)   Wt 250 lb (113.4 kg)   LMP 06/12/2015   SpO2 98%   BMI 41.60 kg/m   Physical Exam  Constitutional: She appears well-developed and well-nourished. No distress.  HENT:  Head: Normocephalic.  Eyes: Conjunctivae are normal.  Neck: Neck supple.  Cardiovascular: Normal rate and regular rhythm.   Pulmonary/Chest: Effort normal and breath sounds normal. No respiratory distress. She has no wheezes.  Abdominal: Soft. She exhibits no distension. There is no tenderness.  TTP along the left flank. No midline paraspinous TTP. Abdomen is non-tender and non-distended.   Musculoskeletal: Normal range of motion.  Neurological: She is alert.  Skin: Skin is warm and dry.  Psychiatric: She has a normal mood and affect.  Nursing note and vitals reviewed.  ED Treatments / Results  DIAGNOSTIC STUDIES: Oxygen Saturation is 98% on RA, normal by my interpretation.    COORDINATION OF CARE: 1:54 AM- Pt advised of plan for treatment and pt agrees.  Labs (all labs ordered are listed, but only abnormal results are displayed) Labs Reviewed  URINALYSIS, ROUTINE W REFLEX MICROSCOPIC (NOT AT [facility]) - Abnormal; Notable for the following:       Result Value   Leukocytes, UA TRACE (*)    All other components within normal limits  URINE MICROSCOPIC-ADD ON - Abnormal; Notable for the following:    Squamous Epithelial / LPF 0-5 (*)    All other components within normal limits  POC URINE PREG,  ED    EKG  EKG Interpretation None       Radiology Ct Renal Stone Study  Result Date: 11/18/2015 CLINICAL DATA:  Acute onset of left flank pain, hematuria and nausea. Initial encounter. EXAM: CT ABDOMEN AND PELVIS WITHOUT CONTRAST TECHNIQUE: Multidetector CT imaging of the abdomen and pelvis was performed following the standard protocol without IV contrast. COMPARISON:  CT of the abdomen and pelvis from 12/11/2010, and lumbar spine radiographs performed 12/27/2013 FINDINGS: Lower chest: The visualized lung bases are grossly clear. The visualized portions of the mediastinum are unremarkable. Hepatobiliary: The liver is unremarkable in appearance. The gallbladder is unremarkable in appearance. The common bile duct remains normal in caliber. Pancreas: The pancreas is within normal limits. Spleen: The spleen is unremarkable in appearance. Adrenals/Urinary Tract: The adrenal glands are unremarkable  in appearance. The kidneys are within normal limits. There is no evidence of hydronephrosis. No renal or ureteral stones are identified. No perinephric stranding is seen. Stomach/Bowel: The stomach is unremarkable in appearance. The small bowel is within normal limits. The appendix is normal in caliber, without evidence of appendicitis. The colon is unremarkable in appearance. Vascular/Lymphatic: The abdominal aorta is unremarkable in appearance. The inferior vena cava is grossly unremarkable. No retroperitoneal lymphadenopathy is seen. No pelvic sidewall lymphadenopathy is identified. Reproductive: The bladder is mildly distended and grossly unremarkable. The patient is status post hysterectomy. No suspicious adnexal masses are seen. Other: No additional soft tissue abnormalities are seen. Musculoskeletal: No acute osseous abnormalities are identified. The visualized musculature is unremarkable in appearance. IMPRESSION: Unremarkable noncontrast CT of the abdomen and pelvis. Electronically Signed   By: Roanna Raider M.D.   On: 11/18/2015 02:41    Procedures Procedures (including critical care time)  Medications Ordered in ED Medications  ondansetron (ZOFRAN) injection 4 mg (not administered)  morphine 4 MG/ML injection 4 mg (not administered)  sodium chloride 0.9 % bolus 1,000 mL (1,000 mLs Intravenous New Bag/Given 11/18/15 0226)  morphine 4 MG/ML injection 4 mg (4 mg Intravenous Given 11/18/15 0214)   Initial Impression / Assessment and Plan / ED Course  I have reviewed the triage vital signs and the nursing notes.  Pertinent labs & imaging results that were available during my care of the patient were reviewed by me and considered in my medical decision making (see chart for details).  Clinical Course    Final Clinical Impressions(s) / ED Diagnoses  I have reviewed and evaluated the relevant laboratory values I have reviewed and evaluated the relevant imaging studies.  I have reviewed the relevant previous healthcare records.I obtained HPI from historian.  ED Course:  Assessment: Pt is a 35yF with hx hysterectomy who presents with left flank pain today. On exam, pt in NAD. Nontoxic/nonseptic appearing. VSS. Afebrile. Lungs CTA. Heart RRR. Abdomen nontender soft. Muscle tenderness on left flank. UA with no signs of infection or Hgb. CT Renal unremarkable. No hydronephrosis. TTP along left flank along musculature. Hematuria likely related to inflamed bladder from hysterectomy. NO gross blood on exam or UA. Pain likely musculoskeletal in etiology. Will have pt follow up with PCP for further eval after trial of muscle relaxants. Plan is to DC home with follow up to PCP. At time of discharge, Patient is in no acute distress. Vital Signs are stable. Patient is able to ambulate. Patient able to tolerate PO.    Disposition/Plan:  DC Home Additional Verbal discharge instructions given and discussed with patient.  Pt Instructed to f/u with PCP in the next week for evaluation and treatment of  symptoms. Return precautions given Pt acknowledges and agrees with plan  Supervising Physician April Palumbo, MD   Final diagnoses:  Left flank pain    New Prescriptions New Prescriptions   No medications on file   I personally performed the services described in this documentation, which was scribed in my presence. The recorded information has been reviewed and is accurate.     Audry Pili, PA-C 11/18/15 [id number]    April Palumbo, MD 11/18/15 [phone]

## 2016-02-14 ENCOUNTER — Inpatient Hospital Stay (HOSPITAL_COMMUNITY)
Admission: AD | Admit: 2016-02-14 | Discharge: 2016-02-15 | Disposition: A | Discharge status: Home / Self Care | Payer: BLUE CROSS/BLUE SHIELD | Source: Ambulatory Visit | Attending: Obstetrics and Gynecology | Admitting: Obstetrics and Gynecology

## 2016-02-14 ENCOUNTER — Encounter (HOSPITAL_COMMUNITY): Payer: Self-pay

## 2016-02-14 DIAGNOSIS — B9689 Other specified bacterial agents as the cause of diseases classified elsewhere: Secondary | ICD-10-CM | POA: Diagnosis not present

## 2016-02-14 DIAGNOSIS — N760 Acute vaginitis: Secondary | ICD-10-CM | POA: Insufficient documentation

## 2016-02-14 DIAGNOSIS — N83201 Unspecified ovarian cyst, right side: Secondary | ICD-10-CM | POA: Diagnosis not present

## 2016-02-14 DIAGNOSIS — R1031 Right lower quadrant pain: Secondary | ICD-10-CM | POA: Insufficient documentation

## 2016-02-14 DIAGNOSIS — R1032 Left lower quadrant pain: Secondary | ICD-10-CM | POA: Diagnosis present

## 2016-02-14 DIAGNOSIS — R102 Pelvic and perineal pain: Secondary | ICD-10-CM

## 2016-02-14 LAB — URINALYSIS, ROUTINE W REFLEX MICROSCOPIC
Bilirubin Urine: NEGATIVE
Glucose, UA: NEGATIVE mg/dL
Hgb urine dipstick: NEGATIVE
KETONES UR: NEGATIVE mg/dL
LEUKOCYTES UA: NEGATIVE
NITRITE: NEGATIVE
PH: 6 (ref 5.0–8.0)
PROTEIN: NEGATIVE mg/dL
Specific Gravity, Urine: 1.021 (ref 1.005–1.030)

## 2016-02-14 NOTE — MAU Note (Signed)
Pt reports at 1830 she started having lower abd pain and pressure.

## 2016-02-15 ENCOUNTER — Inpatient Hospital Stay (HOSPITAL_COMMUNITY): Payer: BLUE CROSS/BLUE SHIELD

## 2016-02-15 DIAGNOSIS — N83201 Unspecified ovarian cyst, right side: Secondary | ICD-10-CM

## 2016-02-15 LAB — WET PREP, GENITAL
SPERM: NONE SEEN
Trich, Wet Prep: NONE SEEN
YEAST WET PREP: NONE SEEN

## 2016-02-15 LAB — CBC
HCT: 34.8 % — ABNORMAL LOW (ref 36.0–46.0)
HEMOGLOBIN: 11.6 g/dL — AB (ref 12.0–15.0)
MCH: 29.4 pg (ref 26.0–34.0)
MCHC: 33.3 g/dL (ref 30.0–36.0)
MCV: 88.1 fL (ref 78.0–100.0)
Platelets: 360 10*3/uL (ref 150–400)
RBC: 3.95 MIL/uL (ref 3.87–5.11)
RDW: 14.5 % (ref 11.5–15.5)
WBC: 9.9 10*3/uL (ref 4.0–10.5)

## 2016-02-15 MED ORDER — KETOROLAC TROMETHAMINE 60 MG/2ML IM SOLN
60.0000 mg | Freq: Once | INTRAMUSCULAR | Status: AC
  Administered 2016-02-15: 60 mg via INTRAMUSCULAR
  Filled 2016-02-15: qty 2

## 2016-02-15 MED ORDER — KETOROLAC TROMETHAMINE 10 MG PO TABS: 10.0000 mg | ORAL_TABLET | Freq: Four times a day (QID) | ORAL | 0 refills | Status: DC | PRN

## 2016-02-15 MED ORDER — ONDANSETRON 8 MG PO TBDP: 8.0000 mg | ORAL_TABLET | Freq: Three times a day (TID) | ORAL | 0 refills | Status: DC | PRN

## 2016-02-15 MED ORDER — METRONIDAZOLE 500 MG PO TABS: 500.0000 mg | ORAL_TABLET | Freq: Two times a day (BID) | ORAL | 0 refills | Status: DC

## 2016-02-15 MED ORDER — HYDROCODONE-ACETAMINOPHEN 5-325 MG PO TABS
2.0000 | ORAL_TABLET | Freq: Once | ORAL | Status: AC
  Administered 2016-02-15: 2 via ORAL
  Filled 2016-02-15: qty 2

## 2016-02-15 NOTE — Discharge Instructions (Signed)
Ovarian Cyst  An ovarian cyst is a fluid-filled sac that forms on an ovary. The ovaries are small organs that produce eggs in women. Various types of cysts can form on the ovaries. Some may cause symptoms and require treatment. Most ovarian cysts go away on their own, are not cancerous (are benign), and do not cause problems. Common types of ovarian cysts include:  Functional (follicle) cysts.  Occur during the menstrual cycle, and usually go away with the next menstrual cycle if you do not get pregnant.  Usually cause no symptoms.  Endometriomas.  Are cysts that form from the tissue that lines the uterus (endometrium).  Are sometimes called chocolate cysts because they become filled with blood that turns brown.  Can cause pain in the lower abdomen during intercourse and during your period.  Cystadenoma cysts.  Develop from cells on the outside surface of the ovary.  Can get very large and cause lower abdomen pain and pain with intercourse.  Can cause severe pain if they twist or break open (rupture).  Dermoid cysts.  Are sometimes found in both ovaries.  May contain different kinds of body tissue, such as skin, teeth, hair, or cartilage.  Usually do not cause symptoms unless they get very big.  Theca lutein cysts.  Occur when too much of a certain hormone (human chorionic gonadotropin) is produced and overstimulates the ovaries to produce an egg.  Are most common after having procedures used to assist with the conception of a baby (in vitro fertilization). What are the causes? Ovarian cysts may be caused by:  Ovarian hyperstimulation syndrome. This is a condition that can develop from taking fertility medicines. It causes multiple large ovarian cysts to form.  Polycystic ovarian syndrome (PCOS). This is a common hormonal disorder that can cause ovarian cysts, as well as problems with your period or fertility. What increases the risk? The following factors may make you  more likely to develop ovarian cysts:  Being overweight or obese.  Taking fertility medicines.  Taking certain forms of hormonal birth control.  Smoking. What are the signs or symptoms? Many ovarian cysts do not cause symptoms. If symptoms are present, they may include:  Pelvic pain or pressure.  Pain in the lower abdomen.  Pain during sex.  Abdominal swelling.  Abnormal menstrual periods.  Increasing pain with menstrual periods. How is this diagnosed? These cysts are commonly found during a routine pelvic exam. You may have tests to find out more about the cyst, such as:  Ultrasound.  X-ray of the pelvis.  CT scan.  MRI.  Blood tests. How is this treated? Many ovarian cysts go away on their own without treatment. Your health care provider may want to check your cyst regularly for 2-3 months to see if it changes. If you are in menopause, it is especially important to have your cyst monitored closely because menopausal women have a higher rate of ovarian cancer. When treatment is needed, it may include:  Medicines to help relieve pain.  A procedure to drain the cyst (aspiration).  Surgery to remove the whole cyst.  Hormone treatment or birth control pills. These methods are sometimes used to help dissolve a cyst. Follow these instructions at home:  Take over-the-counter and prescription medicines only as told by your health care provider.  Do not drive or use heavy machinery while taking prescription pain medicine.  Get regular pelvic exams and Pap tests as often as told by your health care provider.  Return to your  normal activities as told by your health care provider. Ask your health care provider what activities are safe for you.  Do not use any products that contain nicotine or tobacco, such as cigarettes and e-cigarettes. If you need help quitting, ask your health care provider.  Keep all follow-up visits as told by your health care provider. This is  important. Contact a health care provider if:  Your periods are late, irregular, or painful, or they stop.  You have pelvic pain that does not go away.  You have pressure on your bladder or trouble emptying your bladder completely.  You have pain during sex.  You have any of the following in your abdomen:  A feeling of fullness.  Pressure.  Discomfort.  Pain that does not go away.  Swelling.  You feel generally ill.  You become constipated.  You lose your appetite.  You develop severe acne.  You start to have more body hair and facial hair.  You are gaining weight or losing weight without changing your exercise and eating habits.  You think you may be pregnant. Get help right away if:  You have abdominal pain that is severe or gets worse.  You cannot eat or drink without vomiting.  You suddenly develop a fever.  Your menstrual period is much heavier than usual. This information is not intended to replace advice given to you by your health care provider. Make sure you discuss any questions you have with your health care provider. Document Released: 02/17/2005 Document Revised: 09/07/2015 Document Reviewed: 07/22/2015 Elsevier Interactive Patient Education  2017 [employer]   Bacterial Vaginosis Bacterial vaginosis is a vaginal infection that occurs when the normal balance of bacteria in the vagina is disrupted. It results from an overgrowth of certain bacteria. This is the most common vaginal infection among women ages 6715-44. Because bacterial vaginosis increases your risk for STIs (sexually transmitted infections), getting treated can help reduce your risk for chlamydia, gonorrhea, herpes, and HIV (human immunodeficiency virus). Treatment is also important for preventing complications in pregnant women, because this condition can cause an early (premature) delivery. What are the causes? This condition is caused by an increase in harmful bacteria that are  normally present in small amounts in the vagina. However, the reason that the condition develops is not fully understood. What increases the risk? The following factors may make you more likely to develop this condition:  Having a new sexual partner or multiple sexual partners.  Having unprotected sex.  Douching.  Having an intrauterine device (IUD).  Smoking.  Drug and alcohol abuse.  Taking certain antibiotic medicines.  Being pregnant. You cannot get bacterial vaginosis from toilet seats, bedding, swimming pools, or contact with objects around you. What are the signs or symptoms? Symptoms of this condition include:  Grey or white vaginal discharge. The discharge can also be watery or foamy.  A fish-like odor with discharge, especially after sexual intercourse or during menstruation.  Itching in and around the vagina.  Burning or pain with urination. Some women with bacterial vaginosis have no signs or symptoms. How is this diagnosed? This condition is diagnosed based on:  Your medical history.  A physical exam of the vagina.  Testing a sample of vaginal fluid under a microscope to look for a large amount of bad bacteria or abnormal cells. Your health care provider may use a cotton swab or a small wooden spatula to collect the sample. How is this treated? This condition is treated with antibiotics. These  may be given as a pill, a vaginal cream, or a medicine that is put into the vagina (suppository). If the condition comes back after treatment, a second round of antibiotics may be needed. Follow these instructions at home: Medicines  Take over-the-counter and prescription medicines only as told by your health care provider.  Take or use your antibiotic as told by your health care provider. Do not stop taking or using the antibiotic even if you start to feel better. General instructions  If you have a female sexual partner, tell her that you have a vaginal infection.  She should see her health care provider and be treated if she has symptoms. If you have a female sexual partner, he does not need treatment.  During treatment:  Avoid sexual activity until you finish treatment.  Do not douche.  Avoid alcohol as directed by your health care provider.  Avoid breastfeeding as directed by your health care provider.  Drink enough water and fluids to keep your urine clear or pale yellow.  Keep the area around your vagina and rectum clean.  Wash the area daily with warm water.  Wipe yourself from front to back after using the toilet.  Keep all follow-up visits as told by your health care provider. This is important. How is this prevented?  Do not douche.  Wash the outside of your vagina with warm water only.  Use protection when having sex. This includes latex condoms and dental dams.  Limit how many sexual partners you have. To help prevent bacterial vaginosis, it is best to have sex with just one partner (monogamous).  Make sure you and your sexual partner are tested for STIs.  Wear cotton or cotton-lined underwear.  Avoid wearing tight pants and pantyhose, especially during summer.  Limit the amount of alcohol that you drink.  Do not use any products that contain nicotine or tobacco, such as cigarettes and e-cigarettes. If you need help quitting, ask your health care provider.  Do not use illegal drugs. Where to find more information:  Centers for Disease Control and Prevention: SolutionApps.co.zawww.cdc.gov/std  American Sexual Health Association (ASHA): www.ashastd.org  [place] Department of Health and Health and safety inspectorHuman Services, Office on Women's Health: ConventionalMedicines.siwww.womenshealth.gov/ or http://www.anderson-williamson.info/https://www.womenshealth.gov/a-z-topics/bacterial-vaginosis Contact a health care provider if:  Your symptoms do not improve, even after treatment.  You have more discharge or pain when urinating.  You have a fever.  You have pain in your abdomen.  You have pain during sex.  You have  vaginal bleeding between periods. Summary  Bacterial vaginosis is a vaginal infection that occurs when the normal balance of bacteria in the vagina is disrupted.  Because bacterial vaginosis increases your risk for STIs (sexually transmitted infections), getting treated can help reduce your risk for chlamydia, gonorrhea, herpes, and HIV (human immunodeficiency virus). Treatment is also important for preventing complications in pregnant women, because the condition can cause an early (premature) delivery.  This condition is treated with antibiotic medicines. These may be given as a pill, a vaginal cream, or a medicine that is put into the vagina (suppository). This information is not intended to replace advice given to you by your health care provider. Make sure you discuss any questions you have with your health care provider. Document Released: 02/17/2005 Document Revised: 11/03/2015 Document Reviewed: 11/03/2015 Elsevier Interactive Patient Education  2017 [employer]

## 2016-02-15 NOTE — MAU Provider Note (Signed)
History     CSN: [id number]  Arrival date and time: 02/14/16 2317   First Provider Initiated Contact with Patient 02/15/16 0005      Chief Complaint  Patient presents with  . Abdominal Pain   [name] is a 35 y.o. [id number] who presents today with abdominal pain.    Abdominal Pain  This is a new problem. The current episode started today (few hours PTA. ). The onset quality is sudden. The problem occurs constantly. The problem has been unchanged. The pain is located in the suprapubic region ("If I had a uterus that is where the pain would be"). The pain is at a severity of 8/10. The quality of the pain is sharp and tearing. The abdominal pain radiates to the RLQ and LLQ. Associated symptoms include dysuria (some pain today that did not seem like the normal pain I have been having. ) and nausea. Pertinent negatives include no constipation, diarrhea, fever, frequency or vomiting. The pain is aggravated by movement and urination. The pain is relieved by nothing. Treatments tried: ibuprofen, vicodin.  The treatment provided no relief. Her past medical history is significant for abdominal surgery.   Past Medical History:  Diagnosis Date  . ADHD (attention deficit hyperactivity disorder)   . ADHD, adult residual type   . Anxiety   . Depression   . Family history of adverse reaction to anesthesia    sister has PONV  . GERD (gastroesophageal reflux disease)   . Headache    otc med prn - last one 2 months ago    Past Surgical History:  Procedure Laterality Date  . ABDOMINAL HYSTERECTOMY    . CESAREAN SECTION     x 2  . CYSTOSCOPY N/A 06/19/2015   Procedure: CYSTOSCOPY;  Surgeon: Jaymes GraffNaima Dillard, MD;  Location: [facility];  Service: Gynecology;  Laterality: N/A;  . LAPAROSCOPIC VAGINAL HYSTERECTOMY WITH SALPINGECTOMY Bilateral 06/19/2015   Procedure: LAPAROSCOPIC ASSISTED VAGINAL HYSTERECTOMY WITH SALPINGECTOMY;  Surgeon: Jaymes GraffNaima Dillard, MD;  Location: [facility];  Service: Gynecology;   Laterality: Bilateral;  . TONSILLECTOMY      History reviewed. No pertinent family history.  Social History  Substance Use Topics  . Smoking status: Never Smoker  . Smokeless tobacco: Never Used  . Alcohol use Yes     Comment: occasional liquor    Allergies:  Allergies  Allergen Reactions  . Asa [Aspirin]     pain  . Macrobid [employer][Nitrofurantoin Monohyd Macro] Other (See Comments)    Stomach pain  . Naproxen Nausea And Vomiting and Other (See Comments)    cramping  . Penicillins Hives    Has patient had a PCN reaction causing immediate rash, facial/tongue/throat swelling, SOB or lightheadedness with hypotension: No Has patient had a PCN reaction causing severe rash involving mucus membranes or skin necrosis: No Has patient had a PCN reaction that required hospitalization No Has patient had a PCN reaction occurring within the last 10 years: No If all of the above answers are "NO", then may proceed with Cephalosporin use.  Lyman Bishop. Sudafed [Pseudoephedrine Hcl] Hives    Prescriptions Prior to Admission  Medication Sig Dispense Refill Last Dose  . ALPRAZolam (XANAX) 0.5 MG tablet Take 0.25-0.5 tablets by mouth 2 (two) times daily as needed.   06/19/2015 at 0700  . amphetamine-dextroamphetamine (ADDERALL XR) 15 MG 24 hr capsule Take 15 mg by mouth every morning.   Past Month at Unknown time  . citalopram (CELEXA) 20 MG tablet Take 20 mg by mouth daily.  06/19/2015 at 0700  . cyclobenzaprine (FLEXERIL) 10 MG tablet Take 1 tablet (10 mg total) by mouth 2 (two) times daily as needed for muscle spasms. (Patient not taking: Reported on 09/15/2014) 20 tablet 0   . cyclobenzaprine (FLEXERIL) 5 MG tablet Take 1 tablet (5 mg total) by mouth 3 (three) times daily as needed for muscle spasms (severe back pain). 30 tablet 0   . fluconazole (DIFLUCAN) 200 MG tablet Take 1 tablet (200 mg total) by mouth daily. For 7 days 7 tablet 0   . ibuprofen (ADVIL,MOTRIN) 600 MG tablet 1 po  pc every 6 hours for 5 days  then as needed for pain 30 tablet 1   . magic mouthwash w/lidocaine SOLN Take 5 mLs by mouth 3 (three) times daily as needed for mouth pain. 240 mL 0   . methocarbamol (ROBAXIN) 500 MG tablet Take 1 tablet (500 mg total) by mouth 2 (two) times daily. 20 tablet 0   . oxyCODONE-acetaminophen (PERCOCET/ROXICET) 5-325 MG tablet Take 1-2 tablets by mouth every 4 (four) hours as needed for severe pain (moderate to severe pain (when tolerating fluids)). 30 tablet 0   . pantoprazole (PROTONIX) 40 MG tablet Take 1 tablet by mouth 2 (two) times daily.    06/19/2015 at 0700    Review of Systems  Constitutional: Negative for chills and fever.  Gastrointestinal: Positive for abdominal pain and nausea. Negative for constipation, diarrhea and vomiting.  Genitourinary: Positive for dysuria (some pain today that did not seem like the normal pain I have been having. ). Negative for frequency and urgency.   Physical Exam   Blood pressure 150/92, pulse 113, temperature 97.8 F (36.6 C), temperature source Oral, resp. rate 20, height 5\' 4"  (1.626 m), weight 256 lb 6 oz (116.3 kg), last menstrual period 06/12/2015, SpO2 98 %.  Physical Exam  Nursing note and vitals reviewed. Constitutional: She is oriented to person, place, and time. She appears well-developed and well-nourished. No distress.  HENT:  Head: Normocephalic.  Cardiovascular: Normal rate.   Respiratory: Effort normal.  GI: Soft. There is no tenderness. There is no rebound.  Genitourinary:  Genitourinary Comments:  External: no lesion Vagina: large amount of white discharge Cervix: SA, but tenderness at the cuff  Uterus: SA Adnexa: NT   Neurological: She is alert and oriented to person, place, and time.  Skin: Skin is warm and dry.  Psychiatric: She has a normal mood and affect.   Results for orders placed or performed during the hospital encounter of 02/14/16 (from the past 24 hour(s))  Urinalysis, Routine w reflex microscopic     Status:  Abnormal   Collection Time: 02/14/16 11:29 PM  Result Value Ref Range   Color, Urine YELLOW YELLOW   APPearance HAZY (A) CLEAR   Specific Gravity, Urine 1.021 1.005 - 1.030   pH 6.0 5.0 - 8.0   Glucose, UA NEGATIVE NEGATIVE mg/dL   Hgb urine dipstick NEGATIVE NEGATIVE   Bilirubin Urine NEGATIVE NEGATIVE   Ketones, ur NEGATIVE NEGATIVE mg/dL   Protein, ur NEGATIVE NEGATIVE mg/dL   Nitrite NEGATIVE NEGATIVE   Leukocytes, UA NEGATIVE NEGATIVE  Wet prep, genital     Status: Abnormal   Collection Time: 02/15/16 12:18 AM  Result Value Ref Range   Yeast Wet Prep HPF POC NONE SEEN NONE SEEN   Trich, Wet Prep NONE SEEN NONE SEEN   Clue Cells Wet Prep HPF POC PRESENT (A) NONE SEEN   WBC, Wet Prep HPF POC FEW (A) NONE  SEEN   Sperm NONE SEEN   CBC     Status: Abnormal   Collection Time: 02/15/16 12:21 AM  Result Value Ref Range   WBC 9.9 4.0 - 10.5 K/uL   RBC 3.95 3.87 - 5.11 MIL/uL   Hemoglobin 11.6 (L) 12.0 - 15.0 g/dL   HCT 16.134.8 (L) 09.636.0 - 04.546.0 %   MCV 88.1 78.0 - 100.0 fL   MCH 29.4 26.0 - 34.0 pg   MCHC 33.3 30.0 - 36.0 g/dL   RDW 40.914.5 81.111.5 - 91.415.5 %   Platelets 360 150 - 400 K/uL   [place] Transvaginal Non-ob  Result Date: 02/15/2016 CLINICAL DATA:  35 year old female with lower abdominal pain. History of prior hysterectomy. EXAM: TRANSABDOMINAL AND TRANSVAGINAL ULTRASOUND OF PELVIS TECHNIQUE: Both transabdominal and transvaginal ultrasound examinations of the pelvis were performed. Transabdominal technique was performed for global imaging of the pelvis including uterus, ovaries, adnexal regions, and pelvic cul-de-sac. It was necessary to proceed with endovaginal exam following the transabdominal exam to visualize the ovaries. COMPARISON:  Abdominal CT dated 11/18/2015 FINDINGS: Uterus Hysterectomy. Endometrium Hysterectomy. Right ovary Measurements: 3.7 x 2.9 x 2.9 cm. There is a 1.9 x 2.4 x 2.7 cm cyst in the right ovary. Left ovary Measurements: Not visualized. Other findings No abnormal  free fluid. IMPRESSION: Small right ovarian cyst. Nonvisualization of the left ovary. Hysterectomy. Electronically Signed   By: Elgie CollardArash  Radparvar M.D.   On: 02/15/2016 02:08   [place] Pelvis Complete  Result Date: 02/15/2016 CLINICAL DATA:  35 year old female with lower abdominal pain. History of prior hysterectomy. EXAM: TRANSABDOMINAL AND TRANSVAGINAL ULTRASOUND OF PELVIS TECHNIQUE: Both transabdominal and transvaginal ultrasound examinations of the pelvis were performed. Transabdominal technique was performed for global imaging of the pelvis including uterus, ovaries, adnexal regions, and pelvic cul-de-sac. It was necessary to proceed with endovaginal exam following the transabdominal exam to visualize the ovaries. COMPARISON:  Abdominal CT dated 11/18/2015 FINDINGS: Uterus Hysterectomy. Endometrium Hysterectomy. Right ovary Measurements: 3.7 x 2.9 x 2.9 cm. There is a 1.9 x 2.4 x 2.7 cm cyst in the right ovary. Left ovary Measurements: Not visualized. Other findings No abnormal free fluid. IMPRESSION: Small right ovarian cyst. Nonvisualization of the left ovary. Hysterectomy. Electronically Signed   By: Elgie CollardArash  Radparvar M.D.   On: 02/15/2016 02:08     MAU Course  Procedures  MDM 0109: D/W Dr. Estanislado Pandyivard: if pain is better with Toradol, then she can be dc home with oral Toradol. If no improvement then will get US tonight. Treat BV when DC home.  Patient reports that toradol helped, but the pain is returning. Will send for US.   Assessment and Plan   1. Cyst of right ovary   2. Pelvic pain   3. Bacterial vaginosis    DC home Comfort measures reviewed  RX: toradol 10mg  q 6 hours PRN #20, flagyl 500mg  BID x 7 days  Return to MAU as needed FU with GYN as planned  Follow-up Information    DILLARD,NAIMA A, MD Follow up.   Specialty:  Obstetrics and Gynecology Contact information: [address] [place] [address] [phone]            Tawnya CrookHogan, Heather Donovan 02/15/2016,  2:12 AM

## 2016-02-16 LAB — URINE CULTURE: CULTURE: NO GROWTH

## 2016-02-18 LAB — GC/CHLAMYDIA PROBE AMP (~~LOC~~) NOT AT ARMC
Chlamydia: NEGATIVE
Neisseria Gonorrhea: NEGATIVE

## 2016-04-15 DIAGNOSIS — F902 Attention-deficit hyperactivity disorder, combined type: Secondary | ICD-10-CM | POA: Insufficient documentation

## 2016-04-15 DIAGNOSIS — F411 Generalized anxiety disorder: Secondary | ICD-10-CM | POA: Insufficient documentation

## 2016-04-15 DIAGNOSIS — Z90711 Acquired absence of uterus with remaining cervical stump: Secondary | ICD-10-CM | POA: Insufficient documentation

## 2016-05-06 ENCOUNTER — Telehealth: Payer: Self-pay | Admitting: Neurology

## 2016-05-06 ENCOUNTER — Encounter: Payer: Self-pay | Admitting: Neurology

## 2016-05-06 ENCOUNTER — Encounter (INDEPENDENT_AMBULATORY_CARE_PROVIDER_SITE_OTHER): Payer: Self-pay

## 2016-05-06 ENCOUNTER — Ambulatory Visit (INDEPENDENT_AMBULATORY_CARE_PROVIDER_SITE_OTHER): Payer: BLUE CROSS/BLUE SHIELD | Admitting: Neurology

## 2016-05-06 DIAGNOSIS — IMO0001 Reserved for inherently not codable concepts without codable children: Secondary | ICD-10-CM

## 2016-05-06 DIAGNOSIS — G43709 Chronic migraine without aura, not intractable, without status migrainosus: Secondary | ICD-10-CM

## 2016-05-06 DIAGNOSIS — Z6841 Body Mass Index (BMI) 40.0 and over, adult: Secondary | ICD-10-CM | POA: Diagnosis not present

## 2016-05-06 DIAGNOSIS — IMO0002 Reserved for concepts with insufficient information to code with codable children: Secondary | ICD-10-CM | POA: Insufficient documentation

## 2016-05-06 DIAGNOSIS — E669 Obesity, unspecified: Secondary | ICD-10-CM | POA: Diagnosis not present

## 2016-05-06 DIAGNOSIS — G932 Benign intracranial hypertension: Secondary | ICD-10-CM

## 2016-05-06 MED ORDER — ONDANSETRON 4 MG PO TBDP: 4.0000 mg | ORAL_TABLET | Freq: Three times a day (TID) | ORAL | 11 refills | Status: DC | PRN

## 2016-05-06 MED ORDER — TOPIRAMATE 100 MG PO TABS: 100.0000 mg | ORAL_TABLET | Freq: Two times a day (BID) | ORAL | 11 refills | Status: DC

## 2016-05-06 MED ORDER — RIZATRIPTAN BENZOATE 10 MG PO TBDP: 10.0000 mg | ORAL_TABLET | ORAL | 6 refills | Status: DC | PRN

## 2016-05-06 NOTE — Telephone Encounter (Signed)
The patient was seen earlier today by Dr. Terrace ArabiaYan for pseudotumor cerebri.  She had a severe headache this afternoon/evening and took Maxalts with Zofran with mild benefit x 1 hour and took a second Maxalt 2 hours later but headache persists.  I offered to call in indomethacin.  She would prefer to have a Migraine cocktail and may go to ER.  I advised her to call tomorrow if HA not better

## 2016-05-06 NOTE — Progress Notes (Signed)
PATIENT: [name] DOB: Feb 26, 1981  Chief Complaint  Patient presents with  . Headache    She is here with her husband, [name].  She has started having frequent headaches again.  She has been seen here previously for pseudotumor cerebri.  Marland Kitchen. PCP    Arvilla MeresKelly Phillips, PA-C     HISTORICAL  [name] is a 36 years old right-handed female, accompanied by her husband [name], seen in refer by her primary care physician nurse practitioner Arvilla MeresKelly Phillips for evaluation of pseudotumor cerebri, initial evaluation was on May 06 2016.  I saw her in 2011, She had a history of hypertension during pregnancy, postpartum depression, frequent headaches postpartum day, there was mild evidence of papillary edema on examination, she was referred for CT head without contrast that was normal in December 2011, lumbar puncture on February 06 2010, patient reported elevated open pressure 44 cm water, she later developed difficulty urination, low back pain, had MRI of lumbar on February 08 2010, that was normal.  I reviewed and summarized the referring note, reported a history of ADHD since age 89, only tolerate Adderall extended release, anxiety, currently taking Wellbutrin 300 mg daily, recently started on BuSpar 15 mg twice a day, Klonopin 1 mg twice a day as needed, also taking instant release Adderall 5 mg one tablet in the afternoon  He lost insurance has lost follow-up since 2011, today she came in complains of gradual worsening headaches since 2016, now she has daily left occipital headache, spreading forward to become left retro-orbital area severe pounding headache with associated light noise sensitivity, nauseous, she went to emergency room about 6 times over the past 12 months for headache treatment, she also had steady weight gain, complains of transient difficulty focusing with sudden body positional change. She denies tinnitus, no hearing loss.   REVIEW OF SYSTEMS: Full 14 system review  of systems performed and notable only for as above  ALLERGIES: Allergies  Allergen Reactions  . Asa [Aspirin]     pain  . Macrobid [employer][Nitrofurantoin Monohyd Macro] Other (See Comments)    Stomach pain  . Naproxen Nausea And Vomiting and Other (See Comments)    cramping  . Penicillins Hives    Has patient had a PCN reaction causing immediate rash, facial/tongue/throat swelling, SOB or lightheadedness with hypotension: No Has patient had a PCN reaction causing severe rash involving mucus membranes or skin necrosis: No Has patient had a PCN reaction that required hospitalization No Has patient had a PCN reaction occurring within the last 10 years: No If all of the above answers are "NO", then may proceed with Cephalosporin use.  Lyman Bishop. Sudafed [Pseudoephedrine Hcl] Hives    HOME MEDICATIONS: Current Outpatient Prescriptions  Medication Sig Dispense Refill  . amphetamine-dextroamphetamine (ADDERALL XR) 15 MG 24 hr capsule Take 15 mg by mouth every morning.    Marland Kitchen. amphetamine-dextroamphetamine (ADDERALL) 5 MG tablet Take 5 mg by mouth daily.  0  . buPROPion (WELLBUTRIN XL) 300 MG 24 hr tablet Take 300 mg by mouth daily.  4  . busPIRone (BUSPAR) 15 MG tablet Take 15 mg by mouth 2 (two) times daily.    . clonazePAM (KLONOPIN) 1 MG tablet Take 1 mg by mouth 2 (two) times daily as needed for anxiety.     Marland Kitchen. ibuprofen (ADVIL,MOTRIN) 600 MG tablet 1 po  pc every 6 hours for 5 days then as needed for pain 30 tablet 1   No current facility-administered medications for this visit.  PAST MEDICAL HISTORY: Past Medical History:  Diagnosis Date  . ADHD (attention deficit hyperactivity disorder)   . ADHD, adult residual type   . Anxiety   . Depression   . Family history of adverse reaction to anesthesia    sister has PONV  . GERD (gastroesophageal reflux disease)   . Headache    otc med prn - last one 2 months ago  . Pseudotumor cerebri     PAST SURGICAL HISTORY: Past Surgical History:    Procedure Laterality Date  . ABDOMINAL HYSTERECTOMY    . CESAREAN SECTION     x 2  . CYSTOSCOPY N/A 06/19/2015   Procedure: CYSTOSCOPY;  Surgeon: Jaymes Graff, MD;  Location: [facility];  Service: Gynecology;  Laterality: N/A;  . LAPAROSCOPIC VAGINAL HYSTERECTOMY WITH SALPINGECTOMY Bilateral 06/19/2015   Procedure: LAPAROSCOPIC ASSISTED VAGINAL HYSTERECTOMY WITH SALPINGECTOMY;  Surgeon: Jaymes Graff, MD;  Location: [facility];  Service: Gynecology;  Laterality: Bilateral;  . TONSILLECTOMY      FAMILY HISTORY: Family History  Problem Relation Age of Onset  . Pneumonia Mother     Sepsis  . Heart attack Father     SOCIAL HISTORY:  Social History   Social History  . Marital status: Married    Spouse name: N/A  . Number of children: 2  . Years of education: cosmetology   Occupational History  . Homemaker    Social History Main Topics  . Smoking status: Never Smoker  . Smokeless tobacco: Never Used  . Alcohol use Yes     Comment: occasional liquor  . Drug use: No  . Sexual activity: Yes    Birth control/ protection: None     Comment: Husband had vasectomy   Other Topics Concern  . Not on file   Social History Narrative   Lives at home with her husband and children.   Right-handed.   3-4 cups caffeine per day.     PHYSICAL EXAM   Vitals:   05/06/16 1033  BP: (!) 134/99  Pulse: (!) 115  Weight: 260 lb (117.9 kg)  Height: 5\' 4"  (1.626 m)    Not recorded      Body mass index is 44.63 kg/m.  PHYSICAL EXAMNIATION:  Gen: NAD, conversant, well nourised, obese, well groomed                     Cardiovascular: Regular rate rhythm, no peripheral edema, warm, nontender. Eyes: Conjunctivae clear without exudates or hemorrhage Neck: Supple, no carotid bruits. Pulmonary: Clear to auscultation bilaterally   NEUROLOGICAL EXAM:  MENTAL STATUS: Speech:    Speech is normal; fluent and spontaneous with normal comprehension.  Cognition:     Orientation to time, place  and person     Normal recent and remote memory     Normal Attention span and concentration     Normal Language, naming, repeating,spontaneous speech     Fund of knowledge   CRANIAL NERVES: CN II: Visual fields are full to confrontation. Fundoscopic exam Showed mild reactive at bilateral fundi, I was not able to appreciate venous pulsations. Pupils are round equal and briskly reactive to light. CN III, IV, VI: extraocular movement are normal. No ptosis. CN V: Facial sensation is intact to pinprick in all 3 divisions bilaterally. Corneal responses are intact.  CN VII: Face is symmetric with normal eye closure and smile. CN VIII: Hearing is normal to rubbing fingers CN IX, X: Palate elevates symmetrically. Phonation is normal. CN XI: Head turning and  shoulder shrug are intact CN XII: Tongue is midline with normal movements and no atrophy.  MOTOR: There is no pronator drift of out-stretched arms. Muscle bulk and tone are normal. Muscle strength is normal.  REFLEXES: Reflexes are 2+ and symmetric at the biceps, triceps, knees, and ankles. Plantar responses are flexor.  SENSORY: Intact to light touch, pinprick, positional sensation and vibratory sensation are intact in fingers and toes.  COORDINATION: Rapid alternating movements and fine finger movements are intact. There is no dysmetria on finger-to-nose and heel-knee-shin.    GAIT/STANCE: Posture is normal. Gait is steady with normal steps, base, arm swing, and turning. Heel and toe walking are normal. Tandem gait is normal.  Romberg is absent.   DIAGNOSTIC DATA (LABS, IMAGING, TESTING) - I reviewed patient records, labs, notes, testing and imaging myself where available.   ASSESSMENT AND PLAN  CURTISHA BENDIX is a 36 y.o. female   Pseudotumor cerebri Frequent migraine headaches  MRI of the brain without contrast  If the MRI of the brain is normal will refer her for fluoroscopy guided lumbar puncture,  Topamax 100 mg twice  a day as preventive medication  Maxalt, Zofran as needed     Levert Feinstein, M.D. Ph.D.  [facility] [address], [address] [place], [place] [address] Ph: [phone] Fax: [phone]  CC: [name]

## 2016-05-07 ENCOUNTER — Telehealth: Payer: Self-pay | Admitting: Neurology

## 2016-05-07 ENCOUNTER — Ambulatory Visit
Admission: RE | Admit: 2016-05-07 | Discharge: 2016-05-07 | Disposition: A | Discharge status: Home / Self Care | Payer: BLUE CROSS/BLUE SHIELD | Source: Ambulatory Visit | Attending: Neurology | Admitting: Neurology

## 2016-05-07 ENCOUNTER — Other Ambulatory Visit: Payer: BLUE CROSS/BLUE SHIELD

## 2016-05-07 ENCOUNTER — Other Ambulatory Visit: Payer: Self-pay

## 2016-05-07 ENCOUNTER — Ambulatory Visit: Payer: Self-pay | Admitting: Neurology

## 2016-05-07 DIAGNOSIS — G43809 Other migraine, not intractable, without status migrainosus: Secondary | ICD-10-CM

## 2016-05-07 DIAGNOSIS — G932 Benign intracranial hypertension: Secondary | ICD-10-CM

## 2016-05-07 MED ORDER — HYDROMORPHONE HCL 2 MG/ML IJ SOLN
2.0000 mg | Freq: Once | INTRAMUSCULAR | Status: AC
  Administered 2016-05-07: 2 mg via INTRAMUSCULAR

## 2016-05-07 MED ORDER — ONDANSETRON HCL 4 MG/2ML IJ SOLN
4.0000 mg | Freq: Once | INTRAMUSCULAR | Status: AC
  Administered 2016-05-07: 4 mg via INTRAMUSCULAR

## 2016-05-07 NOTE — Telephone Encounter (Signed)
Patient calling with a severe headache. She spoke to Dr. Epimenio FootSater last night and he offered to call in something but she decided against it and went to [facility] in [place]. There they gave her Zofrin, Benadryl and Torodol by IV, injection of Decadron and something for nausea which has not helped. Please call and discuss.

## 2016-05-07 NOTE — Telephone Encounter (Signed)
Spoke to patient - she is feeling better now and rates pain at 6 on pain scale (says it has been coming down steadily).  She was prescribed Valium 5mg , #5 tablet by the radiologist and will use it to help her rest, in effort to rid the last of her pain.  She will call back, if headache does not resolve.  She is aware of her opening pressure.

## 2016-05-07 NOTE — Telephone Encounter (Signed)
LP open pressure was only 23 cm H2O, please call and check on her headaches, if she has persistent migraine like headache, may come in to office earlier

## 2016-05-07 NOTE — Addendum Note (Signed)
Addended by: Lindell SparKIRKMAN, Chastelyn Athens C on: 05/07/2016 10:44 AM   Modules accepted: Orders

## 2016-05-07 NOTE — Telephone Encounter (Signed)
Spoke to Sum[facility]ennifer at [facility] and they will work patient in today.  Pt agreeable and will have her husband drive her to the appt.  She is aware that she will need 24-hr bedrest after her LP.

## 2016-05-07 NOTE — Telephone Encounter (Signed)
She is approved for the MRI Brain wo contrast.. Let me know whenever you would like that to be scheduled..Marland Kitchen

## 2016-05-07 NOTE — Telephone Encounter (Signed)
She had severe headache since March 6th 2018, took Maxalt 10mg  twice yesterday, then went to ER, CT head was normal, 9/10 headache, was given cocktail, went to sleep at 1am, headache came back after she wokeup, she has to take Maxalt at 830am,   She still has 8/10, left> right severe retrorbital region severe headaches.  Will try LP today, her headache has much improved after LP in the past.

## 2016-05-07 NOTE — Discharge Instructions (Signed)

## 2016-05-07 NOTE — Telephone Encounter (Signed)
Per Dr. Terrace ArabiaYan - work-in for nerve block.  Pt agreeable to this plan and has been placed on the schedule.  Dr. Terrace ArabiaYan would like her MRI scheduled as soon as possible.

## 2016-05-08 ENCOUNTER — Ambulatory Visit (INDEPENDENT_AMBULATORY_CARE_PROVIDER_SITE_OTHER): Payer: BLUE CROSS/BLUE SHIELD | Admitting: Neurology

## 2016-05-08 VITALS — BP 133/84 | HR 122 | Ht 64.0 in | Wt 260.0 lb

## 2016-05-08 DIAGNOSIS — G43711 Chronic migraine without aura, intractable, with status migrainosus: Secondary | ICD-10-CM | POA: Diagnosis not present

## 2016-05-08 MED ORDER — BUTALBITAL-APAP-CAFFEINE 50-325-40 MG PO TABS: 1.0000 | ORAL_TABLET | Freq: Four times a day (QID) | ORAL | 0 refills | Status: DC | PRN

## 2016-05-08 NOTE — Telephone Encounter (Signed)
Patient called office in reference to still having a nagging headache since lumbar puncture yesterday rated at 4-5.  Patient still has ice pack on head for relief.  Please call  °

## 2016-05-08 NOTE — Telephone Encounter (Signed)
Noted, thank you

## 2016-05-08 NOTE — Addendum Note (Signed)
Addended by: Levert FeinsteinYAN, Abaigeal Moomaw on: 05/08/2016 09:25 AM   Modules accepted: Orders

## 2016-05-08 NOTE — Telephone Encounter (Signed)
Patient was worked into Dr. Yan's schedule today for a nerve block. 

## 2016-05-08 NOTE — Telephone Encounter (Signed)
Patient was worked into Dr. Zannie CoveYan's schedule today for a nerve block.

## 2016-05-08 NOTE — Telephone Encounter (Signed)
Patient called office in reference to still having a nagging headache since lumbar puncture yesterday rated at 4-5.  Patient still has ice pack on head for relief.  Please call

## 2016-05-08 NOTE — Progress Notes (Signed)
**  Bupivacaine 50mg /7110ml, NDC [id number], Lot [id number], Exp 08/2017, office supply.//mck,rn**

## 2016-05-09 ENCOUNTER — Telehealth: Payer: Self-pay | Admitting: Neurology

## 2016-05-09 ENCOUNTER — Encounter: Payer: Self-pay | Admitting: Neurology

## 2016-05-09 ENCOUNTER — Encounter (HOSPITAL_COMMUNITY): Payer: Self-pay | Admitting: *Deleted

## 2016-05-09 DIAGNOSIS — Z79899 Other long term (current) drug therapy: Secondary | ICD-10-CM

## 2016-05-09 DIAGNOSIS — G43709 Chronic migraine without aura, not intractable, without status migrainosus: Principal | ICD-10-CM | POA: Diagnosis present

## 2016-05-09 DIAGNOSIS — F419 Anxiety disorder, unspecified: Secondary | ICD-10-CM | POA: Diagnosis present

## 2016-05-09 DIAGNOSIS — G43711 Chronic migraine without aura, intractable, with status migrainosus: Secondary | ICD-10-CM | POA: Insufficient documentation

## 2016-05-09 DIAGNOSIS — Z90710 Acquired absence of both cervix and uterus: Secondary | ICD-10-CM

## 2016-05-09 DIAGNOSIS — G971 Other reaction to spinal and lumbar puncture: Secondary | ICD-10-CM | POA: Diagnosis not present

## 2016-05-09 DIAGNOSIS — Z6841 Body Mass Index (BMI) 40.0 and over, adult: Secondary | ICD-10-CM

## 2016-05-09 DIAGNOSIS — Z888 Allergy status to other drugs, medicaments and biological substances status: Secondary | ICD-10-CM

## 2016-05-09 DIAGNOSIS — Y844 Aspiration of fluid as the cause of abnormal reaction of the patient, or of later complication, without mention of misadventure at the time of the procedure: Secondary | ICD-10-CM | POA: Diagnosis present

## 2016-05-09 DIAGNOSIS — K219 Gastro-esophageal reflux disease without esophagitis: Secondary | ICD-10-CM | POA: Diagnosis present

## 2016-05-09 DIAGNOSIS — Z8249 Family history of ischemic heart disease and other diseases of the circulatory system: Secondary | ICD-10-CM

## 2016-05-09 DIAGNOSIS — E669 Obesity, unspecified: Secondary | ICD-10-CM | POA: Diagnosis present

## 2016-05-09 DIAGNOSIS — Z880 Allergy status to penicillin: Secondary | ICD-10-CM

## 2016-05-09 DIAGNOSIS — F909 Attention-deficit hyperactivity disorder, unspecified type: Secondary | ICD-10-CM | POA: Diagnosis present

## 2016-05-09 DIAGNOSIS — G932 Benign intracranial hypertension: Secondary | ICD-10-CM | POA: Diagnosis present

## 2016-05-09 MED ORDER — BUPIVACAINE HCL (PF) 0.5 % IJ SOLN
10.0000 mL | Freq: Once | INTRAMUSCULAR | Status: AC
  Administered 2016-05-09: 10 mL

## 2016-05-09 NOTE — Progress Notes (Signed)
   History: Patient with history of chronic migraine headache, lumbar puncture on May 07 2016 showed open pressure 23, only provide temporary relief of her headaches, now presented with moderate to severe bilateral temporal vortex area pressure headaches. with light noise sensitivity.   Bilateral occipital and trigeminal nerve block; trigger point injection of bilateral cervical and upper trapezius muscles for intractable headache  Bupivacaine 0.5% was injected on the scalp bilaterally at several locations:  -On the occipital area of the head, 3 injections each side, 0.5 cc per injection at the midpoint between the mastoid process and the occipital protuberance. 2 other injections were done one finger breadth from the initial injection, one at a 10 o'clock position and the other at a 2 o'clock position.  -2 injections of 0.5 cc were done in the temporal regions, 2 fingerbreadths above the tragus of the ear, with the second injection one fingerbreadth posteriorly to the first.  -2 injections were done on the brow, 1 in the medial brow and one over the supraorbital nerve notch, with 0.1 cc for each injection  -1 injection each side of 0.5 cc was done anterior to the tragus of the ear for a trigeminal ganglion injection  -0.5 cc was injected into bilateral upper trapezius and bilateral upper cervical paraspinals   The patient tolerated the injections well, no complications of the procedure were noted. Injections were made with a 27-gauge needle.

## 2016-05-09 NOTE — Telephone Encounter (Signed)
[name]  Called Friday evening --- having a positional headache after LP Wednesday.   [facility] does not do blood patches over weekend  I advised supine bedrest, encouraged increased po liquids, caffeine and called in Tylenol #3 one to two po q6 hours  #20  Please call patient on Monday as Epidural blood patch may be needed

## 2016-05-09 NOTE — ED Triage Notes (Signed)
The pt is c/o a migraine since Tuesday when she saw her neurologist   Hx pseudotumor  She had a headache cocktail on Tuesday.  Wednesday had a nerve block and a lp.  Since the lp she has a severe headache that is worse when she stands.  She has talked to her doctor and he sent her here for a blood patch  meds prescribed  Tylenol with codeine  Not helping.  lmp none

## 2016-05-09 NOTE — Progress Notes (Signed)
error 

## 2016-05-10 ENCOUNTER — Inpatient Hospital Stay (HOSPITAL_COMMUNITY)
Admission: EM | Admit: 2016-05-10 | Discharge: 2016-05-13 | DRG: 103 | Disposition: A | Discharge status: Home / Self Care | Payer: BLUE CROSS/BLUE SHIELD | Attending: Internal Medicine | Admitting: Internal Medicine

## 2016-05-10 ENCOUNTER — Encounter (HOSPITAL_COMMUNITY): Payer: Self-pay | Admitting: Internal Medicine

## 2016-05-10 DIAGNOSIS — M25422 Effusion, left elbow: Secondary | ICD-10-CM

## 2016-05-10 DIAGNOSIS — R51 Headache: Secondary | ICD-10-CM

## 2016-05-10 DIAGNOSIS — G43009 Migraine without aura, not intractable, without status migrainosus: Secondary | ICD-10-CM | POA: Diagnosis not present

## 2016-05-10 DIAGNOSIS — F411 Generalized anxiety disorder: Secondary | ICD-10-CM | POA: Diagnosis not present

## 2016-05-10 DIAGNOSIS — G932 Benign intracranial hypertension: Secondary | ICD-10-CM

## 2016-05-10 DIAGNOSIS — E669 Obesity, unspecified: Secondary | ICD-10-CM

## 2016-05-10 DIAGNOSIS — M25522 Pain in left elbow: Secondary | ICD-10-CM

## 2016-05-10 DIAGNOSIS — Z79899 Other long term (current) drug therapy: Secondary | ICD-10-CM | POA: Diagnosis not present

## 2016-05-10 DIAGNOSIS — G971 Other reaction to spinal and lumbar puncture: Secondary | ICD-10-CM | POA: Diagnosis not present

## 2016-05-10 DIAGNOSIS — R519 Headache, unspecified: Secondary | ICD-10-CM

## 2016-05-10 DIAGNOSIS — F909 Attention-deficit hyperactivity disorder, unspecified type: Secondary | ICD-10-CM | POA: Diagnosis not present

## 2016-05-10 HISTORY — DX: Other reaction to spinal and lumbar puncture: G97.1

## 2016-05-10 LAB — PROTIME-INR
INR: 1.04
Prothrombin Time: 13.6 seconds (ref 11.4–15.2)

## 2016-05-10 LAB — BASIC METABOLIC PANEL
Anion gap: 7 (ref 5–15)
BUN: 17 mg/dL (ref 6–20)
CHLORIDE: 111 mmol/L (ref 101–111)
CO2: 19 mmol/L — AB (ref 22–32)
Calcium: 8.9 mg/dL (ref 8.9–10.3)
Creatinine, Ser: 0.97 mg/dL (ref 0.44–1.00)
GFR calc non Af Amer: >60 mL/min (ref >60)
Glucose, Bld: 83 mg/dL (ref 65–99)
Potassium: 3.9 mmol/L (ref 3.5–5.1)
Sodium: 137 mmol/L (ref 135–145)

## 2016-05-10 LAB — CBC
HCT: 38.2 % (ref 36.0–46.0)
Hemoglobin: 12.6 g/dL (ref 12.0–15.0)
MCH: 29.7 pg (ref 26.0–34.0)
MCHC: 33 g/dL (ref 30.0–36.0)
MCV: 90.1 fL (ref 78.0–100.0)
Platelets: 321 10*3/uL (ref 150–400)
RBC: 4.24 MIL/uL (ref 3.87–5.11)
RDW: 15 % (ref 11.5–15.5)
WBC: 10.5 10*3/uL (ref 4.0–10.5)

## 2016-05-10 MED ORDER — DIPHENHYDRAMINE HCL 25 MG PO CAPS
25.0000 mg | ORAL_CAPSULE | Freq: Four times a day (QID) | ORAL | Status: DC | PRN
Start: 2016-05-10 — End: 2016-05-13
  Administered 2016-05-11 – 2016-05-13 (×5): 25 mg via ORAL
  Filled 2016-05-10 (×5): qty 1

## 2016-05-10 MED ORDER — PROMETHAZINE HCL 25 MG PO TABS
12.5000 mg | ORAL_TABLET | Freq: Three times a day (TID) | ORAL | Status: DC | PRN
  Administered 2016-05-10 – 2016-05-12 (×3): 12.5 mg via ORAL
  Filled 2016-05-10 (×3): qty 1

## 2016-05-10 MED ORDER — LORAZEPAM 2 MG/ML IJ SOLN
1.0000 mg | Freq: Once | INTRAMUSCULAR | Status: AC
  Administered 2016-05-10: 1 mg via INTRAVENOUS
  Filled 2016-05-10: qty 1

## 2016-05-10 MED ORDER — SODIUM CHLORIDE 0.9 % IV BOLUS (SEPSIS)
1000.0000 mL | Freq: Once | INTRAVENOUS | Status: AC
  Administered 2016-05-10: 1000 mL via INTRAVENOUS

## 2016-05-10 MED ORDER — PROCHLORPERAZINE EDISYLATE 5 MG/ML IJ SOLN
10.0000 mg | Freq: Once | INTRAMUSCULAR | Status: AC
  Administered 2016-05-10: 10 mg via INTRAVENOUS
  Filled 2016-05-10: qty 2

## 2016-05-10 MED ORDER — DIPHENHYDRAMINE HCL 50 MG/ML IJ SOLN
25.0000 mg | Freq: Once | INTRAMUSCULAR | Status: AC
  Administered 2016-05-10: 25 mg via INTRAVENOUS
  Filled 2016-05-10: qty 1

## 2016-05-10 MED ORDER — HYDROMORPHONE HCL 2 MG/ML IJ SOLN
1.0000 mg | Freq: Once | INTRAMUSCULAR | Status: AC
  Administered 2016-05-10: 1 mg via INTRAVENOUS
  Filled 2016-05-10: qty 1

## 2016-05-10 MED ORDER — MORPHINE SULFATE (PF) 4 MG/ML IV SOLN
4.0000 mg | INTRAVENOUS | Status: DC | PRN
  Administered 2016-05-10 – 2016-05-12 (×9): 4 mg via INTRAVENOUS
  Filled 2016-05-10 (×10): qty 1

## 2016-05-10 MED ORDER — OXYCODONE-ACETAMINOPHEN 5-325 MG PO TABS
1.0000 | ORAL_TABLET | ORAL | Status: DC | PRN
  Administered 2016-05-10 – 2016-05-13 (×14): 1 via ORAL
  Filled 2016-05-10 (×14): qty 1

## 2016-05-10 MED ORDER — LORAZEPAM 1 MG PO TABS
1.0000 mg | ORAL_TABLET | Freq: Four times a day (QID) | ORAL | Status: DC | PRN
  Administered 2016-05-10: 1 mg via ORAL
  Filled 2016-05-10: qty 1

## 2016-05-10 MED ORDER — ENOXAPARIN SODIUM 40 MG/0.4ML ~~LOC~~ SOLN
40.0000 mg | SUBCUTANEOUS | Status: DC
  Administered 2016-05-10 – 2016-05-12 (×3): 40 mg via SUBCUTANEOUS
  Filled 2016-05-10 (×3): qty 0.4

## 2016-05-10 MED ORDER — AMPHETAMINE-DEXTROAMPHET ER 5 MG PO CP24
15.0000 mg | ORAL_CAPSULE | ORAL | Status: DC
  Filled 2016-05-10 (×3): qty 3

## 2016-05-10 MED ORDER — BUPROPION HCL ER (XL) 150 MG PO TB24
300.0000 mg | ORAL_TABLET | Freq: Every day | ORAL | Status: DC
  Administered 2016-05-10 – 2016-05-13 (×4): 300 mg via ORAL
  Filled 2016-05-10 (×4): qty 2

## 2016-05-10 MED ORDER — TOPIRAMATE 100 MG PO TABS
100.0000 mg | ORAL_TABLET | Freq: Two times a day (BID) | ORAL | Status: DC
  Administered 2016-05-10 – 2016-05-13 (×6): 100 mg via ORAL
  Filled 2016-05-10 (×6): qty 1

## 2016-05-10 NOTE — ED Notes (Signed)
Patient stated that she fell head first into wall last night while bending over to plug in phone. Says she been saying things wrong...says nurse said it could be a side effect of medications and lumbar puncture.

## 2016-05-10 NOTE — ED Provider Notes (Signed)
[facility]-EMERGENCY DEPT Provider Note   CSN: [id number] Arrival date & time: 05/09/16  2220  By signing my name below, I, Linna DarnerRussell Turner, attest that this documentation has been prepared under the direction and in the presence of physician practitioner, Gilda Creasehristopher J Denee Boeder, MD. Electronically Signed: Linna Darnerussell Turner, Scribe. 05/10/2016. 1:33 AM.  History   Chief Complaint Chief Complaint  Patient presents with  . Headache    The history is provided by the patient. No language interpreter was used.     HPI Comments: [name] is a 36 y.o. female with PMHx including migraines and pseudotumor cerebri who presents to the Emergency Department complaining of a persistent headache beginning 4 days ago. She reports associated photophobia. Her neurologist performed a lumbar puncture 3 days ago, which she has received in the past for increased intracranial pressures, and her headache increased in severity afterwards. Patient spoke to her neurologist two days ago and had nerve blocks performed the same day which transiently improved her headache. She spoke to her neurologist shortly PTA tonight and was advised to come to the ED to receive an epidural blood patch. Pt reports she experiences migraines consistently and states her current headache is more severe than usual. She notes her headache is mildly improved with laying flat. Pt denies any other complaints at this time.   Past Medical History:  Diagnosis Date  . ADHD (attention deficit hyperactivity disorder)   . ADHD, adult residual type   . Anxiety   . Depression   . Family history of adverse reaction to anesthesia    sister has PONV  . GERD (gastroesophageal reflux disease)   . Headache    otc med prn - last one 2 months ago  . Pseudotumor cerebri     Patient Active Problem List   Diagnosis Date Noted  . Intractable chronic migraine without aura and with status migrainosus 05/09/2016  . Pseudotumor cerebri 05/06/2016  . Obesity  05/06/2016  . Chronic migraine 05/06/2016  . Pelvic pain in female 06/19/2015    Past Surgical History:  Procedure Laterality Date  . ABDOMINAL HYSTERECTOMY    . CESAREAN SECTION     x 2  . CYSTOSCOPY N/A 06/19/2015   Procedure: CYSTOSCOPY;  Surgeon: Jaymes GraffNaima Dillard, MD;  Location: [facility];  Service: Gynecology;  Laterality: N/A;  . LAPAROSCOPIC VAGINAL HYSTERECTOMY WITH SALPINGECTOMY Bilateral 06/19/2015   Procedure: LAPAROSCOPIC ASSISTED VAGINAL HYSTERECTOMY WITH SALPINGECTOMY;  Surgeon: Jaymes GraffNaima Dillard, MD;  Location: [facility];  Service: Gynecology;  Laterality: Bilateral;  . TONSILLECTOMY      OB History    Gravida Para Term Preterm AB Living   2 2 2     2    SAB TAB Ectopic Multiple Live Births                   Home Medications    Prior to Admission medications   Medication Sig Start Date End Date Taking? Authorizing Provider  acetaminophen-codeine (TYLENOL #3) 300-30 MG tablet Take 1-2 tablets by mouth every 8 (eight) hours as needed for moderate pain.  05/09/16  Yes Historical Provider, MD  amphetamine-dextroamphetamine (ADDERALL XR) 15 MG 24 hr capsule Take 15 mg by mouth every morning.   Yes Historical Provider, MD  amphetamine-dextroamphetamine (ADDERALL) 5 MG tablet Take 5 mg by mouth daily. 05/05/16  Yes Historical Provider, MD  buPROPion (WELLBUTRIN XL) 300 MG 24 hr tablet Take 300 mg by mouth daily. 04/15/16  Yes Historical Provider, MD  busPIRone (BUSPAR) 15 MG tablet Take  15 mg by mouth 2 (two) times daily. 04/15/16 04/15/17 Yes Historical Provider, MD  butalbital-acetaminophen-caffeine (FIORICET, ESGIC) 50-325-40 MG tablet Take 1 tablet by mouth every 6 (six) hours as needed for headache. 05/08/16  Yes Levert Feinstein, MD  clonazePAM (KLONOPIN) 1 MG tablet Take 1 mg by mouth 2 (two) times daily as needed for anxiety.  05/05/16 06/04/16 Yes Historical Provider, MD  ondansetron (ZOFRAN ODT) 4 MG disintegrating tablet Take 1 tablet (4 mg total) by mouth every 8 (eight) hours as needed for  nausea or vomiting. 05/06/16  Yes Levert Feinstein, MD  promethazine (PHENERGAN) 12.5 MG tablet Take 12.5 mg by mouth every 8 (eight) hours as needed for nausea.  05/06/16 05/13/16 Yes Historical Provider, MD  rizatriptan (MAXALT-MLT) 10 MG disintegrating tablet Take 1 tablet (10 mg total) by mouth as needed. May repeat in 2 hours if needed 05/06/16  Yes Levert Feinstein, MD  topiramate (TOPAMAX) 100 MG tablet Take 1 tablet (100 mg total) by mouth 2 (two) times daily. 05/06/16  Yes Levert Feinstein, MD  ibuprofen (ADVIL,MOTRIN) 600 MG tablet 1 po  pc every 6 hours for 5 days then as needed for pain Patient not taking: Reported on 05/10/2016 06/20/15   Henreitta Leber, PA-C    Family History Family History  Problem Relation Age of Onset  . Pneumonia Mother     Sepsis  . Heart attack Father     Social History Social History  Substance Use Topics  . Smoking status: Never Smoker  . Smokeless tobacco: Never Used  . Alcohol use Yes     Comment: occasional liquor     Allergies   Naproxen; Asa [aspirin]; Macrobid [nitrofurantoin monohyd macro]; Penicillins; and Sudafed [pseudoephedrine hcl]   Review of Systems Review of Systems  Eyes: Positive for photophobia.  Neurological: Positive for headaches.  All other systems reviewed and are negative.   Physical Exam Updated Vital Signs BP 109/64 (BP Location: Left Arm)   Pulse 92   Temp 97.5 F (36.4 C) (Oral)   Resp 18   LMP 06/12/2015 Comment: negative pregnancy test result today.  SpO2 97%   Physical Exam  Constitutional: She is oriented to person, place, and time. She appears well-developed and well-nourished. No distress.  HENT:  Head: Normocephalic and atraumatic.  Right Ear: Hearing normal.  Left Ear: Hearing normal.  Nose: Nose normal.  Mouth/Throat: Oropharynx is clear and moist and mucous membranes are normal.  Eyes: Conjunctivae and EOM are normal. Pupils are equal, round, and reactive to light.  Neck: Normal range of motion. Neck supple.    Cardiovascular: Regular rhythm, S1 normal and S2 normal.  Exam reveals no gallop and no friction rub.   No murmur heard. Pulmonary/Chest: Effort normal and breath sounds normal. No respiratory distress. She exhibits no tenderness.  Abdominal: Soft. Normal appearance and bowel sounds are normal. There is no hepatosplenomegaly. There is no tenderness. There is no rebound, no guarding, no tenderness at McBurney's point and negative Murphy's sign. No hernia.  Musculoskeletal: Normal range of motion.  Neurological: She is alert and oriented to person, place, and time. She has normal strength. No cranial nerve deficit or sensory deficit. Coordination normal. GCS eye subscore is 4. GCS verbal subscore is 5. GCS motor subscore is 6.  Skin: Skin is warm, dry and intact. No rash noted. No cyanosis.  Psychiatric: She has a normal mood and affect. Her speech is normal and behavior is normal. Thought content normal.  Nursing note and vitals reviewed.  ED Treatments / Results  Labs (all labs ordered are listed, but only abnormal results are displayed) Labs Reviewed - No data to display  EKG  EKG Interpretation None       Radiology No results found.  Procedures Procedures (including critical care time)  DIAGNOSTIC STUDIES: Oxygen Saturation is 99% on RA, normal by my interpretation.    COORDINATION OF CARE: 1:44 AM Discussed treatment plan with pt at bedside and pt agreed to plan.  Medications Ordered in ED Medications  sodium chloride 0.9 % bolus 1,000 mL (0 mLs Intravenous Stopped 05/10/16 0403)  HYDROmorphone (DILAUDID) injection 1 mg (1 mg Intravenous Given 05/10/16 0241)  prochlorperazine (COMPAZINE) injection 10 mg (10 mg Intravenous Given 05/10/16 0241)  diphenhydrAMINE (BENADRYL) injection 25 mg (25 mg Intravenous Given 05/10/16 0242)     Initial Impression / Assessment and Plan / ED Course  I have reviewed the triage vital signs and the nursing notes.  Pertinent labs & imaging  results that were available during my care of the patient were reviewed by me and considered in my medical decision making (see chart for details).     Patient presents to the emergency department for evaluation of headache. Patient has a history of chronic headaches and migraines secondary to pseudotumor cerebri. She underwent therapeutic lumbar puncture several days ago. She has had improvement of the original headache, but now has a severe headache that is very positional. It resolves when she lies down but becomes quite severe if she sits up or stands. This is felt to represent post lumbar puncture headache. Patient has been given IV fluids and analgesia, arrangements have been made with interventional radiologist, Dr. Deanne Coffer, and she will undergo blood patch later this morning.  Final Clinical Impressions(s) / ED Diagnoses   Final diagnoses:  Post lumbar puncture headache    New Prescriptions New Prescriptions   No medications on file   I personally performed the services described in this documentation, which was scribed in my presence. The recorded information has been reviewed and is accurate.    Gilda Crease, MD 05/10/16 0600

## 2016-05-10 NOTE — Progress Notes (Signed)
Text page MD on call regarding patient request.

## 2016-05-10 NOTE — H&P (Signed)
Date: 05/10/2016               Patient Name:  [name] MRN: [id number]  DOB: 06-23-1980 Age / Sex: 36 y.o., female   PCP: [name], PA-C         Medical Service: Internal Medicine Teaching Service         Attending Physician: Dr. Tyson Alias, MD    First Contact: Dr. Carolynn Comment Pager: [phone]  Second Contact: Dr. Orest Dikes Pager: [phone]       After Hours (After 5p /  First Contact Pager: [phone]  Weekends / Holidays): Second Contact Pager: [phone]   Chief Complaint: HA  History of Present Illness: Ms. BAKER KOGLER is a 36 y.o. female with a h/o of obesity, migraines and pseudotumor cerebri, s/p hysterectomy who presents with severe HA and episodic nausea after LP on 05/07/16.  Patient reports that she was diagnosed with pseudotumor cerebri back in 2011 when she had her first lumbar puncture which revealed elevated ICP at greater than 44 cm water. She was started on Diamox for several years until losing insurance coverage. Patient has also had a long history of migraine headaches which typically present as bilateral occipital headaches that are associated with photophobia. She has taken multiple different OTC medications for this through the years with varying success. Losing insurance coverage she has had no follow-up with neurology for her pseudotumor or her migraines. She has recently reestablished insurance coverage and presented to [facility] neurology for evaluation of worsening migraines on 05/06/2016. At that visit she was started on Topamax and a lumbar puncture was scheduled for the following day. Patient reports that she began taking the Topamax which made her feel somewhat confused, and resulted in a lateral paresthesias of the upper extremities and hands on 05/07/2016 she presented for lumbar puncture at which time her ICP was noted to be 22 cm of water. She underwent therapeutic drainage to 10 cm of water. That evening she developed severe  headache which was associated with upright posture and relieved with lying supine. She will return to neurology care on 05/08/2016 and was treated with multiple trigger point injections which provided a limited amount of relief. Over the next 2 days patient's headaches reportedly worsened and on Friday evening Neurology recommended that she try Tylenol 3 for pain relief until Monday when she could be evaluated again in the outpatient clinic. Patient reported no relief from this medication and it was recommended that she present to the emergency department for pain control.  Arrival to emergency department she was treated with IV Dilaudid, Compazine and Benadryl with good relief of her symptoms. Interventional radiology was consulted for concern of post-LP headache requiring blood patch. Was recommended that this procedure could not be performed until full staff was available on Monday and IMTS has was called to admit for pain control.  On interview the patient was lying supine in no acute distress with the lights off. She is able to participate in interview reporting that she was feeling somewhat better after IV medication cocktail. She notes that she has severe exacerbation of her symptoms with sitting up. She denies any fevers or chills. She denies any focal neurological deficits. She reports that she experienced similar headaches in 2011 after her first lumbar puncture. However this initial LP was also compensated by transient cauda equina symptoms and urinary retention. He is not experienced any neurological complications including urinary retention, focal weakness, or new paresthesias (other than those  which began after Topamax). She describes her current headaches as significant only different from her migraine. She has no auras presently and notes that her pain is most prominent in the bilateral frontal regions and feels like a "vice-grip." Patient does endorse some mild tenderness to palpation over her  neck but has no significant stiffness or meningeal signs with negative Brudzinski and Kernig signs. Patient was denying significant nausea during our interview and is willing to try oral medications.  Meds: Medications Prior to Admission  Medication Sig Dispense Refill  . acetaminophen-codeine (TYLENOL #3) 300-30 MG tablet Take 1-2 tablets by mouth every 8 (eight) hours as needed for moderate pain.   0  . amphetamine-dextroamphetamine (ADDERALL XR) 15 MG 24 hr capsule Take 15 mg by mouth every morning.    Marland Kitchen. amphetamine-dextroamphetamine (ADDERALL) 5 MG tablet Take 5 mg by mouth daily.  0  . buPROPion (WELLBUTRIN XL) 300 MG 24 hr tablet Take 300 mg by mouth daily.  4  . busPIRone (BUSPAR) 15 MG tablet Take 15 mg by mouth 2 (two) times daily.    . butalbital-acetaminophen-caffeine (FIORICET, ESGIC) 50-325-40 MG tablet Take 1 tablet by mouth every 6 (six) hours as needed for headache. 15 tablet 0  . clonazePAM (KLONOPIN) 1 MG tablet Take 1 mg by mouth 2 (two) times daily as needed for anxiety.     . ondansetron (ZOFRAN ODT) 4 MG disintegrating tablet Take 1 tablet (4 mg total) by mouth every 8 (eight) hours as needed for nausea or vomiting. 20 tablet 11  . promethazine (PHENERGAN) 12.5 MG tablet Take 12.5 mg by mouth every 8 (eight) hours as needed for nausea.     . rizatriptan (MAXALT-MLT) 10 MG disintegrating tablet Take 1 tablet (10 mg total) by mouth as needed. May repeat in 2 hours if needed 15 tablet 6  . topiramate (TOPAMAX) 100 MG tablet Take 1 tablet (100 mg total) by mouth 2 (two) times daily. 60 tablet 11  . [DISCONTINUED] ibuprofen (ADVIL,MOTRIN) 600 MG tablet 1 po  pc every 6 hours for 5 days then as needed for pain (Patient not taking: Reported on 05/10/2016) 30 tablet 1   Allergies: Allergies as of 05/09/2016 - Review Complete 05/09/2016  Allergen Reaction Noted  . Naproxen Nausea And Vomiting and Other (See Comments) 09/14/2014  . Asa [aspirin] Other (See Comments) 09/24/2012  .  Macrobid [nitrofurantoin monohyd macro] Other (See Comments) 01/13/2014  . Penicillins Hives 09/24/2012  . Sudafed [pseudoephedrine hcl] Hives 09/24/2012   Past Medical History:  Diagnosis Date  . ADHD (attention deficit hyperactivity disorder)   . ADHD, adult residual type   . Anxiety   . Depression   . Family history of adverse reaction to anesthesia    sister has PONV  . GERD (gastroesophageal reflux disease)   . Headache    otc med prn - last one 2 months ago  . 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 Pseudotumor cerebri    Family History: Pt family history includes Heart attack in her father; Pneumonia in her mother.  Social History: Pt  reports that she has never smoked. She has never used smokeless tobacco. She reports that she drinks alcohol. She reports that she does not use drugs.  Review of Systems: A complete ROS was negative except as per HPI. Review of Systems  Constitutional: Negative for chills, fever and weight loss.  Eyes: Negative for blurred vision.  Respiratory: Negative for cough and shortness of breath.   Cardiovascular: Negative for chest pain  and leg swelling.  Gastrointestinal: Negative for abdominal pain, constipation, diarrhea, nausea and vomiting.  Genitourinary: Negative for dysuria, frequency and urgency.  Musculoskeletal: Negative for myalgias.  Skin: Negative for rash.  Neurological: Positive for sensory change (BLUE numbness/tingling) and headaches. Negative for dizziness and tremors.  Endo/Heme/Allergies: Negative for polydipsia.  Psychiatric/Behavioral: The patient is not nervous/anxious.    Physical Exam: Vitals:   05/10/16 1000 05/10/16 1030 05/10/16 1404 05/10/16 1454  BP: 108/62 108/56 129/75 116/79  Pulse: 91 94 101 86  Resp:    18  Temp:    98.1 F (36.7 C)  TempSrc:    Oral  SpO2: 99% 100% 100% 99%   Physical Exam  Constitutional: She is oriented to person, place, and time. She appears well-developed. She is cooperative.  No distress.  Obese lady lying supine in dark room with ice pack on her forehead, participates fully in the interview without any acute distress  HENT:  Head: Normocephalic and atraumatic.  Right Ear: Hearing normal.  Left Ear: Hearing normal.  Nose: Nose normal.  Mouth/Throat: Mucous membranes are normal.  Eyes: EOM are normal. Pupils are equal, round, and reactive to light.  Fundoscopic exam:      The right eye shows no papilledema.       The left eye shows no papilledema.  Cardiovascular: Normal rate, regular rhythm, S1 normal, S2 normal and intact distal pulses.  Exam reveals no gallop.   No murmur heard. Pulmonary/Chest: Effort normal and breath sounds normal. No respiratory distress. She has no wheezes. She has no rhonchi. She has no rales. She exhibits no tenderness. Breasts are symmetrical.  Abdominal: Soft. Normal appearance and bowel sounds are normal. She exhibits no ascites. There is no hepatosplenomegaly. There is no tenderness. There is no CVA tenderness.  Musculoskeletal: Normal range of motion.  Neurological: She is alert and oriented to person, place, and time. She has normal strength. No cranial nerve deficit or sensory deficit. She exhibits normal muscle tone. Coordination normal.  Skin: Skin is warm, dry and intact. She is not diaphoretic.  Psychiatric: She has a normal mood and affect. Her speech is normal and behavior is normal.   Labs:  Recent Labs Lab 05/10/16 1603  HGB 12.6  HCT 38.2  WBC 10.5  PLT 321   Recent Labs Lab 05/10/16 1603  INR 1.04   Assessment & Plan by Problem: Active Problems:   Post lumbar puncture headache  Ms. KATHERYN CULLITON is a 36 y.o. female with h/o migraines and pseudotumor who developed severe postural HA after LP.  1) Post-LP HA: Pt has classic findings for post-LP HA. She has severe postural HA. Low suspicion for CNS infection w/o constitutional symptoms or meningeal signs. May be exacerbated by chronic migraines, but pt  describes different sensation w/o aura. Will likely need blood-patch for definitive resolution which is not available per IR until Monday. We will admit for pain control and monitoring. Currently w/o FND. - admit to med-surg - Percocet 5-325mg  q4h PRN pain - Phenergan PO 12.5mg  q8h PRN N/V - CBC + PT/aPTT for LP prep - VIR c/s for blood patch  2) Pseudotumor cerebri: First diagnosed in 2011. H/o Diamox, but had significant SE of tongue soreness. Has been contemplating bariatric surgery for wt loss and definitive treatment. Recently re-established care with Neurology, underwent LP w/ 22cm H2O, therapeutically drained to ~10cm H2O. No vision changes w/o papilledema on exam.  3) Migraines: h/o migraines typically w/ phonophobia. No prominent aura. Has had limited  relief with OTC meds such at Fiorecet, NSAIDS, Tylenol but had long h/o daily use. No d/c OTC meds for concern of medication overuse and recently initiated Topiramate by Neurology. - continue home Topiramate 100mg  BID  4) ADHD / Anxiety: Pt w/ h/o GAD and ADHD. Home meds: bupropion, buspirone, Adderall XR + Adderall, clonazepam. - continue bupropion, Adderall - can give Ativan PRN anxiety  DVT PPx - low molecular weight heparin  Code Status - Full  Consults Placed - VIR for blood patch  Dispo: Admit patient to Inpatient with expected length of stay greater than 2 midnights.  Signed: Carolynn Comment, MD 05/10/2016, 4:21 PM  Pager: [phone]

## 2016-05-10 NOTE — Progress Notes (Signed)
Patient given PRN medication for stated headache. Patient denies relief from medication. Resting in bed with family at bedside.  No distress noted.

## 2016-05-10 NOTE — ED Provider Notes (Signed)
Pt still with a headache.  We called IR to see what was taking so long to do the blood patch.  The IR doctor on today, said the staff for IR was not at the hospital and would not come in to do the blood patch.  They would do it if they had to come in to do another procedure.  I explained this to patient as she's been waiting for many hours.  She has been taking pain meds at home which have not been helping.  I spoke with the IM teaching service who will bring her in for pain control.   Jacalyn LefevreJulie Tamiko Leopard, MD 05/10/16 [phone]

## 2016-05-10 NOTE — ED Notes (Signed)
Patient ambulated to the restroom without assistance 

## 2016-05-10 NOTE — Progress Notes (Signed)
Patient arrived to room.  Assisted to bed. Alert, oriented to room.

## 2016-05-11 DIAGNOSIS — G932 Benign intracranial hypertension: Secondary | ICD-10-CM | POA: Diagnosis present

## 2016-05-11 DIAGNOSIS — F419 Anxiety disorder, unspecified: Secondary | ICD-10-CM | POA: Diagnosis present

## 2016-05-11 DIAGNOSIS — Z79899 Other long term (current) drug therapy: Secondary | ICD-10-CM | POA: Diagnosis not present

## 2016-05-11 DIAGNOSIS — Z881 Allergy status to other antibiotic agents status: Secondary | ICD-10-CM | POA: Diagnosis not present

## 2016-05-11 DIAGNOSIS — G971 Other reaction to spinal and lumbar puncture: Secondary | ICD-10-CM | POA: Diagnosis present

## 2016-05-11 DIAGNOSIS — E669 Obesity, unspecified: Secondary | ICD-10-CM | POA: Diagnosis present

## 2016-05-11 DIAGNOSIS — Y844 Aspiration of fluid as the cause of abnormal reaction of the patient, or of later complication, without mention of misadventure at the time of the procedure: Secondary | ICD-10-CM | POA: Diagnosis present

## 2016-05-11 DIAGNOSIS — K219 Gastro-esophageal reflux disease without esophagitis: Secondary | ICD-10-CM | POA: Diagnosis present

## 2016-05-11 DIAGNOSIS — Z886 Allergy status to analgesic agent status: Secondary | ICD-10-CM | POA: Diagnosis not present

## 2016-05-11 DIAGNOSIS — Z6841 Body Mass Index (BMI) 40.0 and over, adult: Secondary | ICD-10-CM | POA: Diagnosis not present

## 2016-05-11 DIAGNOSIS — Z880 Allergy status to penicillin: Secondary | ICD-10-CM | POA: Diagnosis not present

## 2016-05-11 DIAGNOSIS — F909 Attention-deficit hyperactivity disorder, unspecified type: Secondary | ICD-10-CM | POA: Diagnosis present

## 2016-05-11 DIAGNOSIS — G43709 Chronic migraine without aura, not intractable, without status migrainosus: Secondary | ICD-10-CM | POA: Diagnosis present

## 2016-05-11 DIAGNOSIS — Z8249 Family history of ischemic heart disease and other diseases of the circulatory system: Secondary | ICD-10-CM | POA: Diagnosis not present

## 2016-05-11 DIAGNOSIS — F411 Generalized anxiety disorder: Secondary | ICD-10-CM | POA: Diagnosis not present

## 2016-05-11 DIAGNOSIS — Z90710 Acquired absence of both cervix and uterus: Secondary | ICD-10-CM | POA: Diagnosis not present

## 2016-05-11 DIAGNOSIS — G43009 Migraine without aura, not intractable, without status migrainosus: Secondary | ICD-10-CM | POA: Diagnosis not present

## 2016-05-11 DIAGNOSIS — Z888 Allergy status to other drugs, medicaments and biological substances status: Secondary | ICD-10-CM | POA: Diagnosis not present

## 2016-05-11 LAB — HIV ANTIBODY (ROUTINE TESTING W REFLEX): HIV Screen 4th Generation wRfx: NONREACTIVE

## 2016-05-11 MED ORDER — SALINE SPRAY 0.65 % NA SOLN
1.0000 | NASAL | Status: DC | PRN
  Administered 2016-05-12: 1 via NASAL
  Filled 2016-05-11: qty 44

## 2016-05-11 MED ORDER — WHITE PETROLATUM GEL
Status: DC | PRN
  Administered 2016-05-12: via TOPICAL
  Filled 2016-05-11: qty 1

## 2016-05-11 MED ORDER — SENNOSIDES-DOCUSATE SODIUM 8.6-50 MG PO TABS
1.0000 | ORAL_TABLET | Freq: Two times a day (BID) | ORAL | Status: DC | PRN
Start: 2016-05-11 — End: 2016-05-13
  Administered 2016-05-11 – 2016-05-12 (×3): 1 via ORAL
  Filled 2016-05-11 (×3): qty 1

## 2016-05-11 MED ORDER — BUTALBITAL-APAP-CAFFEINE 50-325-40 MG PO TABS
1.0000 | ORAL_TABLET | ORAL | Status: DC | PRN
  Administered 2016-05-11 – 2016-05-13 (×3): 1 via ORAL
  Filled 2016-05-11 (×4): qty 1

## 2016-05-11 NOTE — Progress Notes (Signed)
   Subjective: Currently, the patient is feeling much improved and has been able to tolerate sitting up in bed this morning. She had some mild nausea ON w/ diet. Good relief of pain with morphine and oxy.  Reports some exacerbation of her ADHD sx with ativan, will hold this.  Objective: Vital signs in last 24 hours: Vitals:   05/10/16 2059 05/11/16 0101 05/11/16 0400 05/11/16 0500  BP: (!) 111/50 106/70  110/63  Pulse: 78 93  86  Resp: 18 18  20   Temp: 98.1 F (36.7 C) 97.5 F (36.4 C)  97.3 F (36.3 C)  TempSrc: Oral Oral  Oral  SpO2: 99% 99%  99%  Weight:   262 lb 2 oz (118.9 kg)   Height:   5\' 4"  (1.626 m)    Physical Exam: Physical Exam  Constitutional: She is oriented to person, place, and time. She appears well-developed. She is cooperative. No distress.  Cardiovascular: Normal rate, regular rhythm, normal heart sounds and normal pulses.  Exam reveals no gallop.   No murmur heard. Pulmonary/Chest: Effort normal and breath sounds normal. No respiratory distress. Breasts are symmetrical.  Abdominal: Soft. Bowel sounds are normal. There is no tenderness.  Musculoskeletal: She exhibits no edema.  Neurological: She is alert and oriented to person, place, and time.   Labs: CBC:  Recent Labs Lab 05/10/16 1603  WBC 10.5  HGB 12.6  HCT 38.2  MCV 90.1  PLT 321   Metabolic Panel:  Recent Labs Lab 05/10/16 1603  NA 137  K 3.9  CL 111  CO2 19*  GLUCOSE 83  BUN 17  CREATININE 0.97  CALCIUM 8.9  LABPROT 13.6  INR 1.04    Medications: Scheduled Medications: . amphetamine-dextroamphetamine  15 mg Oral BH-q7a  . buPROPion  300 mg Oral Daily  . enoxaparin (LOVENOX) injection  40 mg Subcutaneous Q24H  . topiramate  100 mg Oral BID   PRN Medications: butalbital-acetaminophen-caffeine, diphenhydrAMINE, morphine injection, oxyCODONE-acetaminophen, promethazine  Assessment/Plan: [name] is a 36 y.o. female with h/o migraines and pseudotumor who  developed severe postural HA after LP.  1) Post-LP HA: Improving. Pain controlled on narcotic regimen, will maximize non-narcotics and oral meds today w/ addition of caffeine. May need blood-patch for definitive resolution if not resolved by Monday. - add Fioricet for caffeine q4h PRN HA - Percocet 5-325mg  q4h PRN pain - Phenergan PO 12.5mg  q8h PRN N/V - Benedryl 25mg  PO q6h PRN itching 2/2 narcotics - wean IV morphine today as possible - VIR c/s Monday for blood patch  2) Pseudotumor cerebri: First diagnosed in 2011. H/o Diamox, but had significant SE of tongue soreness. Has been contemplating bariatric surgery for wt loss and definitive treatment. Recently re-established care with Neurology, underwent LP w/ 22cm H2O, therapeutically drained to ~10cm H2O. No vision changes w/o papilledema on exam.  3) Migraines: h/o migraines typically w/ phonophobia. No prominent aura. Has had limited relief with OTC meds such at Fiorecet, NSAIDS, Tylenol but had long h/o daily use. No d/c OTC meds for concern of medication overuse and recently initiated Topiramate by Neurology. - continue home Topiramate 100mg  BID  4) ADHD / Anxiety: Pt w/ h/o GAD and ADHD. Home meds: bupropion, buspirone, Adderall XR + Adderall, clonazepam. Had exacerbation of ADHD sx w/ Ativan. - continue bupropion, Adderall - hold benzos  Length of Stay: 0 day(s) Dispo: Anticipated discharge in approximately 1 day.  Margaret CommentBryan Margaret Kemler, MD Pager: [phone] (7AM-5PM) 05/11/2016, 11:29 AM

## 2016-05-11 NOTE — Progress Notes (Signed)
Paged on call MD Amin  to clarify about weaning pt off  iv Morphine per report from day shift RN . MD stated to continue with the morphine and decision will be made in am by the team.

## 2016-05-12 ENCOUNTER — Encounter (HOSPITAL_COMMUNITY): Payer: Self-pay | Admitting: Diagnostic Radiology

## 2016-05-12 ENCOUNTER — Inpatient Hospital Stay (HOSPITAL_COMMUNITY): Payer: BLUE CROSS/BLUE SHIELD

## 2016-05-12 HISTORY — PX: IR GENERIC HISTORICAL: IMG1180011

## 2016-05-12 MED ORDER — DIAZEPAM 5 MG PO TABS
ORAL_TABLET | ORAL | Status: AC
  Filled 2016-05-12: qty 2

## 2016-05-12 MED ORDER — PANTOPRAZOLE SODIUM 20 MG PO TBEC
20.0000 mg | DELAYED_RELEASE_TABLET | Freq: Every day | ORAL | Status: DC
  Administered 2016-05-12 – 2016-05-13 (×2): 20 mg via ORAL
  Filled 2016-05-12 (×2): qty 1

## 2016-05-12 MED ORDER — LIDOCAINE HCL (PF) 1 % IJ SOLN
INTRAMUSCULAR | Status: DC | PRN
  Administered 2016-05-12: 5 mL

## 2016-05-12 MED ORDER — IOPAMIDOL (ISOVUE-300) INJECTION 61%
INTRAVENOUS | Status: AC
  Administered 2016-05-12: 2 mL
  Filled 2016-05-12: qty 50

## 2016-05-12 MED ORDER — DIAZEPAM 5 MG PO TABS
10.0000 mg | ORAL_TABLET | Freq: Once | ORAL | Status: AC
  Administered 2016-05-12: 10 mg via ORAL

## 2016-05-12 MED ORDER — SODIUM CHLORIDE 0.9 % IJ SOLN
INTRAMUSCULAR | Status: AC
  Filled 2016-05-12: qty 10

## 2016-05-12 MED ORDER — BISACODYL 10 MG RE SUPP
10.0000 mg | Freq: Every day | RECTAL | Status: DC | PRN
  Administered 2016-05-12: 10 mg via RECTAL
  Filled 2016-05-12: qty 1

## 2016-05-12 MED ORDER — MORPHINE SULFATE (PF) 4 MG/ML IV SOLN
4.0000 mg | INTRAVENOUS | Status: AC | PRN
  Administered 2016-05-12 (×2): 4 mg via INTRAVENOUS
  Filled 2016-05-12 (×2): qty 1

## 2016-05-12 MED ORDER — LIDOCAINE HCL (PF) 1 % IJ SOLN
INTRAMUSCULAR | Status: AC
  Filled 2016-05-12: qty 10

## 2016-05-12 NOTE — Telephone Encounter (Addendum)
Spoke to patient - reports headache continued to progress over the weekend and she went to [facility] and was admitted.  She still has a residual headache this morning and will be having a blood patch later today.

## 2016-05-12 NOTE — Progress Notes (Addendum)
   Subjective: Currently, the patient is feeling okay, but still reliant on IV morphine + oxy for relief. Can tolerate sitting to eat, but not for prolonged periods of time. Mild nausea may be SE from narcotics or sx of CSF leak post-LP.  Objective: Vital signs in last 24 hours: Vitals:   05/11/16 1430 05/11/16 2035 05/12/16 0020 05/12/16 0450  BP: 112/73 99/82 103/79 129/64  Pulse: 82 (!) 109 88 (!) 105  Resp: 16 20 20 20   Temp: 98.1 F (36.7 C) 97.5 F (36.4 C) 98.1 F (36.7 C) 97.9 F (36.6 C)  TempSrc: Oral Oral Oral Oral  SpO2: 99% 99% 99% 100%  Weight:      Height:       Physical Exam: Physical Exam  Constitutional: She is oriented to person, place, and time. She appears well-developed. She is cooperative. No distress.  Cardiovascular: Normal rate, regular rhythm, normal heart sounds and normal pulses.  Exam reveals no gallop.   No murmur heard. Pulmonary/Chest: Effort normal and breath sounds normal. No respiratory distress. Breasts are symmetrical.  Abdominal: Soft. Bowel sounds are normal. There is no tenderness.  Musculoskeletal: She exhibits no edema.  Neurological: She is alert and oriented to person, place, and time.   Labs: CBC:  Recent Labs Lab 05/10/16 1603  WBC 10.5  HGB 12.6  HCT 38.2  MCV 90.1  PLT 321   Metabolic Panel:  Recent Labs Lab 05/10/16 1603  NA 137  K 3.9  CL 111  CO2 19*  GLUCOSE 83  BUN 17  CREATININE 0.97  CALCIUM 8.9  LABPROT 13.6  INR 1.04    Medications: Scheduled Medications: . amphetamine-dextroamphetamine  15 mg Oral BH-q7a  . buPROPion  300 mg Oral Daily  . enoxaparin (LOVENOX) injection  40 mg Subcutaneous Q24H  . topiramate  100 mg Oral BID   PRN Medications: butalbital-acetaminophen-caffeine, diphenhydrAMINE, oxyCODONE-acetaminophen, promethazine, senna-docusate, sodium chloride, white petrolatum  Assessment/Plan: [name] is a 36 y.o. female with h/o migraines and pseudotumor who developed  severe postural HA after LP.  1) Post-LP HA: Improving. Still unable to tolerate sitting upright for prolonged periods. Pain controlled on narcotic regimen, will maximize non-narcotics and oral meds today w/ addition of caffeine. Likely needs blood-patch for definitive resolution, spoke w/ IR for scheduling today. - Fioricet for caffeine q4h PRN HA - Percocet 5-325mg  q4h PRN pain - Phenergan PO 12.5mg  q8h PRN N/V - Benedryl 25mg  PO q6h PRN itching 2/2 narcotics - IV morphine until procedure, then wean to orals  2) Pseudotumor cerebri: First diagnosed in 2011. H/o Diamox, but had significant SE of tongue soreness. Has been contemplating bariatric surgery for wt loss and definitive treatment. Recently re-established care with Neurology, underwent LP w/ 22cm H2O, therapeutically drained to ~10cm H2O. No vision changes w/o papilledema on exam.  3) Migraines: h/o migraines typically w/ phonophobia. No prominent aura. Has had limited relief with OTC meds such at Fiorecet, NSAIDS, Tylenol but had long h/o daily use. No d/c OTC meds for concern of medication overuse and recently initiated Topiramate by Neurology. - continue home Topiramate 100mg  BID  4) ADHD / Anxiety: Pt w/ h/o GAD and ADHD. Home meds: bupropion, buspirone, Adderall XR + Adderall, clonazepam. Had exacerbation of ADHD sx w/ Ativan. - continue bupropion, Adderall - hold benzos  Length of Stay: 1 day(s) Dispo: Anticipated discharge in approximately 1 day.  Margaret CommentBryan Haja Crego, MD Pager: [phone] (7AM-5PM) 05/12/2016, 9:00 AM

## 2016-05-12 NOTE — Procedures (Signed)
Blood patch left L2/3.  20cc blood drawn by myself from rt antecubital fossa.  Epidural approach performed on left at L2/3 with 20 g spinal needle.  15cc blood instilled into epidural space.  Well tolerated.  No apparent complication.

## 2016-05-12 NOTE — Telephone Encounter (Signed)
Please check on patient

## 2016-05-12 NOTE — Care Management Note (Signed)
Case Management Note  Patient Details  Name: [name] MRN: [id number] Date of Birth: 1980/09/06  Subjective/Objective:        Patient was admitted with a post-LP headache. Lives at home with spouse. CM will follow for discharge needs pending patient's progress and physician orders.          Action/Plan:   Expected Discharge Date:                  Expected Discharge Plan:     In-House Referral:     Discharge planning Services     Post Acute Care Choice:    Choice offered to:     DME Arranged:    DME Agency:     HH Arranged:    HH Agency:     Status of Service:     If discussed at [employer] Length of Stay Meetings, dates discussed:    Additional Comments:  Anda KraftRobarge, Cheyla Duchemin C, RN 05/12/2016, 10:31 AM

## 2016-05-13 DIAGNOSIS — Z880 Allergy status to penicillin: Secondary | ICD-10-CM

## 2016-05-13 DIAGNOSIS — Z886 Allergy status to analgesic agent status: Secondary | ICD-10-CM

## 2016-05-13 DIAGNOSIS — Z888 Allergy status to other drugs, medicaments and biological substances status: Secondary | ICD-10-CM

## 2016-05-13 DIAGNOSIS — Z881 Allergy status to other antibiotic agents status: Secondary | ICD-10-CM

## 2016-05-13 MED ORDER — OXYCODONE-ACETAMINOPHEN 5-325 MG PO TABS: 1.0000 | ORAL_TABLET | ORAL | 0 refills | Status: DC | PRN

## 2016-05-13 MED ORDER — MORPHINE SULFATE (PF) 4 MG/ML IV SOLN
4.0000 mg | Freq: Once | INTRAVENOUS | Status: AC
  Administered 2016-05-13: 4 mg via INTRAVENOUS
  Filled 2016-05-13: qty 1

## 2016-05-13 NOTE — Discharge Summary (Signed)
Name: [name] MRN: [id number] DOB: Apr 26, 1980 36 y.o. PCP: [name], PA-C  Date of Admission: 05/10/2016  1:09 AM Date of Discharge: 05/13/2016 Attending Physician: Gust Rung, DO  Discharge Diagnosis: Active Problems:   Post lumbar puncture headache   Discharge Medications: Allergies as of 05/13/2016      Reactions   Naproxen Nausea And Vomiting, Other (See Comments)   cramping   Asa [aspirin] Other (See Comments)   Stomach pain   Macrobid [nitrofurantoin Monohyd Macro] Other (See Comments)   Stomach pain   Penicillins Hives   Has patient had a PCN reaction causing immediate rash, facial/tongue/throat swelling, SOB or lightheadedness with hypotension: No Has patient had a PCN reaction causing severe rash involving mucus membranes or skin necrosis: No Has patient had a PCN reaction that required hospitalization No Has patient had a PCN reaction occurring within the last 10 years: No If all of the above answers are "NO", then may proceed with Cephalosporin use.   Sudafed [pseudoephedrine Hcl] Hives      Medication List    STOP taking these medications   acetaminophen-codeine 300-30 MG tablet Commonly known as:  TYLENOL #3 Replaced by:  oxyCODONE-acetaminophen 5-325 MG tablet     TAKE these medications   amphetamine-dextroamphetamine 5 MG tablet Commonly known as:  ADDERALL Take 5 mg by mouth daily.   amphetamine-dextroamphetamine 15 MG 24 hr capsule Commonly known as:  ADDERALL XR Take 15 mg by mouth every morning.   buPROPion 300 MG 24 hr tablet Commonly known as:  WELLBUTRIN XL Take 300 mg by mouth daily.   busPIRone 15 MG tablet Commonly known as:  BUSPAR Take 15 mg by mouth 2 (two) times daily.   butalbital-acetaminophen-caffeine 50-325-40 MG tablet Commonly known as:  FIORICET, ESGIC Take 1 tablet by mouth every 6 (six) hours as needed for headache.   clonazePAM 1 MG tablet Commonly known as:  KLONOPIN Take 1 mg by mouth 2 (two)  times daily as needed for anxiety.   ondansetron 4 MG disintegrating tablet Commonly known as:  ZOFRAN ODT Take 1 tablet (4 mg total) by mouth every 8 (eight) hours as needed for nausea or vomiting.   oxyCODONE-acetaminophen 5-325 MG tablet Commonly known as:  PERCOCET/ROXICET Take 1 tablet by mouth every 4 (four) hours as needed for moderate pain or severe pain. Replaces:  acetaminophen-codeine 300-30 MG tablet   promethazine 12.5 MG tablet Commonly known as:  PHENERGAN Take 12.5 mg by mouth every 8 (eight) hours as needed for nausea.   rizatriptan 10 MG disintegrating tablet Commonly known as:  MAXALT-MLT Take 1 tablet (10 mg total) by mouth as needed. May repeat in 2 hours if needed   topiramate 100 MG tablet Commonly known as:  TOPAMAX Take 1 tablet (100 mg total) by mouth 2 (two) times daily.       Disposition and follow-up:   Ms.Keith J Basilio was discharged from [facility] in Good condition.  At the hospital follow up visit please address:  1.  Post-LP Headache: Have symptoms resolved completely?  Follow-up Appointments: Follow-up Information    [name], PA-C Follow up.   Specialty:  Physician Assistant Contact information: [address] [place] [address] [phone]        Levert Feinstein, MD Follow up.   Specialty:  Neurology Contact information: [address] [place] [address] [phone]           Hospital Course by problem list:  Active Problems:   Post lumbar puncture headache   1) Post-LP HA: Pt had classic findings for post-LP HA. She had severe postural HA follow LP on 05/07/16 preformed at her Neurologists outpt office. She did not respond to various outpt treatments and was referred to the ED for bloodpatch and pain control. We had low suspicion for CNS infection w/o constitutional symptoms or meningeal signs. She does have chronic migraines, but pt described different sensation w/o  aura. She had no FND. She was admitted over the weekend for pain control with Percocet and IV morphine until bloodpatch could be completed on Monday. HA improved and she was discharged with 2 day supply of Percocet for some residual back pain related to this procedure.  2) Pseudotumor cerebri: First diagnosed in 2011. H/o Diamox, but had significant SE of tongue soreness. Has been contemplating bariatric surgery for wt loss and definitive treatment. Recently re-established care with Neurology, underwent LP w/ 22cm H2O, therapeutically drained to ~10cm H2O. No vision changes w/o papilledema on exam. She should continue to follow with Neurology for this.  3) Migraines: h/o migraines typically w/ phonophobia. No prominent aura. Has had limited relief with OTC meds such at Fiorecet, NSAIDS, Tylenol but had long h/o daily use. She has recently d/c'd OTC meds for concern of medication overuse and recently initiated Topiramate by Neurology. She should continue to follow up with Neurology for this.  Discharge Vitals:   BP 129/75 (BP Location: Right Arm)   Pulse (!) 108   Temp 98.4 F (36.9 C) (Oral)   Resp 18   Ht 5\' 4"  (1.626 m)   Wt 262 lb 2 oz (118.9 kg)   LMP 06/12/2015 Comment: negative pregnancy test result today.  SpO2 98%   BMI 44.99 kg/m   Pertinent Labs, Studies, and Procedures: As below.  Procedures Performed:  Ir Fluoro Guide Ndl Plmt / Bx  Result Date: 05/12/2016 CLINICAL DATA:  Positional headache since lumbar puncture last week. Unresponsive to conservative therapy. EXAM: LUMBAR EPIDURAL BLOOD PATCH COMPARISON:  05/07/2016 PROCEDURE: The procedure, risks, benefits, and alternatives were explained to the patient. Questions regarding the procedure were encouraged and answered. The patient understands and consents to the procedure. 15 cc of blood were withdrawn from the patient's right antecubital fossa by myself. The skin at the prior LP site was scrubbed with Betadine and draped in  sterile fashion. Skin anesthesia was carried out using 1% Lidocaine. An epidural approach was taken on the left at L2-3 using a 20 gauge spinal needle. Epidural positioning was confirmed by injecting a small amount of Isovue-300. There was no vascular communication. 15 cc of the patient's blood was slowly injected into the epidural space in this location. The procedure was well-tolerated and the patient was returned to the floor in good condition with orders to lie down for an additional day. FLUOROSCOPY TIME:  134.44 micro gray meter squared. 0 minutes 24 seconds. IMPRESSION: Lumbar epidural blood patch on the left at L2-3. Electronically Signed   By: Paulina Fusi M.D.   On: 05/12/2016 14:05   Consultations: - VIR for bloodpatch  Discharge Instructions: Discharge Instructions    Call MD for:  persistant dizziness or light-headedness    Complete by:  As directed    Call MD for:  persistant nausea and vomiting    Complete by:  As directed    Call MD for:  severe uncontrolled pain    Complete by:  As directed    Diet - low sodium heart  healthy    Complete by:  As directed    Discharge instructions    Complete by:  As directed    You received a blood-patch to stop the leak from your spinal column which should help resolve your headache. You should continue to follow up with your Neurologist.   Increase activity slowly    Complete by:  As directed       Signed: Carolynn CommentBryan Caylin Nass, MD 05/13/2016, 11:11 AM   Pager: [phone]

## 2016-05-13 NOTE — Progress Notes (Signed)
   Subjective: Currently, the patient is feeling okay and able to sit upright. HA much improved. Complains of some post-procedure back pain. Ready for DC.  Objective: Vital signs in last 24 hours: Vitals:   05/12/16 2000 05/13/16 0044 05/13/16 0432 05/13/16 1034  BP: 116/63 (!) 117/95 106/75 129/75  Pulse: (!) 114 (!) 113 (!) 116 (!) 108  Resp: 20 20 20 18   Temp: 98.3 F (36.8 C) 98.4 F (36.9 C) 98.3 F (36.8 C) 98.4 F (36.9 C)  TempSrc: Oral Oral Oral Oral  SpO2: 97% 97% 99% 98%  Weight:      Height:       Physical Exam: Physical Exam  Constitutional: She is oriented to person, place, and time. She appears well-developed. She is cooperative. No distress.  Cardiovascular: Normal rate, regular rhythm, normal heart sounds and normal pulses.  Exam reveals no gallop.   No murmur heard. Pulmonary/Chest: Effort normal and breath sounds normal. No respiratory distress. Breasts are symmetrical.  Abdominal: Soft. Bowel sounds are normal. There is no tenderness.  Musculoskeletal: She exhibits no edema.  Neurological: She is alert and oriented to person, place, and time.   Labs: CBC:  Recent Labs Lab 05/10/16 1603  WBC 10.5  HGB 12.6  HCT 38.2  MCV 90.1  PLT 321   Metabolic Panel:  Recent Labs Lab 05/10/16 1603  NA 137  K 3.9  CL 111  CO2 19*  GLUCOSE 83  BUN 17  CREATININE 0.97  CALCIUM 8.9  LABPROT 13.6  INR 1.04    Medications: Scheduled Medications: . amphetamine-dextroamphetamine  15 mg Oral BH-q7a  . buPROPion  300 mg Oral Daily  . enoxaparin (LOVENOX) injection  40 mg Subcutaneous Q24H  . pantoprazole  20 mg Oral Daily  . topiramate  100 mg Oral BID   PRN Medications: bisacodyl, butalbital-acetaminophen-caffeine, diphenhydrAMINE, lidocaine (PF), oxyCODONE-acetaminophen, promethazine, senna-docusate, sodium chloride, white petrolatum  Assessment/Plan: [name] is a 36 y.o. female with h/o migraines and pseudotumor who developed severe  postural HA after LP.  1) Post-LP HA: Blood patch yesterday. HA improved, but now with some residual back pain after procedure. - Fiorecet - Percet 5mg  q4h PRN pain  2) Pseudotumor cerebri: First diagnosed in 2011. H/o Diamox, but had significant SE of tongue soreness. Has been contemplating bariatric surgery for wt loss and definitive treatment. Recently re-established care with Neurology, underwent LP w/ 22cm H2O, therapeutically drained to ~10cm H2O. No vision changes w/o papilledema on exam.  3) Migraines: h/o migraines typically w/ phonophobia. No prominent aura. Has had limited relief with OTC meds such at Fiorecet, NSAIDS, Tylenol but had long h/o daily use. No d/c OTC meds for concern of medication overuse and recently initiated Topiramate by Neurology. - continue home Topiramate 100mg  BID  4) ADHD / Anxiety: Pt w/ h/o GAD and ADHD. Home meds: bupropion, buspirone, Adderall XR + Adderall, clonazepam. Had exacerbation of ADHD sx w/ Ativan. - continue bupropion, Adderall - hold benzos  Length of Stay: 2 day(s) Dispo: Anticipated discharge in approximately 1 day.  Carolynn CommentBryan Kameo Bains, MD Pager: [phone] (7AM-5PM) 05/13/2016, 11:07 AM

## 2016-05-13 NOTE — Progress Notes (Signed)
Patient is discharged from room 5C09 at this time. Alert and in stable condition. IV site d/c'd and instruction read to patient with understanding verbalized. Left unit via wheelchair with all belongings at side.  

## 2016-05-13 NOTE — Care Management Note (Signed)
Case Management Note  Patient Details  Name: [name] MRN: [id number] Date of Birth: November 09, 1980  Subjective/Objective:                    Action/Plan: Pt discharging home with self care. Pt with transportation home, PCP and insurance. No further needs per CM.   Expected Discharge Date:  05/13/16               Expected Discharge Plan:  Home/Self Care  In-House Referral:     Discharge planning Services     Post Acute Care Choice:    Choice offered to:     DME Arranged:    DME Agency:     HH Arranged:    HH Agency:     Status of Service:  Completed, signed off  If discussed at [employer] Length of Stay Meetings, dates discussed:    Additional Comments:  Kermit BaloKelli F Xaine Sansom, RN 05/13/2016, 12:09 PM

## 2016-05-15 ENCOUNTER — Telehealth: Payer: Self-pay | Admitting: Neurology

## 2016-05-15 NOTE — Telephone Encounter (Signed)
She is currently being treated for thrush by her PCP.  She has been taking Diflucan for three days now.  Dr. Terrace ArabiaYan has reviewed chart and does not feel this is related to her medications from us.  She is going to follow up with her PCP this week.

## 2016-05-15 NOTE — Telephone Encounter (Signed)
Pt called says she has blisters in her mouth and lips are raw. She said her mouth had been feeling sore last week getting worse over the weekend. Said she is being treated for thrush but she thinks it could be side effect to the topamax (she started it 05/06/16). Pt will be able to talk until 8:45 then after 2pm today she will be in bible study.

## 2016-05-27 ENCOUNTER — Other Ambulatory Visit: Payer: Self-pay | Admitting: Neurology

## 2016-05-27 ENCOUNTER — Telehealth: Payer: Self-pay | Admitting: Neurology

## 2016-05-27 NOTE — Telephone Encounter (Signed)
I returned calls from patient who stated that she had a severe migraine headache for the last 2 days. She had tried 2 tablets of Fioricet and Maxalt and sleeping without significant relief. She also felt nauseous and  stated she may vomit. I advised her to go to the nearest urgent care or ER to get injectable medicines to treat her migraine. She voiced understanding.

## 2016-06-10 ENCOUNTER — Telehealth: Payer: Self-pay | Admitting: Neurology

## 2016-06-10 MED ORDER — ZONISAMIDE 100 MG PO CAPS: 100.0000 mg | ORAL_CAPSULE | Freq: Two times a day (BID) | ORAL | 11 refills | Status: DC

## 2016-06-10 NOTE — Addendum Note (Signed)
Addended by: Lindell Spar C on: 06/10/2016 03:52 PM   Modules accepted: Orders

## 2016-06-10 NOTE — Telephone Encounter (Signed)
Patient called office in reference to Topamax stating numbness in her feet (bilateral) has gotten worse can not sleep at night waking her up out of a deep sleep.  It hurts really bad per patient goes up to ankle.  Please call

## 2016-06-10 NOTE — Telephone Encounter (Signed)
Patient reports progressing numbness/tingling in her mouth and feet since starting Topamax.  She does not wish to continue this medication.  She is aware that it is important for her to continue making an effort to lose weight.  Dr. Terrace Arabia has reviewed this concern.  She has instructed the patient to stop taking Topamax , BID.  She has provided her with a prescription of Zonegran , BID to start.  Patient is agreeable to med changes and verbalized understanding.

## 2016-06-12 ENCOUNTER — Other Ambulatory Visit: Payer: Self-pay | Admitting: *Deleted

## 2016-06-12 MED ORDER — ZONISAMIDE 100 MG PO CAPS: 100.0000 mg | ORAL_CAPSULE | Freq: Two times a day (BID) | ORAL | 11 refills | Status: DC

## 2016-06-12 NOTE — Telephone Encounter (Signed)
Patient called office in reference zonisamide (ZONEGRAN) 100 MG capsule.  Sent to incorrect pharmacy needs to be sent to [facility].

## 2016-06-12 NOTE — Telephone Encounter (Signed)
Called [facility] to void previously sent rx.  Resent to [facility].

## 2016-07-07 ENCOUNTER — Encounter (INDEPENDENT_AMBULATORY_CARE_PROVIDER_SITE_OTHER): Payer: Self-pay

## 2016-07-07 ENCOUNTER — Ambulatory Visit (INDEPENDENT_AMBULATORY_CARE_PROVIDER_SITE_OTHER): Payer: BLUE CROSS/BLUE SHIELD | Admitting: Neurology

## 2016-07-07 ENCOUNTER — Encounter: Payer: Self-pay | Admitting: Neurology

## 2016-07-07 VITALS — BP 127/77 | HR 106 | Ht 64.0 in | Wt 258.0 lb

## 2016-07-07 DIAGNOSIS — G932 Benign intracranial hypertension: Secondary | ICD-10-CM | POA: Diagnosis not present

## 2016-07-07 DIAGNOSIS — G43709 Chronic migraine without aura, not intractable, without status migrainosus: Secondary | ICD-10-CM

## 2016-07-07 DIAGNOSIS — IMO0002 Reserved for concepts with insufficient information to code with codable children: Secondary | ICD-10-CM

## 2016-07-07 MED ORDER — SUMATRIPTAN SUCCINATE 6 MG/0.5ML ~~LOC~~ SOLN: 6.0000 mg | SUBCUTANEOUS | 11 refills | Status: DC | PRN

## 2016-07-07 MED ORDER — PROMETHAZINE HCL 25 MG PO TABS: 25.0000 mg | ORAL_TABLET | Freq: Four times a day (QID) | ORAL | 6 refills | Status: DC | PRN

## 2016-07-07 MED ORDER — RIZATRIPTAN BENZOATE 10 MG PO TBDP: 10.0000 mg | ORAL_TABLET | ORAL | 11 refills | Status: DC | PRN

## 2016-07-07 NOTE — Progress Notes (Signed)
PATIENT: [name] DOB: 09-06-1980  Chief Complaint  Patient presents with  . Migraine/Pseudotumor Cerebri    She developed a severe headache after having her LP that required treatment with a blood patch.  She had intolerable side effects with Topamax.  She is doing better on Zonegran100mg , BID.     HISTORICAL  [name] is a 36 years old right-handed female, accompanied by her husband [name], seen in refer by her primary care physician nurse practitioner [name] for evaluation of pseudotumor cerebri, initial evaluation was on May 06 2016.  I saw her in 2011, She had a history of hypertension during pregnancy, postpartum depression, frequent headaches postpartum day, there was mild evidence of papillary edema on examination, she was referred for CT head without contrast that was normal in December 2011, lumbar puncture on February 06 2010, patient reported elevated open pressure 44 cm water, she later developed difficulty urination, low back pain, had MRI of lumbar on February 08 2010, that was normal.  I reviewed and summarized the referring note, reported a history of ADHD since age 76, only tolerate Adderall extended release, anxiety, currently taking Wellbutrin 300 mg daily, recently started on BuSpar 15 mg twice a day, Klonopin 1 mg twice a day as needed, also taking instant release Adderall 5 mg one tablet in the afternoon  He lost insurance has lost follow-up since 2011, today she came in complains of gradual worsening headaches since 2016, now she has daily left occipital headache, spreading forward to become left retro-orbital area severe pounding headache with associated light noise sensitivity, nauseous, she went to emergency room about 6 times over the past 12 months for headache treatment, she also had steady weight gain, complains of transient difficulty focusing with sudden body positional change. She denies tinnitus, no hearing loss.  UPDATE Jul 07 2016: She had a fluoroscopy guided lumbar puncture on May 07 2016, open pressure was 23 cm water, closing pressure was 12 cm water.  Post LP she developed severe positional related headache, require hospital admission, was treated with Morphine as needed, also received scheduled Percocet, Fioricet. She eventually had a blood patch on March twelfth 2018.  She could not tolerate Topamax, she complains of bilateral feet neuropathic pain, numbness tingling, also complains of alternating of the taste especially with soda, and her mouth feel raw, she could not tolerate it, she was put on Zonegran 100 mg twice a day since and of March 2018, she could not tolerate it better, she noticed improvement of her headaches.  Over the past 2 months, on average he has one or two headache each week, left-sided lateralized severe pounding headache with associated light noise sensitivity, nauseous, she is now taking Maxalt 10 mg, sometimes Fioricet as needed, this will usually take care of the headache within one hour.  she is planning on to have bariatric surgery soon. Have bariatric evaluation Jul 28 2016.  REVIEW OF SYSTEMS: Full 14 system review of systems performed and notable only for as above  ALLERGIES: Allergies  Allergen Reactions  . Naproxen Nausea And Vomiting and Other (See Comments)    cramping  . Topiramate Other (See Comments)    Numbness/tingling in mouth/feet  . Asa [Aspirin] Other (See Comments)    Stomach pain  . Macrobid [employer] Macro] Other (See Comments)    Stomach pain  . Penicillins Hives    Has patient had a PCN reaction causing immediate rash, facial/tongue/throat swelling, SOB or lightheadedness with hypotension:  No Has patient had a PCN reaction causing severe rash involving mucus membranes or skin necrosis: No Has patient had a PCN reaction that required hospitalization No Has patient had a PCN reaction occurring within the last 10 years: No If all of the above  answers are "NO", then may proceed with Cephalosporin use.  Lyman Bishop [Pseudoephedrine Hcl] Hives    HOME MEDICATIONS: Current Outpatient Prescriptions  Medication Sig Dispense Refill  . amphetamine-dextroamphetamine (ADDERALL XR) 15 MG 24 hr capsule Take 15 mg by mouth every morning.    Marland Kitchen amphetamine-dextroamphetamine (ADDERALL) 5 MG tablet Take 5 mg by mouth daily.  0  . buPROPion (WELLBUTRIN XL) 300 MG 24 hr tablet Take 300 mg by mouth daily.  4  . busPIRone (BUSPAR) 15 MG tablet Take 15 mg by mouth 2 (two) times daily.    . butalbital-acetaminophen-caffeine (FIORICET, ESGIC) 50-325-40 MG tablet TAKE 1 TABLET EVERY SIX HOURS AS NEEDED FOR HEADACHE. 15 tablet 0  . ondansetron (ZOFRAN ODT) 4 MG disintegrating tablet Take 1 tablet (4 mg total) by mouth every 8 (eight) hours as needed for nausea or vomiting. 20 tablet 11  . oxyCODONE-acetaminophen (PERCOCET/ROXICET) 5-325 MG tablet Take 1 tablet by mouth every 4 (four) hours as needed for moderate pain or severe pain. 15 tablet 0  . rizatriptan (MAXALT-MLT) 10 MG disintegrating tablet Take 1 tablet (10 mg total) by mouth as needed. May repeat in 2 hours if needed 15 tablet 6  . zonisamide (ZONEGRAN) 100 MG capsule Take 1 capsule (100 mg total) by mouth 2 (two) times daily. 60 capsule 11  . clonazePAM (KLONOPIN) 1 MG tablet Take 1 mg by mouth 2 (two) times daily as needed for anxiety.      No current facility-administered medications for this visit.     PAST MEDICAL HISTORY: Past Medical History:  Diagnosis Date  . ADHD (attention deficit hyperactivity disorder)   . ADHD, adult residual type   . Anxiety   . Depression   . Family history of adverse reaction to anesthesia    sister has PONV  . GERD (gastroesophageal reflux disease)   . Headache    otc med prn - last one 2 months ago  . 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 Pseudotumor cerebri     PAST SURGICAL HISTORY: Past Surgical History:  Procedure Laterality Date  .  ABDOMINAL HYSTERECTOMY    . CESAREAN SECTION     x 2  . CYSTOSCOPY N/A 06/19/2015   Procedure: CYSTOSCOPY;  Surgeon: Jaymes Graff, MD;  Location: [facility];  Service: Gynecology;  Laterality: N/A;  . IR GENERIC HISTORICAL  05/12/2016   IR FLUORO GUIDED NEEDLE PLC ASPIRATION/INJECTION LOC 05/12/2016 Paulina Fusi, MD [facility]  . LAPAROSCOPIC VAGINAL HYSTERECTOMY WITH SALPINGECTOMY Bilateral 06/19/2015   Procedure: LAPAROSCOPIC ASSISTED VAGINAL HYSTERECTOMY WITH SALPINGECTOMY;  Surgeon: Jaymes Graff, MD;  Location: [facility];  Service: Gynecology;  Laterality: Bilateral;  . TONSILLECTOMY      FAMILY HISTORY: Family History  Problem Relation Age of Onset  . Pneumonia Mother     Sepsis  . Heart attack Father     SOCIAL HISTORY:  Social History   Social History  . Marital status: Married    Spouse name: N/A  . Number of children: 2  . Years of education: cosmetology   Occupational History  . Homemaker    Social History Main Topics  . Smoking status: Never Smoker  . Smokeless tobacco: Never Used  . Alcohol use Yes  Comment: occasional liquor  . Drug use: No  . Sexual activity: Yes    Birth control/ protection: None     Comment: Husband had vasectomy   Other Topics Concern  . Not on file   Social History Narrative   Lives at home with her husband and children.   Right-handed.   3-4 cups caffeine per day.     PHYSICAL EXAM   Vitals:   07/07/16 1206  BP: 127/77  Pulse: (!) 106  Weight: 258 lb (117 kg)  Height: 5\' 4"  (1.626 m)    Not recorded      Body mass index is 44.29 kg/m.  PHYSICAL EXAMNIATION:  Gen: NAD, conversant, well nourised, obese, well groomed                     Cardiovascular: Regular rate rhythm, no peripheral edema, warm, nontender. Eyes: Conjunctivae clear without exudates or hemorrhage Neck: Supple, no carotid bruits. Pulmonary: Clear to auscultation bilaterally   NEUROLOGICAL EXAM:  MENTAL STATUS: Speech:    Speech is normal;  fluent and spontaneous with normal comprehension.  Cognition:     Orientation to time, place and person     Normal recent and remote memory     Normal Attention span and concentration     Normal Language, naming, repeating,spontaneous speech     Fund of knowledge   CRANIAL NERVES: CN II: Visual fields are full to confrontation. Fundoscopic exam Showed mild reactive at bilateral fundi, I was not able to appreciate venous pulsations. Pupils are round equal and briskly reactive to light. CN III, IV, VI: extraocular movement are normal. No ptosis. CN V: Facial sensation is intact to pinprick in all 3 divisions bilaterally. Corneal responses are intact.  CN VII: Face is symmetric with normal eye closure and smile. CN VIII: Hearing is normal to rubbing fingers CN IX, X: Palate elevates symmetrically. Phonation is normal. CN XI: Head turning and shoulder shrug are intact CN XII: Tongue is midline with normal movements and no atrophy.  MOTOR: There is no pronator drift of out-stretched arms. Muscle bulk and tone are normal. Muscle strength is normal.  REFLEXES: Reflexes are 2+ and symmetric at the biceps, triceps, knees, and ankles. Plantar responses are flexor.  SENSORY: Intact to light touch, pinprick, positional sensation and vibratory sensation are intact in fingers and toes.  COORDINATION: Rapid alternating movements and fine finger movements are intact. There is no dysmetria on finger-to-nose and heel-knee-shin.    GAIT/STANCE: Posture is normal. Gait is steady with normal steps, base, arm swing, and turning. Heel and toe walking are normal. Tandem gait is normal.  Romberg is absent.   DIAGNOSTIC DATA (LABS, IMAGING, TESTING) - I reviewed patient records, labs, notes, testing and imaging myself where available.   ASSESSMENT AND PLAN  [name] is a 36 y.o. female   Pseudotumor cerebri Frequent migraine headaches Morbid obesity  She could not tolerate Topamax due to  paresthesia,  Doing better with zonogram 100 mg twice a day,  Continue Maxalt, Phenergan as abortive treatment,  Imitrex injections as needed for more prolonged headaches  Levert FeinsteinYijun Zaiden Ludlum, M.D. Ph.D.  [facility] [address] [place], [place] [address] Phone: [phone] Fax:      [phone]

## 2016-10-02 NOTE — Progress Notes (Deleted)
[facility]  PATIENT: [name] DOB: 11-16-1980   REASON FOR VISIT: *** HISTORY FROM:    HISTORY OF PRESENT ILLNESS: [name] is a 36 years old right-handed female, accompanied by her husband [name], seen in refer by her primary care physician nurse practitioner Arvilla MeresKelly Phillips for evaluation of pseudotumor cerebri, initial evaluation was on May 06 2016.  I saw her in 2011, She had a history of hypertension during pregnancy, postpartum depression, frequent headaches postpartum day, there was mild evidence of papillary edema on examination, she was referred for CT head without contrast that was normal in December 2011, lumbar puncture on February 06 2010, patient reported elevated open pressure 44 cm water, she later developed difficulty urination, low back pain, had MRI of lumbar on February 08 2010, that was normal.  I reviewed and summarized the referring note, reported a history of ADHD since age 89, only tolerate Adderall extended release, anxiety, currently taking Wellbutrin 300 mg daily, recently started on BuSpar 15 mg twice a day, Klonopin 1 mg twice a day as needed, also taking instant release Adderall 5 mg one tablet in the afternoon  He lost insurance has lost follow-up since 2011, today she came in complains of gradual worsening headaches since 2016, now she has daily left occipital headache, spreading forward to become left retro-orbital area severe pounding headache with associated light noise sensitivity, nauseous, she went to emergency room about 6 times over the past 12 months for headache treatment, she also had steady weight gain, complains of transient difficulty focusing with sudden body positional change. She denies tinnitus, no hearing loss.  UPDATE Jul 07 2016: She had a fluoroscopy guided lumbar puncture on May 07 2016, open pressure was 23 cm water, closing pressure was 12 cm water.  Post LP she developed severe positional  related headache, require hospital admission, was treated with Morphine as needed, also received scheduled Percocet, Fioricet. She eventually had a blood patch on March twelfth 2018.  She could not tolerate Topamax, she complains of bilateral feet neuropathic pain, numbness tingling, also complains of alternating of the taste especially with soda, and her mouth feel raw, she could not tolerate it, she was put on Zonegran 100 mg twice a day since and of March 2018, she could not tolerate it better, she noticed improvement of her headaches.  Over the past 2 months, on average he has one or two headache each week, left-sided lateralized severe pounding headache with associated light noise sensitivity, nauseous, she is now taking Maxalt 10 mg, sometimes Fioricet as needed, this will usually take care of the headache within one hour.  she is planning on to have bariatric surgery soon. Have bariatric evaluation Jul 28 2016.  REVIEW OF SYSTEMS: Full 14 system review of systems performed and notable only for those listed, all others are neg:  Constitutional: neg  Cardiovascular: neg Ear/Nose/Throat: neg  Skin: neg Eyes: neg Respiratory: neg Gastroitestinal: neg  Hematology/Lymphatic: neg  Endocrine: neg Musculoskeletal:neg Allergy/Immunology: neg Neurological: neg Psychiatric: neg Sleep : neg   ALLERGIES: Allergies  Allergen Reactions  . Naproxen Nausea And Vomiting and Other (See Comments)    cramping  . Topiramate Other (See Comments)    Numbness/tingling in mouth/feet  . Asa [Aspirin] Other (See Comments)    Stomach pain  . Macrobid [employer][Nitrofurantoin [employer]oh[employer] Macro] Other (See Comments)    Stomach pain  . Penicillins Hives    Has patient had a PCN reaction causing immediate rash, facial/tongue/throat swelling, SOB  or lightheadedness with hypotension: No Has patient had a PCN reaction causing severe rash involving mucus membranes or skin necrosis: No Has patient had a PCN reaction  that required hospitalization No Has patient had a PCN reaction occurring within the last 10 years: No If all of the above answers are "NO", then may proceed with Cephalosporin use.  Lyman Bishop [Pseudoephedrine Hcl] Hives    HOME MEDICATIONS: Outpatient Medications Prior to Visit  Medication Sig Dispense Refill  . amphetamine-dextroamphetamine (ADDERALL XR) 15 MG 24 hr capsule Take 15 mg by mouth every morning.    Marland Kitchen amphetamine-dextroamphetamine (ADDERALL) 5 MG tablet Take 5 mg by mouth daily.  0  . buPROPion (WELLBUTRIN XL) 300 MG 24 hr tablet Take 300 mg by mouth daily.  4  . busPIRone (BUSPAR) 15 MG tablet Take 15 mg by mouth 2 (two) times daily.    . butalbital-acetaminophen-caffeine (FIORICET, ESGIC) 50-325-40 MG tablet TAKE 1 TABLET EVERY SIX HOURS AS NEEDED FOR HEADACHE. 15 tablet 0  . clonazePAM (KLONOPIN) 1 MG tablet Take 1 mg by mouth 2 (two) times daily as needed for anxiety.     Marland Kitchen oxyCODONE-acetaminophen (PERCOCET/ROXICET) 5-325 MG tablet Take 1 tablet by mouth every 4 (four) hours as needed for moderate pain or severe pain. 15 tablet 0  . promethazine (PHENERGAN) 25 MG tablet Take 1 tablet (25 mg total) by mouth every 6 (six) hours as needed for nausea or vomiting. 30 tablet 6  . rizatriptan (MAXALT-MLT) 10 MG disintegrating tablet Take 1 tablet (10 mg total) by mouth as needed. May repeat in 2 hours if needed 12 tablet 11  . SUMAtriptan (IMITREX) 6 MG/0.5ML SOLN injection Inject 0.5 mLs (6 mg total) into the skin every 2 (two) hours as needed for migraine or headache. May repeat in 2 hours if headache persists or recurs. 12 vial 11  . zonisamide (ZONEGRAN) 100 MG capsule Take 1 capsule (100 mg total) by mouth 2 (two) times daily. 60 capsule 11   No facility-administered medications prior to visit.     PAST MEDICAL HISTORY: Past Medical History:  Diagnosis Date  . ADHD (attention deficit hyperactivity disorder)   . ADHD, adult residual type   . Anxiety   . Depression   .  Family history of adverse reaction to anesthesia    sister has PONV  . GERD (gastroesophageal reflux disease)   . Headache    otc med prn - last one 2 months ago  . 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 Pseudotumor cerebri     PAST SURGICAL HISTORY: Past Surgical History:  Procedure Laterality Date  . ABDOMINAL HYSTERECTOMY    . CESAREAN SECTION     x 2  . CYSTOSCOPY N/A 06/19/2015   Procedure: CYSTOSCOPY;  Surgeon: Jaymes Graff, MD;  Location: [facility];  Service: Gynecology;  Laterality: N/A;  . IR GENERIC HISTORICAL  05/12/2016   IR FLUORO GUIDED NEEDLE PLC ASPIRATION/INJECTION LOC 05/12/2016 Paulina Fusi, MD [facility]  . LAPAROSCOPIC VAGINAL HYSTERECTOMY WITH SALPINGECTOMY Bilateral 06/19/2015   Procedure: LAPAROSCOPIC ASSISTED VAGINAL HYSTERECTOMY WITH SALPINGECTOMY;  Surgeon: Jaymes Graff, MD;  Location: [facility];  Service: Gynecology;  Laterality: Bilateral;  . TONSILLECTOMY      FAMILY HISTORY: Family History  Problem Relation Age of Onset  . Pneumonia Mother        Sepsis  . Heart attack Father     SOCIAL HISTORY: Social History   Social History  . Marital status: Married    Spouse name: N/A  .  Number of children: 2  . Years of education: cosmetology   Occupational History  . Homemaker    Social History Main Topics  . Smoking status: Never Smoker  . Smokeless tobacco: Never Used  . Alcohol use Yes     Comment: occasional liquor  . Drug use: No  . Sexual activity: Yes    Birth control/ protection: None     Comment: Husband had vasectomy   Other Topics Concern  . Not on file   Social History Narrative   Lives at home with her husband and children.   Right-handed.   3-4 cups caffeine per day.     PHYSICAL EXAM  There were no vitals filed for this visit. There is no height or weight on file to calculate BMI.  Generalized: Well developed, in no acute distress  Head: normocephalic and atraumatic,. Oropharynx benign  Neck: Supple, no carotid  bruits  Cardiac: Regular rate rhythm, no murmur  Musculoskeletal: No deformity   Neurological examination   Mentation: Alert oriented to time, place, history taking. Attention span and concentration appropriate. Recent and remote memory intact.  Follows all commands speech and language fluent.   Cranial nerve II-XII: Fundoscopic exam reveals sharp disc margins.Pupils were equal round reactive to light extraocular movements were full, visual field were full on confrontational test. Facial sensation and strength were normal. hearing was intact to finger rubbing bilaterally. Uvula tongue midline. head turning and shoulder shrug were normal and symmetric.Tongue protrusion into cheek strength was normal. Motor: normal bulk and tone, full strength in the BUE, BLE, fine finger movements normal, no pronator drift. No focal weakness Sensory: normal and symmetric to light touch, pinprick, and  Vibration, proprioception  Coordination: finger-nose-finger, heel-to-shin bilaterally, no dysmetria Reflexes: Brachioradialis 2/2, biceps 2/2, triceps 2/2, patellar 2/2, Achilles 2/2, plantar responses were flexor bilaterally. Gait and Station: Rising up from seated position without assistance, normal stance,  moderate stride, good arm swing, smooth turning, able to perform tiptoe, and heel walking without difficulty. Tandem gait is steady  DIAGNOSTIC DATA (LABS, IMAGING, TESTING) - I reviewed patient records, labs, notes, testing and imaging myself where available.  Lab Results  Component Value Date   WBC 10.5 05/10/2016   HGB 12.6 05/10/2016   HCT 38.2 05/10/2016   MCV 90.1 05/10/2016   PLT 321 05/10/2016      Component Value Date/Time   NA 137 05/10/2016 1603   K 3.9 05/10/2016 1603   CL 111 05/10/2016 1603   CO2 19 (L) 05/10/2016 1603   GLUCOSE 83 05/10/2016 1603   BUN 17 05/10/2016 1603   CREATININE 0.97 05/10/2016 1603   CALCIUM 8.9 05/10/2016 1603   PROT 7.6 12/11/2010 2035   ALBUMIN 4.0  12/11/2010 2035   AST 21 12/11/2010 2035   ALT 22 12/11/2010 2035   ALKPHOS 86 12/11/2010 2035   BILITOT 0.2 (L) 12/11/2010 2035   GFRNONAA >60 05/10/2016 1603   GFRAA >60 05/10/2016 1603    ASSESSMENT AND PLAN  36 y.o. year old female  has a past medical history of ADHD (attention deficit hyperactivity disorder); ADHD, adult residual type; Anxiety; Depression; Family history of adverse reaction to anesthesia; GERD (gastroesophageal reflux disease); Headache; Post lumbar puncture headache (05/10/2016); and Pseudotumor cerebri. here with *** Pseudotumor cerebri Frequent migraine headaches Morbid obesity             She could not tolerate Topamax due to paresthesia,             Doing better with  zonogram 100 mg twice a day,             Continue Maxalt, Phenergan as abortive treatment,             Imitrex injections as needed for more prolonged headaches   Nilda RiggsNancy Carolyn Perley Arthurs, [facility] HospitalGNP, [facility] HospitalBC, APRN  [facility] [address], [address] [place], [place] [address] [phone]

## 2016-10-03 ENCOUNTER — Ambulatory Visit: Payer: BLUE CROSS/BLUE SHIELD | Admitting: Nurse Practitioner

## 2016-10-06 ENCOUNTER — Encounter: Payer: Self-pay | Admitting: Nurse Practitioner

## 2016-10-15 ENCOUNTER — Telehealth: Payer: Self-pay | Admitting: Nurse Practitioner

## 2016-10-15 DIAGNOSIS — Z0289 Encounter for other administrative examinations: Secondary | ICD-10-CM

## 2016-10-15 NOTE — Telephone Encounter (Signed)
Dr. Terrace ArabiaYan had a cancellation on 10/29/16 at 2:00pm.  Pt accepted that appointment.  She will arrived to the office early for check-in.

## 2016-10-15 NOTE — Telephone Encounter (Signed)
Pt has called to be rescheduled and is needing to be seen before 8-30 due to a pre op. Appointment for weight loss. Pt agreed to appointment date in Sept but only to be put on a weight list and to request RN see if there is any way for her to be seen by 8-30, pt states she is willing to wait and is available any day  Any time between today and 8-29.  Please call

## 2016-10-29 ENCOUNTER — Ambulatory Visit (INDEPENDENT_AMBULATORY_CARE_PROVIDER_SITE_OTHER): Payer: BLUE CROSS/BLUE SHIELD | Admitting: Neurology

## 2016-10-29 ENCOUNTER — Encounter: Payer: Self-pay | Admitting: Neurology

## 2016-10-29 VITALS — BP 124/76 | HR 102 | Ht 64.0 in | Wt 252.0 lb

## 2016-10-29 DIAGNOSIS — G932 Benign intracranial hypertension: Secondary | ICD-10-CM

## 2016-10-29 DIAGNOSIS — IMO0002 Reserved for concepts with insufficient information to code with codable children: Secondary | ICD-10-CM

## 2016-10-29 DIAGNOSIS — G43709 Chronic migraine without aura, not intractable, without status migrainosus: Secondary | ICD-10-CM

## 2016-10-29 NOTE — Progress Notes (Signed)
PATIENT: [name] DOB: 1980-03-10  Chief Complaint  Patient presents with  . Migraine/Pseudotumor Cerebri    Feels her migraines are well controlled when she takes Zonegran on a regular basis.  Sumatriptan injections resolve her pain. Rizatriptan does not help.  She is seeking clearance for bariatic surgery.     HISTORICAL  [name] is a 36 years old right-handed female, accompanied by her husband [name], seen in refer by her primary care physician nurse practitioner [name] for evaluation of pseudotumor cerebri, initial evaluation was on May 06 2016.  I saw her in 2011, She had a history of hypertension during pregnancy, postpartum depression, frequent headaches postpartum day, there was mild evidence of papillary edema on examination, she was referred for CT head without contrast that was normal in December 2011, lumbar puncture on February 06 2010, patient reported elevated open pressure 44 cm water, she later developed difficulty urination, low back pain, had MRI of lumbar on February 08 2010, that was normal.  I reviewed and summarized the referring note, reported a history of ADHD since age 53, only tolerate Adderall extended release, anxiety, currently taking Wellbutrin 300 mg daily, recently started on BuSpar 15 mg twice a day, Klonopin 1 mg twice a day as needed, also taking instant release Adderall 5 mg one tablet in the afternoon  She  lost insurance has lost follow-up since 2011, today she came in complains of gradual worsening headaches since 2016, now she has daily left occipital headache, spreading forward to become left retro-orbital area severe pounding headache with associated light noise sensitivity, nauseous, she went to emergency room about 6 times over the past 12 months for headache treatment, she also had steady weight gain, complains of transient difficulty focusing with sudden body positional change. She denies tinnitus, no hearing  loss.  UPDATE Jul 07 2016: She had a fluoroscopy guided lumbar puncture on May 07 2016, open pressure was 23 cm water, closing pressure was 12 cm water.  Post LP she developed severe positional related headache, require hospital admission, was treated with Morphine as needed, also received scheduled Percocet, Fioricet. She eventually had a blood patch on March twelfth 2018.  She could not tolerate Topamax, she complains of bilateral feet neuropathic pain, numbness tingling, also complains of alternating of the taste especially with soda, and her mouth feel raw, she could not tolerate it, she was put on Zonegran 100 mg twice a day since and of March 2018, she could not tolerate it better, she noticed improvement of her headaches.  Over the past 2 months, on average he has one or two headache each week, left-sided lateralized severe pounding headache with associated light noise sensitivity, nauseous, she is now taking Maxalt 10 mg, sometimes Fioricet as needed, this will usually take care of the headache within one hour.  she is planning on to have bariatric surgery soon. have bariatric evaluation Jul 28 2016.  UPDATE October 29 2016:  Lumbar puncture in March 2018 showed open pressure of 25 cm water, Laboratory evaluation showed normal BMP, INR, CBC, negative HIV,  She tolerated Zonegran well, if she is compliance with it, her migraine would be under better control.  She is planning on to have bariatric surgery. Imitrex injection as needed works well for her, she used average 6 injection every month.   She went to nutritional consultation, has lost 8 pounds over past 2 weeks,   REVIEW OF SYSTEMS: Full 14 system review of systems performed and  notable only for as above  ALLERGIES: Allergies  Allergen Reactions  . Naproxen Nausea And Vomiting and Other (See Comments)    cramping  . Topiramate Other (See Comments)    Numbness/tingling in mouth/feet  . Asa [Aspirin] Other (See Comments)     Stomach pain  . Macrobid [employer] Macro] Other (See Comments)    Stomach pain  . Penicillins Hives    Has patient had a PCN reaction causing immediate rash, facial/tongue/throat swelling, SOB or lightheadedness with hypotension: No Has patient had a PCN reaction causing severe rash involving mucus membranes or skin necrosis: No Has patient had a PCN reaction that required hospitalization No Has patient had a PCN reaction occurring within the last 10 years: No If all of the above answers are "NO", then may proceed with Cephalosporin use.  [name] [Pseudoephedrine Hcl] Hives    HOME MEDICATIONS: Current Outpatient Prescriptions  Medication Sig Dispense Refill  . amphetamine-dextroamphetamine (ADDERALL XR) 15 MG 24 hr capsule Take 15 mg by mouth every morning.    Marland Kitchen amphetamine-dextroamphetamine (ADDERALL) 5 MG tablet Take 5 mg by mouth daily.  0  . buPROPion (WELLBUTRIN XL) 300 MG 24 hr tablet Take 300 mg by mouth daily.  4  . busPIRone (BUSPAR) 15 MG tablet Take 15 mg by mouth 2 (two) times daily.    . promethazine (PHENERGAN) 25 MG tablet Take 1 tablet (25 mg total) by mouth every 6 (six) hours as needed for nausea or vomiting. 30 tablet 6  . rizatriptan (MAXALT-MLT) 10 MG disintegrating tablet Take 1 tablet (10 mg total) by mouth as needed. May repeat in 2 hours if needed 12 tablet 11  . SUMAtriptan (IMITREX) 6 MG/0.5ML SOLN injection Inject 0.5 mLs (6 mg total) into the skin every 2 (two) hours as needed for migraine or headache. May repeat in 2 hours if headache persists or recurs. 12 vial 11  . zonisamide (ZONEGRAN) 100 MG capsule Take 1 capsule (100 mg total) by mouth 2 (two) times daily. 60 capsule 11  . clonazePAM (KLONOPIN) 1 MG tablet Take 1 mg by mouth 2 (two) times daily as needed for anxiety.      No current facility-administered medications for this visit.     PAST MEDICAL HISTORY: Past Medical History:  Diagnosis Date  . ADHD (attention deficit  hyperactivity disorder)   . ADHD, adult residual type   . Anxiety   . Depression   . Family history of adverse reaction to anesthesia    sister has PONV  . GERD (gastroesophageal reflux disease)   . Headache    otc med prn - last one 2 months ago  . 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 Pseudotumor cerebri     PAST SURGICAL HISTORY: Past Surgical History:  Procedure Laterality Date  . ABDOMINAL HYSTERECTOMY    . CESAREAN SECTION     x 2  . CYSTOSCOPY N/A 06/19/2015   Procedure: CYSTOSCOPY;  Surgeon: Jaymes Graff, MD;  Location: [facility];  Service: Gynecology;  Laterality: N/A;  . IR GENERIC HISTORICAL  05/12/2016   IR FLUORO GUIDED NEEDLE PLC ASPIRATION/INJECTION LOC 05/12/2016 Paulina Fusi, MD [facility]  . LAPAROSCOPIC VAGINAL HYSTERECTOMY WITH SALPINGECTOMY Bilateral 06/19/2015   Procedure: LAPAROSCOPIC ASSISTED VAGINAL HYSTERECTOMY WITH SALPINGECTOMY;  Surgeon: Jaymes Graff, MD;  Location: [facility];  Service: Gynecology;  Laterality: Bilateral;  . TONSILLECTOMY      FAMILY HISTORY: Family History  Problem Relation Age of Onset  . Pneumonia Mother  Sepsis  . Heart attack Father     SOCIAL HISTORY:  Social History   Social History  . Marital status: Married    Spouse name: N/A  . Number of children: 2  . Years of education: cosmetology   Occupational History  . Homemaker    Social History Main Topics  . Smoking status: Never Smoker  . Smokeless tobacco: Never Used  . Alcohol use Yes     Comment: occasional liquor  . Drug use: No  . Sexual activity: Yes    Birth control/ protection: None     Comment: Husband had vasectomy   Other Topics Concern  . Not on file   Social History Narrative   Lives at home with her husband and children.   Right-handed.   3-4 cups caffeine per day.     PHYSICAL EXAM   Vitals:   10/29/16 1401  BP: 124/76  Pulse: (!) 102  Weight: 252 lb (114.3 kg)  Height: 5\' 4"  (1.626 m)    Not recorded      Body mass  index is 43.26 kg/m.  PHYSICAL EXAMNIATION:  Gen: NAD, conversant, well nourised, obese, well groomed                     Cardiovascular: Regular rate rhythm, no peripheral edema, warm, nontender. Eyes: Conjunctivae clear without exudates or hemorrhage Neck: Supple, no carotid bruits. Pulmonary: Clear to auscultation bilaterally   NEUROLOGICAL EXAM:  MENTAL STATUS: Speech:    Speech is normal; fluent and spontaneous with normal comprehension.  Cognition:     Orientation to time, place and person     Normal recent and remote memory     Normal Attention span and concentration     Normal Language, naming, repeating,spontaneous speech     Fund of knowledge   CRANIAL NERVES: CN II: Visual fields are full to confrontation. Fundoscopic exam Showed mild reactive at bilateral fundi, I was not able to appreciate venous pulsations. Pupils are round equal and briskly reactive to light. CN III, IV, VI: extraocular movement are normal. No ptosis. CN V: Facial sensation is intact to pinprick in all 3 divisions bilaterally. Corneal responses are intact.  CN VII: Face is symmetric with normal eye closure and smile. CN VIII: Hearing is normal to rubbing fingers CN IX, X: Palate elevates symmetrically. Phonation is normal. CN XI: Head turning and shoulder shrug are intact CN XII: Tongue is midline with normal movements and no atrophy.  MOTOR: There is no pronator drift of out-stretched arms. Muscle bulk and tone are normal. Muscle strength is normal.  REFLEXES: Reflexes are 2+ and symmetric at the biceps, triceps, knees, and ankles. Plantar responses are flexor.  SENSORY: Intact to light touch, pinprick, positional sensation and vibratory sensation are intact in fingers and toes.  COORDINATION: Rapid alternating movements and fine finger movements are intact. There is no dysmetria on finger-to-nose and heel-knee-shin.    GAIT/STANCE: Posture is normal. Gait is steady with normal steps,  base, arm swing, and turning. Heel and toe walking are normal. Tandem gait is normal.  Romberg is absent.   DIAGNOSTIC DATA (LABS, IMAGING, TESTING) - I reviewed patient records, labs, notes, testing and imaging myself where available.   ASSESSMENT AND PLAN  [name] is a 36 y.o. female   Pseudotumor cerebri Frequent migraine headaches Morbid obesity  She could not tolerate Topamax due to paresthesia,  Doing better with zonogram 100 mg twice a day,  Maxalt did not  help her migraine anymore  Imitrex injections as needed for more prolonged severe migraine headaches  Levert FeinsteinYijun Kayden Hutmacher, M.D. Ph.D.  [facility] [address] [place], [place] [address] Phone: [phone] Fax:      [phone]

## 2016-11-27 ENCOUNTER — Ambulatory Visit: Payer: BLUE CROSS/BLUE SHIELD | Admitting: Nurse Practitioner

## 2017-01-15 ENCOUNTER — Other Ambulatory Visit: Payer: Self-pay | Admitting: Neurology

## 2017-03-09 DIAGNOSIS — M545 Low back pain, unspecified: Secondary | ICD-10-CM | POA: Insufficient documentation
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
## 2017-03-10 ENCOUNTER — Encounter: Payer: Self-pay | Admitting: Nurse Practitioner

## 2017-03-10 ENCOUNTER — Ambulatory Visit: Payer: BLUE CROSS/BLUE SHIELD | Admitting: Nurse Practitioner

## 2017-03-10 ENCOUNTER — Encounter (INDEPENDENT_AMBULATORY_CARE_PROVIDER_SITE_OTHER): Payer: Self-pay

## 2017-03-10 VITALS — BP 120/79 | HR 81 | Wt 212.0 lb

## 2017-03-10 DIAGNOSIS — R519 Headache, unspecified: Secondary | ICD-10-CM | POA: Insufficient documentation

## 2017-03-10 DIAGNOSIS — G932 Benign intracranial hypertension: Secondary | ICD-10-CM | POA: Diagnosis not present

## 2017-03-10 DIAGNOSIS — Z6841 Body Mass Index (BMI) 40.0 and over, adult: Secondary | ICD-10-CM | POA: Diagnosis not present

## 2017-03-10 DIAGNOSIS — R51 Headache: Secondary | ICD-10-CM

## 2017-03-10 MED ORDER — RIZATRIPTAN BENZOATE 10 MG PO TBDP: 10.0000 mg | ORAL_TABLET | ORAL | 6 refills | Status: DC | PRN

## 2017-03-10 NOTE — Patient Instructions (Signed)
Continue Maxalt acutely will refill May use Imitrex for severe headaches Congratulations on weight loss after surgery Follow-up in 8 months

## 2017-03-11 NOTE — Progress Notes (Signed)
I have reviewed and agreed above plan. 

## 2017-05-04 ENCOUNTER — Other Ambulatory Visit: Payer: Self-pay | Admitting: Neurology

## 2017-06-05 DIAGNOSIS — G894 Chronic pain syndrome: Secondary | ICD-10-CM | POA: Insufficient documentation

## 2017-06-12 ENCOUNTER — Other Ambulatory Visit: Payer: Self-pay | Admitting: Neurology

## 2017-10-16 NOTE — Pre-Procedure Instructions (Signed)
Margaret FridayWhitney J Ferguson  10/16/2017      [facility] [place] - [place] [place] [address] Phone: [phone] Fax: [phone]    Your procedure is scheduled on 10/28/17.  [facility] to [facility] Admitting at 8 A.M.  Call this number if you have problems the morning of surgery:  [phone]   Remember:  Do not eat or drink after midnight.  Y   Take these medicines the morning of surgery with A SIP OF WATER ---adderall,prozac,hydrocodone    Do not wear jewelry, make-up or nail polish.  Do not wear lotions, powders, or perfumes, or deodorant.  Do not shave 48 hours prior to surgery.  Men may shave face and neck.  Do not bring valuables to the hospital.  [facility] is not responsible for any belongings or valuables.  Contacts, dentures or bridgework may not be worn into surgery.  Leave your suitcase in the car.  After surgery it may be brought to your room.  For patients admitted to the hospital, discharge time will be determined by your treatment team.  Patients discharged the day of surgery will not be allowed to drive home.   Name and phone number of your driver:   Do not take any aspirin,anti-inflammatories,vitamins,or herbal supplements 5-7 days prior to surgery. Special instructions:   - Preparing for Surgery  Before surgery, you can play an important role.  Because skin is not sterile, your skin needs to be as free of germs as possible.  You can reduce the number of germs on you skin by washing with CHG (chlorahexidine gluconate) soap before surgery.  CHG is an antiseptic cleaner which kills germs and bonds with the skin to continue killing germs even after washing.  Oral Hygiene is also important in reducing the risk of infection.  Remember to brush your teeth with your regular toothpaste the morning of surgery.  Please DO NOT use if you have an allergy to CHG or antibacterial soaps.  If  your skin becomes reddened/irritated stop using the CHG and inform your nurse when you arrive at Short Stay.  Do not shave (including legs and underarms) for at least 48 hours prior to the first CHG shower.  You may shave your face.  Please follow these instructions carefully:   1.  Shower with CHG Soap the night before surgery and the morning of Surgery.  2.  If you choose to wash your hair, wash your hair first as usual with your normal shampoo.  3.  After you shampoo, rinse your hair and body thoroughly to remove the shampoo. 4.  Use CHG as you would any other liquid soap.  You can apply chg directly to the skin and wash gently with a      scrungie or washcloth.           5.  Apply the CHG Soap to your body ONLY FROM THE NECK DOWN.   Do not use on open wounds or open sores. Avoid contact with your eyes, ears, mouth and genitals (private parts).  Wash genitals (private parts) with your normal soap.  6.  Wash thoroughly, paying special attention to the area where your surgery will be performed.  7.  Thoroughly rinse your body with warm water from the neck down.  8.  DO NOT shower/wash with your normal soap after using and rinsing off the CHG Soap.  9.  Pat yourself dry with  a clean towel.            10.  Wear clean pajamas.            11.  Place clean sheets on your bed the night of your first shower and do not sleep with pets.  Day of Surgery  Do not apply any lotions/deoderants the morning of surgery.   Please wear clean clothes to the hospital/surgery center. Remember to brush your teeth with toothpaste.    Please read over the following fact sheets that you were given.

## 2017-10-19 ENCOUNTER — Encounter (HOSPITAL_COMMUNITY): Payer: Self-pay

## 2017-10-19 ENCOUNTER — Ambulatory Visit (HOSPITAL_COMMUNITY)
Admission: RE | Admit: 2017-10-19 | Discharge: 2017-10-19 | Disposition: A | Discharge status: Home / Self Care | Payer: BLUE CROSS/BLUE SHIELD | Source: Ambulatory Visit | Attending: Orthopedic Surgery | Admitting: Orthopedic Surgery

## 2017-10-19 DIAGNOSIS — Z01812 Encounter for preprocedural laboratory examination: Secondary | ICD-10-CM | POA: Insufficient documentation

## 2017-10-19 LAB — BASIC METABOLIC PANEL
Anion gap: 8 (ref 5–15)
BUN: 18 mg/dL (ref 6–20)
CO2: 24 mmol/L (ref 22–32)
Calcium: 9.1 mg/dL (ref 8.9–10.3)
Chloride: 106 mmol/L (ref 98–111)
Creatinine, Ser: 0.73 mg/dL (ref 0.44–1.00)
Glucose, Bld: 80 mg/dL (ref 70–99)
POTASSIUM: 4.7 mmol/L (ref 3.5–5.1)
SODIUM: 138 mmol/L (ref 135–145)

## 2017-10-19 LAB — CBC
HCT: 41.4 % (ref 36.0–46.0)
HEMOGLOBIN: 13.2 g/dL (ref 12.0–15.0)
MCH: 32 pg (ref 26.0–34.0)
MCHC: 31.9 g/dL (ref 30.0–36.0)
MCV: 100.2 fL — ABNORMAL HIGH (ref 78.0–100.0)
PLATELETS: 296 10*3/uL (ref 150–400)
RBC: 4.13 MIL/uL (ref 3.87–5.11)
RDW: 12.5 % (ref 11.5–15.5)
WBC: 7.2 10*3/uL (ref 4.0–10.5)

## 2017-10-19 LAB — SURGICAL PCR SCREEN
MRSA, PCR: NEGATIVE
STAPHYLOCOCCUS AUREUS: POSITIVE — AB

## 2017-10-19 NOTE — Progress Notes (Signed)
Mupirocin Ointment Rx called into [facility] for positive Staph. Pt notified of results and need to pick up Rx. She voiced understanding.

## 2017-10-20 NOTE — H&P (Addendum)
Patient ID: [name] MRN: [id number] DOB/AGE: [birth year] 37 y.o.  Admit date: (Not on file)  Admission Diagnoses:  SI joint dysfunction  HPI: The patient is here today for a pre-operative History and Physical. They are scheduled for   left SI joint fusion on 10-28-17   with Dr[facility] Shon [name].  Pt reports hx ogf good health.  Past Medical History: Past Medical History:  Diagnosis Date  . ADHD (attention deficit hyperactivity disorder)   . ADHD, adult residual type   . Anxiety   . Depression   . Family history of adverse reaction to anesthesia    sister has PONV  . GERD (gastroesophageal reflux disease)   . Headache    otc med prn - last one 2 months ago  . 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 Pseudotumor cerebri     Surgical History: Past Surgical History:  Procedure Laterality Date  . ABDOMINAL HYSTERECTOMY    . CESAREAN SECTION     x 2  . CYSTOSCOPY N/A 06/19/2015   Procedure: CYSTOSCOPY;  Surgeon: Margaret GraffNaima Dillard, MD;  Location: [name];  Service: Gynecology;  Laterality: N/A;  . GASTRECTOMY     sleeve, [name]  . IR GENERIC HISTORICAL  05/12/2016   IR FLUORO GUIDED NEEDLE PLC ASPIRATION/INJECTION LOC 05/12/2016 Margaret FusiMark Shogry, MD [name]  . LAPAROSCOPIC VAGINAL HYSTERECTOMY WITH SALPINGECTOMY Bilateral 06/19/2015   Procedure: LAPAROSCOPIC ASSISTED VAGINAL HYSTERECTOMY WITH SALPINGECTOMY;  Surgeon: Margaret GraffNaima Dillard, MD;  Location: [name];  Service: Gynecology;  Laterality: Bilateral;  . lumbar pucture     x2  . TONSILLECTOMY      Family History: Family History  Problem Relation Age of Onset  . Pneumonia Mother        Sepsis  . Heart attack Father     Social History: Social History   Socioeconomic History  . Marital status: Married    Spouse name: Not on file  . Number of children: 2  . Years of education: cosmetology  . Highest education level: Not on file  Occupational History  . Occupation: Futures traderHomemaker  Social  Needs  . Financial resource strain: Not on file  . Food insecurity:    Worry: Not on file    Inability: Not on file  . Transportation needs:    Medical: Not on file    Non-medical: Not on file  Tobacco Use  . Smoking status: Never Smoker  . Smokeless tobacco: Never Used  Substance and Sexual Activity  . Alcohol use: Yes    Comment: occasional liquor  . Drug use: No  . Sexual activity: Yes    Birth control/protection: None    Comment: [name] had vasectomy  Lifestyle  . Physical activity:    Days per week: Not on file    Minutes per session: Not on file  . Stress: Not on file  Relationships  . Social connections:    Talks on phone: Not on file    Gets together: Not on file    Attends religious service: Not on file    Active member of club or organization: Not on file    Attends meetings of clubs or organizations: Not on file    Relationship status: Not on file  . Intimate partner violence:    Fear of current or ex partner: Not on file    Emotionally abused: Not on file    Physically abused: Not on file    Forced sexual activity: Not on file  Other Topics Concern  . Not on file  Social History Narrative   Lives at home with her [name] and children.   Right-handed.   3-4 cups caffeine per day.    Allergies: Naproxen; Buspar [buspirone]; Lorazepam; Topiramate; Asa [aspirin]; Macrobid [nitrofurantoin monohyd macro]; Penicillins; and Sudafed [pseudoephedrine hcl]  Medications: I have reviewed the patient's current medications.  Vital Signs: No data found.  Radiology: No results found.  Labs: Recent Labs    10/19/17 1538  WBC 7.2  RBC 4.13  HCT 41.4  PLT 296   Recent Labs    10/19/17 1538  NA 138  K 4.7  CL 106  CO2 24  BUN 18  CREATININE 0.73  GLUCOSE 80  CALCIUM 9.1   No results for input(s): LABPT, INR in the last 72 hours.  Review of Systems: ROS  Physical Exam: There is no height or weight on file to calculate BMI.  Physical Exam    Constitutional: She is oriented to person, place, and time. She appears well-developed and well-nourished.  HENT:  Head: Normocephalic.  Cardiovascular: Normal rate and regular rhythm.  Respiratory: Effort normal and breath sounds normal.  GI: Soft. Bowel sounds are normal.  Neurological: She is alert and oriented to person, place, and time.  Skin: Skin is warm and dry.  Psychiatric: She has a normal mood and affect. Her behavior is normal. Judgment and thought content normal.   Significant left SI joint pain with direct palpation.  Positive Gleason's test, positive Patrick's test.  Mild low back pain in the midline with palpation and range of motion. Neuro: Negative nerve root tension signs.  No focal motor or sensory deficits in the left lower extremity. Vascular: Lower extremity peripheral pulses are 2+ and symmetrical.  Compartments are soft and nontender  Imaging studies: Patient recently had second SI injection completed on 07/18/17.  Again she had 80-90% improvement in her overall pain, but more importantly she notes significant improvement in her quality of life.  She was able to mow the lawn and be much more active around the house following that injection.  Assessment and Plan: Risks and benefits of surgery were discussed with the patient. These include: Infection, bleeding, death, stroke, paralysis, ongoing or worse pain, need for additional surgery, nonunion, mal-position of the screws.  Goals of surgery: Reduced (not eliminated) pain, and improved quality of life.  [name], PAC for Margaret Lickahari Francisco Ostrovsky, MD [name] [phone]  Patient's clinical exam is unchanged from her last office visit from 10/20/2017.  She continues to have severe left gluteal pain.  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  This is been confirmed with diagnostic injections.  Surgical plan is move forward with a SI joint fusion.  I have gone over the risks and  benefits and alternatives with the patient and her family and all their questions were addressed.  Patient will remain nonweightbearing with crutches postoperatively.

## 2017-10-25 ENCOUNTER — Encounter (HOSPITAL_COMMUNITY): Payer: Self-pay

## 2017-10-25 ENCOUNTER — Emergency Department (HOSPITAL_COMMUNITY)
Admission: EM | Admit: 2017-10-25 | Discharge: 2017-10-25 | Disposition: A | Discharge status: Home / Self Care | Payer: BLUE CROSS/BLUE SHIELD | Attending: Emergency Medicine | Admitting: Emergency Medicine

## 2017-10-25 ENCOUNTER — Other Ambulatory Visit: Payer: Self-pay

## 2017-10-25 DIAGNOSIS — F419 Anxiety disorder, unspecified: Secondary | ICD-10-CM

## 2017-10-25 DIAGNOSIS — Z79899 Other long term (current) drug therapy: Secondary | ICD-10-CM | POA: Diagnosis not present

## 2017-10-25 DIAGNOSIS — R419 Unspecified symptoms and signs involving cognitive functions and awareness: Secondary | ICD-10-CM | POA: Diagnosis present

## 2017-10-25 DIAGNOSIS — G43009 Migraine without aura, not intractable, without status migrainosus: Secondary | ICD-10-CM

## 2017-10-25 DIAGNOSIS — F909 Attention-deficit hyperactivity disorder, unspecified type: Secondary | ICD-10-CM | POA: Diagnosis not present

## 2017-10-25 MED ORDER — KETOROLAC TROMETHAMINE 30 MG/ML IJ SOLN
30.0000 mg | Freq: Once | INTRAMUSCULAR | Status: AC
  Administered 2017-10-25: 30 mg via INTRAVENOUS
  Filled 2017-10-25: qty 1

## 2017-10-25 MED ORDER — HYDROXYZINE HCL 50 MG/ML IM SOLN
50.0000 mg | Freq: Once | INTRAMUSCULAR | Status: AC
  Administered 2017-10-25: 50 mg via INTRAMUSCULAR
  Filled 2017-10-25: qty 1

## 2017-10-25 MED ORDER — SODIUM CHLORIDE 0.9 % IV BOLUS
1000.0000 mL | Freq: Once | INTRAVENOUS | Status: AC
  Administered 2017-10-25: 1000 mL via INTRAVENOUS

## 2017-10-25 MED ORDER — METOCLOPRAMIDE HCL 5 MG/ML IJ SOLN
10.0000 mg | Freq: Once | INTRAMUSCULAR | Status: AC
  Administered 2017-10-25: 10 mg via INTRAVENOUS
  Filled 2017-10-25: qty 2

## 2017-10-25 MED ORDER — HYDROXYZINE HCL 25 MG PO TABS: 25.0000 mg | ORAL_TABLET | Freq: Four times a day (QID) | ORAL | 0 refills | Status: DC | PRN

## 2017-10-25 MED ORDER — DIPHENHYDRAMINE HCL 50 MG/ML IJ SOLN
25.0000 mg | Freq: Once | INTRAMUSCULAR | Status: AC
  Administered 2017-10-25: 25 mg via INTRAVENOUS
  Filled 2017-10-25: qty 1

## 2017-10-25 NOTE — ED Provider Notes (Signed)
[facility]-EMERGENCY DEPT Provider Note   CSN: [id number] Arrival date & time: 10/25/17  0152     History   Chief Complaint Chief Complaint  Patient presents with  . Anxiety    HPI [name] is a 37 y.o. female.  The history is provided by the patient.  She has history attention deficit disorder and pseudotumor cerebri and is scheduled for sacroiliac joint fusion surgery next week.  She has developed extreme anxiety today regarding the upcoming procedure.  She took a dose of clonazepam without any relief.  She also is complaining of a headache typical of her migraines.  She denies visual disturbance, nausea, vomiting.  Past Medical History:  Diagnosis Date  . ADHD (attention deficit hyperactivity disorder)   . ADHD, adult residual type   . Anxiety   . Depression   . Family history of adverse reaction to anesthesia    sister has PONV  . GERD (gastroesophageal reflux disease)   . Headache    otc med prn - last one 2 months ago  . 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 Pseudotumor cerebri     Patient Active Problem List   Diagnosis Date Noted  . Headache 03/10/2017  . 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 Intractable chronic migraine without aura and with status migrainosus 05/09/2016  . Pseudotumor cerebri 05/06/2016  . Obesity 05/06/2016  . Chronic migraine 05/06/2016  . Pelvic pain in female 06/19/2015    Past Surgical History:  Procedure Laterality Date  . ABDOMINAL HYSTERECTOMY    . CESAREAN SECTION     x 2  . CYSTOSCOPY N/A 06/19/2015   Procedure: CYSTOSCOPY;  Surgeon: Jaymes GraffNaima Dillard, MD;  Location: [facility];  Service: Gynecology;  Laterality: N/A;  . GASTRECTOMY     sleeve, [facility]  . IR GENERIC HISTORICAL  05/12/2016   IR FLUORO GUIDED NEEDLE PLC ASPIRATION/INJECTION LOC 05/12/2016 Paulina FusiMark Shogry, MD [facility]  . LAPAROSCOPIC VAGINAL HYSTERECTOMY WITH SALPINGECTOMY Bilateral 06/19/2015   Procedure: LAPAROSCOPIC  ASSISTED VAGINAL HYSTERECTOMY WITH SALPINGECTOMY;  Surgeon: Jaymes GraffNaima Dillard, MD;  Location: [facility];  Service: Gynecology;  Laterality: Bilateral;  . lumbar pucture     x2  . TONSILLECTOMY       OB History    Gravida  2   Para  2   Term  2   Preterm      AB      Living  2     SAB      TAB      Ectopic      Multiple      Live Births               Home Medications    Prior to Admission medications   Medication Sig Start Date End Date Taking? Authorizing Provider  amphetamine-dextroamphetamine (ADDERALL XR) 10 MG 24 hr capsule Take 10 mg by mouth daily.    [provider]  clonazePAM (KLONOPIN) 1 MG tablet Take 1 mg by mouth at bedtime. May take an additional 1 mg dose as needed for anxiety 05/05/16 10/13/17  [provider]  diphenhydrAMINE (BENADRYL) 25 MG tablet Take 50 mg by mouth daily as needed for allergies.    [provider]  FLUoxetine (PROZAC) 10 MG tablet Take 10 mg by mouth daily.    [provider]  HYDROcodone-acetaminophen (NORCO) 10-325 MG tablet Take 1 tablet by mouth 3 (three) times daily as needed for severe pain.    [provider]  magnesium citrate solution Take 296 mLs by mouth once.    [provider]  Magnesium Oxide (PHILLIPS) 500 MG (LAX) TABS Take 500 mg by mouth daily as needed (constipation).    [provider]  methocarbamol (ROBAXIN) 750 MG tablet Take 750 mg by mouth 3 (three) times daily as needed for muscle spasms.    [provider]  MOVANTIK 12.5 MG TABS tablet Take 12.5 mg by mouth daily. 10/07/17   [provider]  ondansetron (ZOFRAN-ODT) 8 MG disintegrating tablet Take 8 mg by mouth every 8 (eight) hours as needed for nausea or vomiting.     [provider]  promethazine (PHENERGAN) 25 MG tablet Take 1 tablet (25 mg total) by mouth every 6 (six) hours as needed for nausea or vomiting. 07/07/16   Levert Feinstein, MD  rizatriptan (MAXALT-MLT) 10 MG  disintegrating tablet Take 1 tablet (10 mg total) by mouth as needed for migraine. May repeat in 2 hours if needed 03/10/17   Nilda Riggs, NP  SUMAtriptan (IMITREX) 6 MG/0.5ML SOLN injection Inject 0.5 mLs (6 mg total) into the skin every 2 (two) hours as needed for migraine or headache. May repeat in 2 hours if headache persists or recurs. 07/07/16   Levert Feinstein, MD    Family History Family History  Problem Relation Age of Onset  . Pneumonia Mother        Sepsis  . Heart attack Father     Social History Social History   Tobacco Use  . Smoking status: Never Smoker  . Smokeless tobacco: Never Used  Substance Use Topics  . Alcohol use: Yes    Comment: occasional liquor  . Drug use: No     Allergies   Naproxen; Buspar [buspirone]; Lorazepam; Topiramate; Asa [aspirin]; Macrobid [nitrofurantoin monohyd macro]; Penicillins; and Sudafed [pseudoephedrine hcl]   Review of Systems Review of Systems  All other systems reviewed and are negative.    Physical Exam Updated Vital Signs BP (!) 129/98 (BP Location: Left Arm)   Pulse (!) 121   Temp 97.7 F (36.5 C) (Oral)   Resp 18   Ht 5\' 4"  (1.626 m)   Wt 95.3 kg   LMP 06/12/2015 Comment: negative pregnancy test result today.  SpO2 100%   BMI 36.05 kg/m   Physical Exam  Nursing note and vitals reviewed.  37 year old female, resting comfortably and in no acute distress. Vital signs are significant for elevated diastolic blood pressure and rapid heart rate. Oxygen saturation is 100%, which is normal. Head is normocephalic and atraumatic. PERRLA, EOMI. Oropharynx is clear. Neck is nontender and supple without adenopathy or JVD. Back is nontender and there is no CVA tenderness. Lungs are clear without rales, wheezes, or rhonchi. Chest is nontender. Heart has regular rate and rhythm without murmur. Abdomen is soft, flat, nontender without masses or hepatosplenomegaly and peristalsis is normoactive. Extremities have no  cyanosis or edema, full range of motion is present. Skin is warm and dry without rash. Neurologic: Mental status is normal, cranial nerves are intact, there are no motor or sensory deficits.  ED Treatments / Results   Procedures Procedures   Medications Ordered in ED Medications  sodium chloride 0.9 % bolus 1,000 mL (1,000 mLs Intravenous New Bag/Given 10/25/17 0310)  ketorolac (TORADOL) 30 MG/ML injection 30 mg (30 mg Intravenous Given 10/25/17 0310)  metoCLOPramide (REGLAN) injection 10 mg (10 mg Intravenous Given 10/25/17 0311)  diphenhydrAMINE (BENADRYL) injection 25 mg (25 mg Intravenous Given 10/25/17  1610)  hydrOXYzine (VISTARIL) injection 50 mg (50 mg Intramuscular Given 10/25/17 0424)     Initial Impression / Assessment and Plan / ED Course  I have reviewed the triage vital signs and the nursing notes.  Anxiety related to upcoming surgical procedure.  Migraine headache.  Unfortunately, patient has an intolerance to lorazepam.  She is requesting a migraine cocktail and she will be given cocktail of normal saline, metoclopramide, ketorolac, diphenhydramine and reassessed.  Old records are reviewed, and she does have occasional ED visits for her migraines.  3:56 AM Headache is improving following above-noted treatment, but she continues to have generalized anxiety.  She will be given a parenteral dose of hydroxyzine.  5:26 AM She feels much better after above-noted treatment.  Headache has completely resolved and anxiety is significantly improved.  She is discharged with prescription for hydroxyzine.  Final Clinical Impressions(s) / ED Diagnoses   Final diagnoses:  Anxiety  Migraine without aura and without status migrainosus, not intractable    ED Discharge Orders         Ordered    hydrOXYzine (ATARAX/VISTARIL) 25 MG tablet  Every 6 hours PRN     10/25/17 0526           Dione Booze, MD 10/25/17 [phone]

## 2017-10-25 NOTE — ED Triage Notes (Addendum)
Pt reports that she has surgery scheduled for Wednesday (L hip fusion) and she is nervous. She states that she took her last Norco this morning. She reports a history of anxiety and panic attacks and states that usually she can just get through them, but she is feeling more "on edge" than usual. Denies SI or HI. She reports that she took a Klonopin around 7p without relief.

## 2017-10-27 NOTE — Anesthesia Preprocedure Evaluation (Addendum)
Anesthesia Evaluation  Patient identified by MRN, date of birth, ID band Patient awake    Reviewed: Allergy & Precautions, NPO status , Patient's Chart, lab work & pertinent test results  Airway Mallampati: II  TM Distance: >3 FB Neck ROM: Full    Dental no notable dental hx. (+) Edentulous Upper, Dental Advisory Given   Pulmonary neg pulmonary ROS,    Pulmonary exam normal breath sounds clear to auscultation       Cardiovascular Exercise Tolerance: Good negative cardio ROS Normal cardiovascular exam Rhythm:Regular Rate:Normal     Neuro/Psych  Headaches, Anxiety negative neurological ROS     GI/Hepatic Neg liver ROS, GERD  Medicated,  Endo/Other  negative endocrine ROS  Renal/GU negative Renal ROS     Musculoskeletal   Abdominal (+) + obese,   Peds  Hematology negative hematology ROS (+)   Anesthesia Other Findings   Reproductive/Obstetrics negative OB ROS                            Lab Results  Component Value Date   WBC 7.2 10/19/2017   HGB 13.2 10/19/2017   HCT 41.4 10/19/2017   MCV 100.2 (H) 10/19/2017   PLT 296 10/19/2017   Lab Results  Component Value Date   PREGTESTUR NEGATIVE 11/18/2015    Anesthesia Physical Anesthesia Plan  ASA: II  Anesthesia Plan: General   Post-op Pain Management:    Induction: Intravenous  PONV Risk Score and Plan: 3 and Scopolamine patch - Pre-op, Treatment may vary due to age or medical condition and Dexamethasone  Airway Management Planned: Oral ETT  Additional Equipment:   Intra-op Plan:   Post-operative Plan: Extubation in OR  Informed Consent: I have reviewed the patients History and Physical, chart, labs and discussed the procedure including the risks, benefits and alternatives for the proposed anesthesia with the patient or authorized representative who has indicated his/her understanding and acceptance.   Dental advisory  given  Plan Discussed with: CRNA  Anesthesia Plan Comments:        Anesthesia Quick Evaluation

## 2017-10-28 ENCOUNTER — Encounter (HOSPITAL_COMMUNITY)
Admission: RE | Disposition: A | Discharge status: Home / Self Care | Payer: Self-pay | Source: Ambulatory Visit | Attending: Orthopedic Surgery

## 2017-10-28 ENCOUNTER — Encounter (HOSPITAL_COMMUNITY): Payer: Self-pay | Admitting: Urology

## 2017-10-28 ENCOUNTER — Ambulatory Visit (HOSPITAL_COMMUNITY): Payer: BLUE CROSS/BLUE SHIELD

## 2017-10-28 ENCOUNTER — Other Ambulatory Visit: Payer: Self-pay

## 2017-10-28 ENCOUNTER — Observation Stay (HOSPITAL_COMMUNITY)
Admission: RE | Admit: 2017-10-28 | Discharge: 2017-10-29 | Disposition: A | Discharge status: Home / Self Care | Payer: BLUE CROSS/BLUE SHIELD | Source: Ambulatory Visit | Attending: Orthopedic Surgery | Admitting: Orthopedic Surgery

## 2017-10-28 ENCOUNTER — Ambulatory Visit (HOSPITAL_COMMUNITY): Payer: BLUE CROSS/BLUE SHIELD | Admitting: Anesthesiology

## 2017-10-28 DIAGNOSIS — Z419 Encounter for procedure for purposes other than remedying health state, unspecified: Secondary | ICD-10-CM

## 2017-10-28 DIAGNOSIS — Z880 Allergy status to penicillin: Secondary | ICD-10-CM | POA: Insufficient documentation

## 2017-10-28 DIAGNOSIS — Z886 Allergy status to analgesic agent status: Secondary | ICD-10-CM | POA: Diagnosis not present

## 2017-10-28 DIAGNOSIS — R2681 Unsteadiness on feet: Secondary | ICD-10-CM | POA: Diagnosis not present

## 2017-10-28 DIAGNOSIS — M533 Sacrococcygeal disorders, not elsewhere classified: Secondary | ICD-10-CM | POA: Diagnosis present

## 2017-10-28 DIAGNOSIS — F909 Attention-deficit hyperactivity disorder, unspecified type: Secondary | ICD-10-CM | POA: Insufficient documentation

## 2017-10-28 DIAGNOSIS — G932 Benign intracranial hypertension: Secondary | ICD-10-CM | POA: Insufficient documentation

## 2017-10-28 DIAGNOSIS — Z79899 Other long term (current) drug therapy: Secondary | ICD-10-CM | POA: Insufficient documentation

## 2017-10-28 DIAGNOSIS — Z881 Allergy status to other antibiotic agents status: Secondary | ICD-10-CM | POA: Diagnosis not present

## 2017-10-28 DIAGNOSIS — K219 Gastro-esophageal reflux disease without esophagitis: Secondary | ICD-10-CM | POA: Diagnosis not present

## 2017-10-28 DIAGNOSIS — F419 Anxiety disorder, unspecified: Secondary | ICD-10-CM | POA: Insufficient documentation

## 2017-10-28 DIAGNOSIS — F329 Major depressive disorder, single episode, unspecified: Secondary | ICD-10-CM | POA: Diagnosis not present

## 2017-10-28 DIAGNOSIS — Z981 Arthrodesis status: Secondary | ICD-10-CM

## 2017-10-28 HISTORY — PX: SACROILIAC JOINT FUSION: SHX6088

## 2017-10-28 SURGERY — SACROILIAC JOINT FUSION
Anesthesia: General | Site: Back | Laterality: Left

## 2017-10-28 MED ORDER — KETAMINE HCL 10 MG/ML IJ SOLN
INTRAMUSCULAR | Status: DC | PRN
  Administered 2017-10-28: 50 mg via INTRAVENOUS

## 2017-10-28 MED ORDER — OXYCODONE HCL 5 MG PO TABS: 5.0000 mg | ORAL_TABLET | ORAL | Status: DC | PRN

## 2017-10-28 MED ORDER — AMPHETAMINE-DEXTROAMPHET ER 10 MG PO CP24: 10.0000 mg | ORAL_CAPSULE | Freq: Every day | ORAL | Status: DC

## 2017-10-28 MED ORDER — DEXAMETHASONE SODIUM PHOSPHATE 10 MG/ML IJ SOLN
INTRAMUSCULAR | Status: AC
  Filled 2017-10-28: qty 1

## 2017-10-28 MED ORDER — ACETAMINOPHEN 650 MG RE SUPP: 650.0000 mg | RECTAL | Status: DC | PRN

## 2017-10-28 MED ORDER — BUPIVACAINE LIPOSOME 1.3 % IJ SUSP
20.0000 mL | INTRAMUSCULAR | Status: DC
  Filled 2017-10-28: qty 20

## 2017-10-28 MED ORDER — OXYCODONE-ACETAMINOPHEN 10-325 MG PO TABS: 1.0000 | ORAL_TABLET | Freq: Four times a day (QID) | ORAL | 0 refills | Status: AC | PRN

## 2017-10-28 MED ORDER — DEXAMETHASONE SODIUM PHOSPHATE 10 MG/ML IJ SOLN
INTRAMUSCULAR | Status: DC | PRN
  Administered 2017-10-28: 10 mg via INTRAVENOUS

## 2017-10-28 MED ORDER — BUPIVACAINE LIPOSOME 1.3 % IJ SUSP
INTRAMUSCULAR | Status: DC | PRN
  Administered 2017-10-28: 20 mL

## 2017-10-28 MED ORDER — HYDROMORPHONE HCL 1 MG/ML IJ SOLN
INTRAMUSCULAR | Status: AC
  Filled 2017-10-28: qty 1

## 2017-10-28 MED ORDER — VANCOMYCIN HCL IN DEXTROSE 1-5 GM/200ML-% IV SOLN
1000.0000 mg | INTRAVENOUS | Status: AC
  Administered 2017-10-28: 1000 mg via INTRAVENOUS
  Filled 2017-10-28: qty 200

## 2017-10-28 MED ORDER — FLUOXETINE HCL 10 MG PO CAPS
10.0000 mg | ORAL_CAPSULE | Freq: Every day | ORAL | Status: DC
  Administered 2017-10-28 – 2017-10-29 (×2): 10 mg via ORAL
  Filled 2017-10-28 (×2): qty 1

## 2017-10-28 MED ORDER — KETAMINE HCL 50 MG/ML IJ SOLN: INTRAMUSCULAR | Status: DC | PRN

## 2017-10-28 MED ORDER — MIDAZOLAM HCL 5 MG/5ML IJ SOLN
INTRAMUSCULAR | Status: DC | PRN
  Administered 2017-10-28: 2 mg via INTRAVENOUS

## 2017-10-28 MED ORDER — LACTATED RINGERS IV SOLN: INTRAVENOUS | Status: DC

## 2017-10-28 MED ORDER — BUPIVACAINE-EPINEPHRINE (PF) 0.25% -1:200000 IJ SOLN
INTRAMUSCULAR | Status: AC
  Filled 2017-10-28: qty 30

## 2017-10-28 MED ORDER — VANCOMYCIN HCL 10 G IV SOLR
1250.0000 mg | Freq: Once | INTRAVENOUS | Status: AC
  Administered 2017-10-28: 1250 mg via INTRAVENOUS
  Filled 2017-10-28: qty 1250

## 2017-10-28 MED ORDER — ROCURONIUM BROMIDE 50 MG/5ML IV SOSY
PREFILLED_SYRINGE | INTRAVENOUS | Status: AC
  Filled 2017-10-28: qty 5

## 2017-10-28 MED ORDER — DIPHENHYDRAMINE HCL 50 MG/ML IJ SOLN
INTRAMUSCULAR | Status: AC
  Filled 2017-10-28: qty 1

## 2017-10-28 MED ORDER — PROMETHAZINE HCL 25 MG/ML IJ SOLN: 6.2500 mg | INTRAMUSCULAR | Status: DC | PRN

## 2017-10-28 MED ORDER — HYDROCODONE-ACETAMINOPHEN 7.5-325 MG PO TABS: 1.0000 | ORAL_TABLET | Freq: Once | ORAL | Status: DC | PRN

## 2017-10-28 MED ORDER — LIDOCAINE 2% (20 MG/ML) 5 ML SYRINGE
INTRAMUSCULAR | Status: DC | PRN
  Administered 2017-10-28: 60 mg via INTRAVENOUS
  Administered 2017-10-28: 40 mg via INTRAVENOUS

## 2017-10-28 MED ORDER — RIZATRIPTAN BENZOATE 10 MG PO TBDP
10.0000 mg | ORAL_TABLET | ORAL | Status: DC | PRN
  Filled 2017-10-28: qty 1

## 2017-10-28 MED ORDER — SODIUM CHLORIDE 0.9% FLUSH: 3.0000 mL | Freq: Two times a day (BID) | INTRAVENOUS | Status: DC

## 2017-10-28 MED ORDER — ONDANSETRON HCL 4 MG/2ML IJ SOLN: 4.0000 mg | Freq: Four times a day (QID) | INTRAMUSCULAR | Status: DC | PRN

## 2017-10-28 MED ORDER — SODIUM CHLORIDE 0.9% FLUSH: 3.0000 mL | INTRAVENOUS | Status: DC | PRN

## 2017-10-28 MED ORDER — METHOCARBAMOL 500 MG PO TABS
ORAL_TABLET | ORAL | Status: AC
  Filled 2017-10-28: qty 2

## 2017-10-28 MED ORDER — SCOPOLAMINE 1 MG/3DAYS TD PT72
MEDICATED_PATCH | TRANSDERMAL | Status: AC
  Filled 2017-10-28: qty 1

## 2017-10-28 MED ORDER — LACTATED RINGERS IV SOLN
INTRAVENOUS | Status: DC
  Administered 2017-10-28 (×2): via INTRAVENOUS

## 2017-10-28 MED ORDER — PROPOFOL 10 MG/ML IV BOLUS
INTRAVENOUS | Status: DC | PRN
  Administered 2017-10-28: 150 mg via INTRAVENOUS
  Administered 2017-10-28: 50 mg via INTRAVENOUS

## 2017-10-28 MED ORDER — ONDANSETRON HCL 4 MG/2ML IJ SOLN
INTRAMUSCULAR | Status: AC
  Filled 2017-10-28: qty 2

## 2017-10-28 MED ORDER — HYDROMORPHONE HCL 1 MG/ML IJ SOLN
0.2500 mg | INTRAMUSCULAR | Status: DC | PRN
  Administered 2017-10-28 (×4): 0.5 mg via INTRAVENOUS

## 2017-10-28 MED ORDER — FENTANYL CITRATE (PF) 250 MCG/5ML IJ SOLN
INTRAMUSCULAR | Status: AC
  Filled 2017-10-28: qty 5

## 2017-10-28 MED ORDER — MORPHINE SULFATE (PF) 2 MG/ML IV SOLN
1.0000 mg | INTRAVENOUS | Status: DC | PRN
  Administered 2017-10-28 (×2): 1 mg via INTRAVENOUS
  Filled 2017-10-28 (×2): qty 1

## 2017-10-28 MED ORDER — ACETAMINOPHEN 10 MG/ML IV SOLN: 1000.0000 mg | Freq: Once | INTRAVENOUS | Status: DC | PRN

## 2017-10-28 MED ORDER — OXYCODONE HCL 5 MG PO TABS
10.0000 mg | ORAL_TABLET | ORAL | Status: DC | PRN
  Administered 2017-10-28 – 2017-10-29 (×5): 10 mg via ORAL
  Filled 2017-10-28 (×4): qty 2

## 2017-10-28 MED ORDER — ROCURONIUM BROMIDE 10 MG/ML (PF) SYRINGE
PREFILLED_SYRINGE | INTRAVENOUS | Status: DC | PRN
  Administered 2017-10-28: 50 mg via INTRAVENOUS

## 2017-10-28 MED ORDER — PHENOL 1.4 % MT LIQD: 1.0000 | OROMUCOSAL | Status: DC | PRN

## 2017-10-28 MED ORDER — 0.9 % SODIUM CHLORIDE (POUR BTL) OPTIME
TOPICAL | Status: DC | PRN
  Administered 2017-10-28: 1000 mL

## 2017-10-28 MED ORDER — POLYETHYLENE GLYCOL 3350 17 G PO PACK: 17.0000 g | PACK | Freq: Every day | ORAL | Status: DC | PRN

## 2017-10-28 MED ORDER — FLUCONAZOLE 200 MG PO TABS
200.0000 mg | ORAL_TABLET | Freq: Once | ORAL | Status: AC
  Administered 2017-10-28: 200 mg via ORAL
  Filled 2017-10-28: qty 1

## 2017-10-28 MED ORDER — MENTHOL 3 MG MT LOZG: 1.0000 | LOZENGE | OROMUCOSAL | Status: DC | PRN

## 2017-10-28 MED ORDER — HYDROXYZINE HCL 25 MG PO TABS
25.0000 mg | ORAL_TABLET | Freq: Four times a day (QID) | ORAL | Status: DC | PRN
  Administered 2017-10-28 – 2017-10-29 (×2): 50 mg via ORAL
  Filled 2017-10-28 (×2): qty 2

## 2017-10-28 MED ORDER — PHENYLEPHRINE 40 MCG/ML (10ML) SYRINGE FOR IV PUSH (FOR BLOOD PRESSURE SUPPORT)
PREFILLED_SYRINGE | INTRAVENOUS | Status: DC | PRN
  Administered 2017-10-28 (×4): 80 ug via INTRAVENOUS

## 2017-10-28 MED ORDER — BUPIVACAINE-EPINEPHRINE 0.25% -1:200000 IJ SOLN
INTRAMUSCULAR | Status: DC | PRN
  Administered 2017-10-28: 20 mL

## 2017-10-28 MED ORDER — SODIUM CHLORIDE 0.9 % IV SOLN: 250.0000 mL | INTRAVENOUS | Status: DC

## 2017-10-28 MED ORDER — DOCUSATE SODIUM 100 MG PO CAPS
100.0000 mg | ORAL_CAPSULE | Freq: Two times a day (BID) | ORAL | Status: DC
  Administered 2017-10-28 – 2017-10-29 (×3): 100 mg via ORAL
  Filled 2017-10-28 (×3): qty 1

## 2017-10-28 MED ORDER — ACETAMINOPHEN 325 MG PO TABS
650.0000 mg | ORAL_TABLET | ORAL | Status: DC | PRN
  Administered 2017-10-28 – 2017-10-29 (×2): 650 mg via ORAL
  Filled 2017-10-28 (×2): qty 2

## 2017-10-28 MED ORDER — OXYCODONE HCL 5 MG PO TABS
ORAL_TABLET | ORAL | Status: AC
  Filled 2017-10-28: qty 2

## 2017-10-28 MED ORDER — MAGNESIUM CITRATE PO SOLN: 1.0000 | Freq: Once | ORAL | Status: DC | PRN

## 2017-10-28 MED ORDER — SCOPOLAMINE 1 MG/3DAYS TD PT72
MEDICATED_PATCH | TRANSDERMAL | Status: DC | PRN
  Administered 2017-10-28: 1 via TRANSDERMAL

## 2017-10-28 MED ORDER — ONDANSETRON 4 MG PO TBDP: 8.0000 mg | ORAL_TABLET | Freq: Three times a day (TID) | ORAL | Status: DC | PRN

## 2017-10-28 MED ORDER — LIDOCAINE 2% (20 MG/ML) 5 ML SYRINGE
INTRAMUSCULAR | Status: AC
  Filled 2017-10-28: qty 5

## 2017-10-28 MED ORDER — SUGAMMADEX SODIUM 200 MG/2ML IV SOLN
INTRAVENOUS | Status: DC | PRN
  Administered 2017-10-28: 200 mg via INTRAVENOUS

## 2017-10-28 MED ORDER — FENTANYL CITRATE (PF) 100 MCG/2ML IJ SOLN
INTRAMUSCULAR | Status: DC | PRN
  Administered 2017-10-28: 50 ug via INTRAVENOUS
  Administered 2017-10-28: 100 ug via INTRAVENOUS
  Administered 2017-10-28: 50 ug via INTRAVENOUS

## 2017-10-28 MED ORDER — MIDAZOLAM HCL 2 MG/2ML IJ SOLN
INTRAMUSCULAR | Status: AC
  Filled 2017-10-28: qty 2

## 2017-10-28 MED ORDER — MEPERIDINE HCL 50 MG/ML IJ SOLN: 6.2500 mg | INTRAMUSCULAR | Status: DC | PRN

## 2017-10-28 MED ORDER — ONDANSETRON HCL 4 MG PO TABS
4.0000 mg | ORAL_TABLET | Freq: Four times a day (QID) | ORAL | Status: DC | PRN
  Administered 2017-10-29: 4 mg via ORAL
  Filled 2017-10-28: qty 1

## 2017-10-28 MED ORDER — KETAMINE HCL 50 MG/5ML IJ SOSY
PREFILLED_SYRINGE | INTRAMUSCULAR | Status: AC
  Filled 2017-10-28: qty 5

## 2017-10-28 MED ORDER — METHOCARBAMOL 750 MG PO TABS
750.0000 mg | ORAL_TABLET | Freq: Three times a day (TID) | ORAL | Status: DC | PRN
  Administered 2017-10-28 – 2017-10-29 (×3): 750 mg via ORAL
  Filled 2017-10-28 (×2): qty 1

## 2017-10-28 MED ORDER — PROPOFOL 10 MG/ML IV BOLUS
INTRAVENOUS | Status: AC
  Filled 2017-10-28: qty 20

## 2017-10-28 SURGICAL SUPPLY — 50 items
BLADE SURG 11 STRL SS (BLADE) ×2 IMPLANT
CLSR STERI-STRIP ANTIMIC 1/2X4 (GAUZE/BANDAGES/DRESSINGS) ×2 IMPLANT
COVER SURGICAL LIGHT HANDLE (MISCELLANEOUS) ×2 IMPLANT
DRAPE C-ARM 42X72 X-RAY (DRAPES) ×2 IMPLANT
DRAPE C-ARMOR (DRAPES) ×2 IMPLANT
DRAPE SURG 17X23 STRL (DRAPES) ×2 IMPLANT
DRAPE U-SHAPE 47X51 STRL (DRAPES) ×2 IMPLANT
DRSG OPSITE POSTOP 3X4 (GAUZE/BANDAGES/DRESSINGS) ×2 IMPLANT
DRSG OPSITE POSTOP 4X6 (GAUZE/BANDAGES/DRESSINGS) ×1 IMPLANT
DURAPREP 26ML APPLICATOR (WOUND CARE) ×2 IMPLANT
ELECT BLADE 4.0 EZ CLEAN MEGAD (MISCELLANEOUS) ×2
ELECT PENCIL ROCKER SW 15FT (MISCELLANEOUS) ×2 IMPLANT
ELECT REM PT RETURN 9FT ADLT (ELECTROSURGICAL) ×2
ELECTRODE BLDE 4.0 EZ CLN MEGD (MISCELLANEOUS) ×1 IMPLANT
ELECTRODE REM PT RTRN 9FT ADLT (ELECTROSURGICAL) ×1 IMPLANT
GLOVE BIOGEL PI IND STRL 8.5 (GLOVE) ×1 IMPLANT
GLOVE BIOGEL PI INDICATOR 8.5 (GLOVE) ×1
GLOVE SS BIOGEL STRL SZ 8.5 (GLOVE) ×1 IMPLANT
GLOVE SUPERSENSE BIOGEL SZ 8.5 (GLOVE) ×1
GOWN STRL REUS W/ TWL LRG LVL3 (GOWN DISPOSABLE) ×1 IMPLANT
GOWN STRL REUS W/TWL 2XL LVL3 (GOWN DISPOSABLE) ×2 IMPLANT
GOWN STRL REUS W/TWL LRG LVL3 (GOWN DISPOSABLE) ×2
IMPL IFUSE 3D 7X35 (Rod) IMPLANT
IMPL IFUSE 7.0X50 (Rod) IMPLANT
IMPLANT IFUSE 3D 7X35 (Rod) ×2 IMPLANT
IMPLANT IFUSE 7.0MMX40MM (Rod) ×2 IMPLANT
IMPLANT IFUSE 7.0X50 (Rod) ×2 IMPLANT
KIT BASIN OR (CUSTOM PROCEDURE TRAY) ×2 IMPLANT
KIT TURNOVER KIT B (KITS) ×2 IMPLANT
NDL SPNL 18GX3.5 QUINCKE PK (NEEDLE) IMPLANT
NEEDLE 22X1 1/2 (OR ONLY) (NEEDLE) ×2 IMPLANT
NEEDLE SPNL 18GX3.5 QUINCKE PK (NEEDLE) ×2 IMPLANT
NS IRRIG 1000ML POUR BTL (IV SOLUTION) ×2 IMPLANT
PACK LAMINECTOMY ORTHO (CUSTOM PROCEDURE TRAY) ×2 IMPLANT
PACK UNIVERSAL I (CUSTOM PROCEDURE TRAY) ×2 IMPLANT
PAD ARMBOARD 7.5X6 YLW CONV (MISCELLANEOUS) ×4 IMPLANT
POSITIONER HEAD PRONE TRACH (MISCELLANEOUS) ×2 IMPLANT
STAPLER VISISTAT 35W (STAPLE) ×2 IMPLANT
SUT MNCRL AB 3-0 PS2 18 (SUTURE) IMPLANT
SUT MON AB 3-0 SH 27 (SUTURE) ×2
SUT MON AB 3-0 SH27 (SUTURE) IMPLANT
SUT VIC AB 1 CT1 18XCR BRD 8 (SUTURE) ×1 IMPLANT
SUT VIC AB 1 CT1 8-18 (SUTURE) ×2
SUT VIC AB 2-0 CT1 18 (SUTURE) ×2 IMPLANT
SYR BULB IRRIGATION 50ML (SYRINGE) ×2 IMPLANT
SYR CONTROL 10ML LL (SYRINGE) ×2 IMPLANT
SYS SPNL FX3ANG 40X7X (Rod) ×1 IMPLANT
SYSTEM SPNL FX3ANG 40X7X (Rod) IMPLANT
TOWEL OR 17X26 10 PK STRL BLUE (TOWEL DISPOSABLE) ×2 IMPLANT
YANKAUER SUCT BULB TIP NO VENT (SUCTIONS) ×2 IMPLANT

## 2017-10-28 NOTE — Progress Notes (Signed)
ANTIBIOTIC CONSULT NOTE - INITIAL  Pharmacy Consult for Vancomycin x 1 dose post-op Indication: surgical prophylaxis  Allergies  Allergen Reactions  . Aspirin Other (See Comments)    Stomach pain Severe abdominal pain  . Lorazepam Other (See Comments)    "Can't remember anything while on it" aggressive   . Naproxen Nausea And Vomiting and Other (See Comments)    cramping  . Penicillins Hives    Has patient had a PCN reaction causing immediate rash, facial/tongue/throat swelling, SOB or lightheadedness with hypotension: No Has patient had a PCN reaction causing severe rash involving mucus membranes or skin necrosis: No Has patient had a PCN reaction that required hospitalization No Has patient had a PCN reaction occurring within the last 10 years: No If all of the above answers are "NO", then may proceed with Cephalosporin use.  . Buspar [Buspirone] Other (See Comments)    "felt weird, heart raced, cloudy"  . Macrobid [employer][Nitrofurantoin Monohyd Macro] Other (See Comments)    Stomach pain  . Sudafed [Pseudoephedrine Hcl] Hives  . Topiramate Other (See Comments)    Numbness/tingling in mouth/feet    Patient Measurements: Height: 5\' 5"  (165.1 cm) Weight: 210 lb (95.3 kg) IBW/kg (Calculated) : 57  Vital Signs: Temp: 98 F (36.7 C) (08/28 1428) Temp Source: Oral (08/28 1428) BP: 117/69 (08/28 1428) Pulse Rate: 87 (08/28 1428)  Pre-op Labs from 10/19/17:  Creatinine 0.73, WBC 7.2  Microbiology: Recent Results (from the past 720 hour(s))  Surgical pcr screen     Status: Abnormal   Collection Time: 10/19/17  3:38 PM  Result Value Ref Range Status   MRSA, PCR NEGATIVE NEGATIVE Final   Staphylococcus aureus POSITIVE (A) NEGATIVE Final    Comment: (NOTE) The Xpert SA Assay (FDA approved for NASAL specimens in patients 37 years of age and older), is one component of a comprehensive surveillance program. It is not intended to diagnose infection nor to guide or monitor  treatment. Performed at [facility], [address] [place], [place] [address]     Medical History: Past Medical History:  Diagnosis Date  . ADHD (attention deficit hyperactivity disorder)   . ADHD, adult residual type   . Anxiety   . Depression   . Family history of adverse reaction to anesthesia    sister has PONV  . GERD (gastroesophageal reflux disease)   . Headache    otc med prn - last one 2 months ago  . 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 Pseudotumor cerebri     Assessment:   37 yr old female s/p sacroiliac joint fusion.  PCN allergy, Vancomycin 1gm IV given pre-op at 0751.  No drain.  For 1 dose of Vancomycin 12 hrs post-op.  Goal of Therapy:  Vancomycin trough level 10-15 mcg/ml  Plan:   Vancomycin 1250 mg IV x 1 at 8pm tonight.  No follow up needed.  Pharmacy to sign off.  Dennie Fettersgan, Elody Kleinsasser [place], [place] Pager: [phone] 10/28/2017,4:14 PM

## 2017-10-28 NOTE — Discharge Instructions (Signed)
Spinal Fusion, Care After °These instructions give you information about caring for yourself after your procedure. Your doctor may also give you more specific instructions. Call your doctor if you have any problems or questions after your procedure. °Follow these instructions at home: °Medicines °· Take over-the-counter and prescription medicines only as told by your doctor. These include any medicines for pain. °· Do not drive for 24 hours if you received a sedative. °· Do not drive or use heavy machinery while taking prescription pain medicine. °· If you were prescribed an antibiotic medicine, take it as told by your doctor. Do not stop taking the antibiotic even if you start to feel better. °Surgical Cut (Incision) Care °· Follow instructions from your doctor about how to take care of your surgical cut. Make sure you: °? Wash your hands with soap and water before you change your bandage (dressing). If you cannot use soap and water, use hand sanitizer. °? Change your bandage as told by your doctor. °? Leave stitches (sutures), skin glue, or skin tape (adhesive) strips in place. They may need to stay in place for 2 weeks or longer. If tape strips get loose and curl up, you may trim the loose edges. Do not remove tape strips completely unless your doctor says it is okay. °· Keep your surgical cut clean and dry. Do not take baths, swim, or use a hot tub until your doctor says it is okay. °· Check your surgical cut and the area around it every day for: °? Redness. °? Swelling. °? Fluid. °Physical Activity °· Return to your normal activities as told by your doctor. Ask your doctor what activities are safe for you. Rest and protect your back as much as you can. °· Follow instructions from your doctor about how to move. Use good posture to help your spine heal. °· Do not lift anything that is heavier than 8 lb (3.6 kg) or as told by your doctor until he or she says that it is safe. Do not lift anything over your  head. °· Do not twist or bend at the waist until your doctor says it is okay. °· Avoid pushing or pulling motions. °· Do not sit or lie down in the same position for long periods of time. °· Do not start to exercise until your doctor says it is okay. Ask your doctor what kinds of exercise you can do to make your back stronger. °General instructions °· If you were given a brace, use it as told by your doctor. °· Wear compression stockings as told by your doctor. °· Do not use tobacco products. These include cigarettes, chewing tobacco, or e-cigarettes. If you need help quitting, ask your doctor. °· Keep all follow-up visits as told by your doctor. This is important. This includes any visits with your physical therapist, if this applies. °Contact a doctor if: °· Your pain gets worse. °· Your medicine does not help your pain. °· Your legs or feet become painful or swollen. °· Your surgical cut is red, swollen, or painful. °· You have fluid, blood, or pus coming from your surgical cut. °· You feel sick to your stomach (nauseous). °· You throw up (vomit). °· Your have weakness or loss of feeling (numbness) in your legs that is new or getting worse. °· You have a fever. °· You have trouble controlling when you pee (urinate) or poop (have a bowel movement). °Get help right away if: °· Your pain is very bad. °· You have   chest pain. °· You have trouble breathing. °· You start to have a cough. °These symptoms may be an emergency. Do not wait to see if the symptoms will go away. Get medical help right away. Call your local emergency services (911 in the [place] Do not drive yourself to the hospital. °This information is not intended to replace advice given to you by your health care provider. Make sure you discuss any questions you have with your health care provider. °Document Released: 06/13/2010 Document Revised: 10/16/2015 Document Reviewed: 08/02/2014 °Elsevier Interactive Patient Education © 2018 [employer] ° °

## 2017-10-28 NOTE — Brief Op Note (Signed)
10/28/2017  12:14 PM  PATIENT:  [name]  37 y.o. female  PRE-OPERATIVE DIAGNOSIS:  Sacroliliac dysfunction  POST-OPERATIVE DIAGNOSIS:  Sacroliliac dysfunction  PROCEDURE:  Procedure(s) with comments: SACROILIAC JOINT FUSION (Left) - 90 mins  SURGEON:  Surgeon(s) and Role:    Venita Lick* Sharine Cadle, MD - Primary  PHYSICIAN ASSISTANT:   ASSISTANTS: [name]   ANESTHESIA:   general  EBL:  10 mL   BLOOD ADMINISTERED:none  DRAINS: none   LOCAL MEDICATIONS USED:  MARCAINE    and OTHER exparel  SPECIMEN:  No Specimen  DISPOSITION OF SPECIMEN:  N/A  COUNTS:  YES  TOURNIQUET:  * No tourniquets in log *  DICTATION: .Dragon Dictation  PLAN OF CARE: Discharge to home after PACU  PATIENT DISPOSITION:  PACU - hemodynamically stable.

## 2017-10-28 NOTE — Anesthesia Procedure Notes (Signed)
Procedure Name: Intubation Date/Time: 10/28/2017 10:06 AM Performed by: Jenne Campus, CRNA Pre-anesthesia Checklist: Patient identified, Emergency Drugs available, Suction available, Patient being monitored and Timeout performed Patient Re-evaluated:Patient Re-evaluated prior to induction Oxygen Delivery Method: Circle system utilized Preoxygenation: Pre-oxygenation with 100% oxygen Induction Type: IV induction Ventilation: Mask ventilation without difficulty Laryngoscope Size: Mac and 3 Grade View: Grade I Tube type: Oral Tube size: 7.5 mm Number of attempts: 1 Airway Equipment and Method: Stylet Placement Confirmation: ETT inserted through vocal cords under direct vision,  positive ETCO2,  CO2 detector and breath sounds checked- equal and bilateral Secured at: 21 cm Tube secured with: Tape Dental Injury: Teeth and Oropharynx as per pre-operative assessment  Comments: ETT placed by Violet Baldy

## 2017-10-28 NOTE — Progress Notes (Signed)
Report given to Monasia rn as caregiver 

## 2017-10-28 NOTE — Anesthesia Postprocedure Evaluation (Signed)
Anesthesia Post Note  Patient: Giordana J Herrero  Procedure(s) Performed: SACROILIAC JOINT FUSION (Left Back)     Patient location during evaluation: PACU Anesthesia Type: General Level of consciousness: awake and alert Pain management: pain level controlled Vital Signs Assessment: post-procedure vital signs reviewed and stable Respiratory status: spontaneous breathing, nonlabored ventilation, respiratory function stable and patient connected to nasal cannula oxygen Cardiovascular status: blood pressure returned to baseline and stable Postop Assessment: no apparent nausea or vomiting Anesthetic complications: no    Last Vitals:  Vitals:   10/28/17 1245 10/28/17 1300  BP: 110/69 116/72  Pulse: 87 86  Resp: 16 14  Temp:    SpO2: 100% 96%    Last Pain:  Vitals:   10/28/17 1300  TempSrc:   PainSc: 9                  Trevor IhaStephen A Houser

## 2017-10-28 NOTE — Transfer of Care (Signed)
Immediate Anesthesia Transfer of Care Note  Patient: [name]  Procedure(s) Performed: SACROILIAC JOINT FUSION (Left Back)  Patient Location: PACU  Anesthesia Type:General  Level of Consciousness: awake, alert , oriented and patient cooperative  Airway & Oxygen Therapy: Patient Spontanous Breathing and Patient connected to face mask oxygen  Post-op Assessment: Report given to RN and Post -op Vital signs reviewed and stable  Post vital signs: Reviewed and stable  Last Vitals: 120/66, 98, 20, 100% Vitals Value Taken Time  BP 120/66 10/28/2017 11:53 AM  Temp    Pulse 107 10/28/2017 11:53 AM  Resp 17 10/28/2017 11:53 AM  SpO2 100 % 10/28/2017 11:53 AM  Vitals shown include unvalidated device data.  Last Pain:  Vitals:   10/28/17 0743  TempSrc:   PainSc: 7          Complications: No apparent anesthesia complications

## 2017-10-28 NOTE — Progress Notes (Signed)
10/28/17 1819  PT Visit Information  Last PT Received On 10/28/17  Assistance Needed +1  History of Present Illness 37 yo female s/p left SI fusion. PMH including ADHD, anxiety, depression, GERD, and pseudotumor cerebri.   Precautions  Precautions Back;Fall  Precaution Booklet Issued No  Precaution Comments Per OP note, NWBing at LLE  Restrictions  Weight Bearing Restrictions Yes  LLE Weight Bearing NWB  Home Living  Family/patient expects to be discharged to: Private residence  Living Arrangements Spouse/significant other  Available Help at Discharge Family;Available PRN/intermittently  Type of Home House  Home Access Stairs to enter  Entrance Stairs-Number of Steps 3  Entrance Stairs-Rails Right  Home Layout One level  Bathroom Shower/Tub Tub/shower unit;Walk-in shower  Bathroom Toilet Handicapped height  Home Equipment Crutches;Grab bars - toilet;Grab bars - tub/shower;Hand held shower head  Prior Function  Level of Independence Independent  Comments 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  Communication  Communication No difficulties  Pain Assessment  Pain Assessment Faces  Faces Pain Scale 8  Pain Location LLE  Pain Descriptors / Indicators Shooting  Pain Intervention(s) Limited activity within patient's tolerance;Monitored during session;Repositioned  Cognition  Arousal/Alertness Awake/alert  Behavior During Therapy WFL for tasks assessed/performed  Overall Cognitive Status Within Functional Limits for tasks assessed  Upper Extremity Assessment  Upper Extremity Assessment Defer to OT evaluation  Lower Extremity Assessment  Lower Extremity Assessment LLE deficits/detail  LLE Deficits / Details NWBing. s/p left SI fusion  Cervical / Trunk Assessment  Cervical / Trunk Assessment Other exceptions  Cervical / Trunk Exceptions s/p left SI fusion  Bed Mobility  Overal bed mobility Needs Assistance  Bed Mobility Supine to Sit;Sit to Supine  Supine to sit  Supervision  Sit to supine Min assist  General bed mobility comments Supervision for safety. Pt unable to perform log roll, so educated about helicopter technique. Min A for LLE assist to perform bed mobility.   Transfers  Overall transfer level Needs assistance  Equipment used Crutches  Transfers Sit to/from Stand  Sit to Stand Min guard  General transfer comment Min guard for steadying assist.   Ambulation/Gait  Ambulation/Gait assistance Min assist;Min guard  Gait Distance (Feet) 50 Feet  Assistive device Crutches  Gait Pattern/deviations Step-to pattern  General Gait Details Very slow, mildly unsteady gait. Min guard for safety initially, however, required min A once fatigued for steadying. Pt refusing to use RW for ambulation, reporting she felt more comfortable with use of crutches. Very short hop to steps with RLE, despite cues to perform step through. Educated about walking program to perform at home.   Gait velocity Decreased   Balance  Overall balance assessment Needs assistance  Sitting-balance support No upper extremity supported;Feet supported  Sitting balance-Leahy Scale Fair  Standing balance support Bilateral upper extremity supported;During functional activity  Standing balance-Leahy Scale Poor  Standing balance comment Reliant on UE support  General Comments  General comments (skin integrity, edema, etc.) Educated/performed how to maintain precautions during toileting tasks.   PT - End of Session  Equipment Utilized During Treatment Gait belt  Activity Tolerance Patient limited by fatigue  Patient left in bed;with call bell/phone within reach  Nurse Communication Mobility status  PT Assessment  PT Recommendation/Assessment Patient needs continued PT services  PT Visit Diagnosis Unsteadiness on feet (R26.81);Other abnormalities of gait and mobility (R26.89);Pain  Pain - Right/Left Left  Pain - part of body  (SI joint )  PT Problem List Decreased balance;Decreased  activity tolerance;Decreased mobility;Decreased knowledge of use of DME;Decreased knowledge of precautions;Pain  PT Plan  PT Frequency (ACUTE ONLY) Min 5X/week  PT Treatment/Interventions (ACUTE ONLY) DME instruction;Gait training;Stair training;Therapeutic activities;Functional mobility training;Therapeutic exercise;Balance training;Patient/family education  AM-PAC PT "6 Clicks" Daily Activity Outcome Measure  Difficulty turning over in bed (including adjusting bedclothes, sheets and blankets)? 3  Difficulty moving from lying on back to sitting on the side of the bed?  3  Difficulty sitting down on and standing up from a chair with arms (e.g., wheelchair, bedside commode, etc,.)? 1  Help needed moving to and from a bed to chair (including a wheelchair)? 3  Help needed walking in hospital room? 3  Help needed climbing 3-5 steps with a railing?  2  6 Click Score 15  Mobility G Code  CK  PT Recommendation  Follow Up Recommendations No PT follow up;Supervision for mobility/OOB  PT equipment None recommended by PT  Individuals Consulted  Consulted and Agree with Results and Recommendations Patient  Acute Rehab PT Goals  Patient Stated Goal "Return to normal life."  PT Goal Formulation With patient  Time For Goal Achievement 11/11/17  Potential to Achieve Goals Good  PT Time Calculation  PT Start Time (ACUTE ONLY) 1717  PT Stop Time (ACUTE ONLY) 1800  PT Time Calculation (min) (ACUTE ONLY) 43 min  PT General Charges  $$ ACUTE PT VISIT 1 Visit  PT Evaluation  $PT Eval Low Complexity 1 Low  PT Treatments  $Gait Training 8-22 mins  $Therapeutic Activity 8-22 mins  Written Expression  Dominant Hand Right   Patient is s/p above surgery resulting in the deficits listed below (see PT Problem List). Pt with mildly unsteady gait with crutches, requiring min to min guard A for steadying. Educated about RW for increased stability, however, pt refusing at this time. Educated about precautions and  how to maintain throughout mobility and ADL tasks. Patient will benefit from skilled PT to increase their independence and safety with mobility (while adhering to their precautions) to allow discharge to the venue listed below.  [name], PT, DPT  Acute Rehabilitation Services  Pager: [phone]

## 2017-10-28 NOTE — Evaluation (Signed)
Occupational Therapy Evaluation Patient Details Name: [name] MRN: [id number] DOB: [birth year] Today's Date: 10/28/2017    History of Present Illness 37 yo female s/p left SI fusion. PMH including ADHD, anxiety, depression, GERD, and pseudotumor cerebri.    Clinical Impression   PTA, pt was living with her significant other and two kids and was independent. Currently, pt requires Min A for LB ADLs with AE and Min A for functional mobility using crutches. Provided education on back precautions, bed mobility, LB ADLs with AE, and crutch management; pt demonstrated understanding. Pt will need further acute OT to address functional transfers and facilitate safe dc. Recommend dc home once medically stable per physician.     Follow Up Recommendations  No OT follow up;Supervision/Assistance - 24 hour    Equipment Recommendations  None recommended by OT    Recommendations for Other Services PT consult     Precautions / Restrictions Precautions Precautions: Back;Fall Precaution Booklet Issued: No Precaution Comments: Per OP note, NWBing at LLE Restrictions Weight Bearing Restrictions: Yes LLE Weight Bearing: Non weight bearing      Mobility Bed Mobility Overal bed mobility: Needs Assistance Bed Mobility: Rolling;Sit to Sidelying Rolling: Min guard       Sit to sidelying: Min guard General bed mobility comments: Min Guard A for safety. Education on log roll and using RLE to elevate LLE  Transfers Overall transfer level: Needs assistance Equipment used: Crutches Transfers: Sit to/from Stand Sit to Stand: Min assist         General transfer comment: Min A to steady in standing. Education on crutch management    Balance Overall balance assessment: Needs assistance Sitting-balance support: No upper extremity supported;Feet supported Sitting balance-Leahy Scale: Fair     Standing balance support: Bilateral upper extremity supported;During functional  activity Standing balance-Leahy Scale: Poor Standing balance comment: Reliant on UE support                           ADL either performed or assessed with clinical judgement   ADL Overall ADL's : Needs assistance/impaired Eating/Feeding: Set up;Sitting   Grooming: Set up;Sitting   Upper Body Bathing: Set up;Sitting   Lower Body Bathing: Minimal assistance;Sit to/from stand Lower Body Bathing Details (indicate cue type and reason): Min A for safety and balance in standing. Upper Body Dressing : Set up;Sitting Upper Body Dressing Details (indicate cue type and reason): donned short at EOB Lower Body Dressing: Minimal assistance;Sit to/from stand Lower Body Dressing Details (indicate cue type and reason): Min A for standing balance with psoterior lean. Educating pt on use of AE for donning pants and socks. Pt demonstrating understanding.  Toilet Transfer: Minimal assistance;Ambulation(crutches; simulated in room) Toilet Transfer Details (indicate cue type and reason): Min A for balance and stability.    Toileting - Clothing Manipulation Details (indicate cue type and reason): educated pt on compensatory techniques to adhere to back precautions     Functional mobility during ADLs: Minimal assistance(crutches) General ADL Comments: Providing education on back precautions, bed mobility, crutch managment, and LB dressing with AE. Pt demosntratign understanding and very appreciative.      Vision Baseline Vision/History: Wears glasses Wears Glasses: Reading only Patient Visual Report: No change from baseline       Perception     Praxis      Pertinent Vitals/Pain Pain Assessment: Faces Faces Pain Scale: Hurts whole lot Pain Location: LLE Pain Descriptors / Indicators: Shooting Pain Intervention(s): Repositioned;Limited activity within  patient's tolerance;Monitored during session     Hand Dominance Right   Extremity/Trunk Assessment Upper Extremity  Assessment Upper Extremity Assessment: Overall WFL for tasks assessed   Lower Extremity Assessment Lower Extremity Assessment: LLE deficits/detail LLE Deficits / Details: NWBing. s/p left SI fusion   Cervical / Trunk Assessment Cervical / Trunk Assessment: Other exceptions Cervical / Trunk Exceptions: s/p left SI fusion   Communication Communication Communication: No difficulties   Cognition Arousal/Alertness: Awake/alert Behavior During Therapy: WFL for tasks assessed/performed Overall Cognitive Status: Within Functional Limits for tasks assessed                                     General Comments  Significant other present throughout session    Exercises     Shoulder Instructions      Home Living Family/patient expects to be discharged to:: Private residence Living Arrangements: Spouse/significant other Available Help at Discharge: Family;Available PRN/intermittently Type of Home: House             Bathroom Shower/Tub: Tub/shower unit;Walk-in shower   Bathroom Toilet: Handicapped height     Home Equipment: Crutches;Grab bars - toilet;Grab bars - tub/shower;Hand held shower head          Prior Functioning/Environment Level of Independence: Independent        Comments: 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        OT Problem List: Decreased strength;Decreased range of motion;Decreased activity tolerance;Impaired balance (sitting and/or standing);Decreased knowledge of use of DME or AE;Decreased knowledge of precautions;Pain      OT Treatment/Interventions: Self-care/ADL training;Therapeutic exercise;Energy conservation;DME and/or AE instruction;Therapeutic activities;Patient/family education    OT Goals(Current goals can be found in the care plan section) Acute Rehab OT Goals Patient Stated Goal: "Return to normal life." OT Goal Formulation: With patient Time For Goal Achievement: 11/11/17 Potential to Achieve Goals: Good ADL  Goals Pt Will Transfer to Toilet: regular height toilet;ambulating;with supervision Pt Will Perform Toileting - Clothing Manipulation and hygiene: with supervision;sit to/from stand Pt Will Perform Tub/Shower Transfer: with supervision;ambulating;Shower transfer(crutches) Additional ADL Goal #1: Pt will perform bed mobility with supervision in preparation for ADLs  OT Frequency: Min 2X/week   Barriers to D/C:            Co-evaluation              AM-PAC PT "6 Clicks" Daily Activity     Outcome Measure Help from another person eating meals?: None Help from another person taking care of personal grooming?: None Help from another person toileting, which includes using toliet, bedpan, or urinal?: A Little Help from another person bathing (including washing, rinsing, drying)?: A Little Help from another person to put on and taking off regular upper body clothing?: None Help from another person to put on and taking off regular lower body clothing?: A Little 6 Click Score: 21   End of Session Equipment Utilized During Treatment: Other (comment)(Crutches) Nurse Communication: Mobility status;Precautions;Weight bearing status  Activity Tolerance: Patient limited by pain Patient left: in bed;with call bell/phone within reach;with family/visitor present  OT Visit Diagnosis: Unsteadiness on feet (R26.81);Other abnormalities of gait and mobility (R26.89);Muscle weakness (generalized) (M62.81);Pain Pain - Right/Left: Left Pain - part of body: Leg                Time: [id number] OT Time Calculation (min): 36 min Charges:  OT General Charges $OT Visit: 1  Visit OT Evaluation $OT Eval Moderate Complexity: 1 Mod OT Treatments $Self Care/Home Management : 8-22 mins  Havyn Ramo MSOT, OTR/L Acute Rehab Pager: [phone] Office: [phone]  Theodoro Grist Ramani Riva 10/28/2017, 5:17 PM

## 2017-10-28 NOTE — Op Note (Signed)
Operative report  Preoperative diagnosis: Left sacroiliac dysfunction  Postoperative diagnosis same  Operative procedure left sacroiliac fusion  First assistant: Anette Riedelarmen Mayo, PA  Implant: iFuse SI fusion cage.   Top: 7.0 x 50 mm.  Middle: 7.0 x 40 mm.  Bottom: 7.0 x 35 mm graft   indications: This is a very pleasant 37 year old woman who is been having severe debilitating left SI pain for some time now.  Despite physical therapy and injection therapy her pain continues to deteriorate.  SI joint injection on multiple occasions produced 80 to 90% relief of her pain but unfortunately is only temporary.  I have the results of the severe pain and loss in quality of life we elected to proceed with surgery  Operative report: Patient was brought the operating room placed upon the operating table.  After successful induction of general anesthesia and endotracheally patient teds SCDs were applied she was turned onto a chest and pelvic roll.  The arms were placed overhead and all bony prominences well-padded.  The left lateral pelvis was prepped and draped in standard fashion.  Timeout was taken to confirm patient procedure and all other important data.  The boundaries of the sacroiliac joint were then marked on the skin and a small stab incision was made and the guidepin was advanced percutaneously to the lateral aspect of the pelvis.  Once I confirmed satisfactory position in the lateral plane I advanced into the pelvis.  Using the inlet and outlet view I confirmed satisfactory trajectory and advanced the guidepin across the SI joint and into the sacrum.  There was taken not to broach the foramen and to ensure there was no anterior or posterior breach.  Once I confirmed satisfactory trajectory of this first pin I then used the guide to place the second and third pin.  Once all 3 pins were properly placed I confirmed using inlet, outlet, lateral x-rays satisfactory position of the guide pins.  An incision was  made connecting all 3 and then I placed the targeting device over the first pin.  I measured and then broached across the pelvis and across the SI joint.  I then removed the broach and inserted the 50 mm length 7.0 diameter I fused cage.  This had excellent purchase and was well-seated.  I then repeated this at the middle and bottom level placing the appropriate length I fuse devices at each level.  At this point final x-rays demonstrate satisfactory overall placement of all 3 devices.  The wound was then copiously irrigated with normal saline and closed in a layered fashion with interrupted #1 Vicryl suture, 2-0 Vicryl suture, and 3-0 Monocryl for the skin.  Steri-Strips and dry dressing were applied.  40 cc of quarter percent Marcaine with epinephrine mixed with Exparel was then injected for postoperative analgesia.  The end of the case all needle sponge counts were correct.  There are no adverse intraoperative events.  Patient was ultimately transferred to the PACU after extubation.  Patient will be ambulated nonweightbearing with crutches in the PACU and if she is doing well and she needs all appropriate criteria she can be discharged home.  Otherwise she will be admitted for overnight and work with physical therapy and be discharged in the morning.

## 2017-10-29 ENCOUNTER — Encounter (HOSPITAL_COMMUNITY): Payer: Self-pay | Admitting: Orthopedic Surgery

## 2017-10-29 DIAGNOSIS — M533 Sacrococcygeal disorders, not elsewhere classified: Secondary | ICD-10-CM | POA: Diagnosis not present

## 2017-10-29 NOTE — Progress Notes (Signed)
Physical Therapy Treatment  Pt continues to make steady progress towards goals, ambulating with gross min guard for safety with use of RW. Instructed pt in stair negotiation both forwards and backwards with crutch and rail and curb negotiation with RW. Pt unable to ascend stairs backwards with RW, believed to be due to UE weakness, warranting use of crutches in order to get into entrance at d/c. Pt initially demonstrated difficulty with crutch sequencing on stairs requiring assist and cues to regain LOB. Practiced ascending and descending stairs x6, with pt reporting she felt she could safely manage stairs at d/c with husband assisting with gait belt as needed. Educated pt on generalized walking program and car transfers. Recommending pt utilize crutch with steps and RW for ambulation for energy conservation at d/c, with pt in agreement. Will continue to follow acutely and progress as able per POC.     10/29/17 1218  PT Visit Information  Assistance Needed +1  History of Present Illness 37 yo female s/p left SI fusion. PMH including ADHD, anxiety, depression, GERD, and pseudotumor cerebri.   Subjective Data  Patient Stated Goal "Return to normal life."  Precautions  Precautions Back;Fall  Precaution Comments Reviewed precautions in full  Restrictions  Weight Bearing Restrictions Yes  LLE Weight Bearing NWB  Pain Assessment  Faces Pain Scale 4  Pain Location LLE  Pain Descriptors / Indicators Discomfort;Grimacing;Sore  Cognition  Arousal/Alertness Awake/alert  Behavior During Therapy WFL for tasks assessed/performed  Overall Cognitive Status Within Functional Limits for tasks assessed  Bed Mobility  Overal bed mobility Needs Assistance  Bed Mobility Rolling;Sidelying to Sit  Rolling Min guard  Sidelying to sit Min guard  Sit to sidelying Min guard  General bed mobility comments Pt min G for safety; slow and guarded requiring cues in order to perform sequence with proper hand placement.  Pt prefered to perform log roll onto R side due to increased pain onto L side. Able to get out of bed on the R at d/c.   Transfers  Overall transfer level Needs assistance  Equipment used Crutches;Rolling walker (2 wheeled)  Transfers Sit to/from Stand  Sit to Stand Min guard  General transfer comment Pt demonstrated safe hand placement and technique with crutches upon ascent and cues for control with descent to bed with crutches; improved descent to chair with use of RW.   Ambulation/Gait  Ambulation/Gait assistance Min guard  Assistive device Rolling walker (2 wheeled);Crutches  Gait Pattern/deviations Step-to pattern (hop-to pattern)  General Gait Details Pt easily fatigued with crutches with mild unsteadiness noted. Pt demonstrated improved balance and energy conservation with use of RW. Pt demonstrated maintenence of NWB precautions without cues. VCs for pacing when ambulating longer distances.  Gait velocity Decreased   Stairs assistance Min assist;Mod assist  General stair comments  Pt descended backwards and ascended forwards from curb step x3, with min cues for safety with RW and min guard. Pt requested to try stairs in stairwell with crutches as steps she must negotiate at d/c are higher. Pt ascended forwads with use of rail on the left and crutch on right side with cues for sequencing of crutch. With descent backwards pt attempted to lower foot to ground before bringing crutch down first and demonstrated LOB requiring max A in order to recover. Cues required to squat with R LE and lower crutch onto surface descending to prior to descent. After 3 trials pt reported that she felt comfortable ascending forwards and descending backwards with crutch and rail.  Practiced descent forwards with crutch and rail, with pt requiring cues for sequencing with crutch prior to initiating descent. Pt reported she felt safe negotiating stairs with multiple trials and did not need to practice further.   Balance  Overall balance assessment Needs assistance  Sitting-balance support No upper extremity supported;Feet supported  Sitting balance-Leahy Scale Good  Standing balance support Single extremity supported;Bilateral upper extremity supported;During functional activity  Standing balance-Leahy Scale Fair  Standing balance comment Able to stand statically with use of at least one UE support. Requires bil UE support for dynamic standing activities.  PT - End of Session  Equipment Utilized During Treatment Gait belt  Activity Tolerance Patient tolerated treatment well  Patient left in chair;with call bell/phone within reach;with family/visitor present  Nurse Communication Mobility status   PT - Assessment/Plan  PT Plan Current plan remains appropriate  PT Visit Diagnosis Unsteadiness on feet (R26.81);Other abnormalities of gait and mobility (R26.89);Pain  Pain - Right/Left Left  Pain - part of body  (SI joint)  PT Frequency (ACUTE ONLY) Min 5X/week  Follow Up Recommendations No PT follow up;Supervision for mobility/OOB  PT equipment None recommended by PT  AM-PAC PT "6 Clicks" Daily Activity Outcome Measure  Difficulty turning over in bed (including adjusting bedclothes, sheets and blankets)? 3  Difficulty moving from lying on back to sitting on the side of the bed?  3  Difficulty sitting down on and standing up from a chair with arms (e.g., wheelchair, bedside commode, etc,.)? 1  Help needed moving to and from a bed to chair (including a wheelchair)? 3  Help needed walking in hospital room? 3  Help needed climbing 3-5 steps with a railing?  3  6 Click Score 16  Mobility G Code  CK  PT Goal Progression  Progress towards PT goals Progressing toward goals  Acute Rehab PT Goals  PT Goal Formulation With patient  Time For Goal Achievement 11/11/17  Potential to Achieve Goals Good  PT Time Calculation  PT Start Time (ACUTE ONLY) 0752  PT Stop Time (ACUTE ONLY) 0836  PT Time  Calculation (min) (ACUTE ONLY) 44 min  PT General Charges  $$ ACUTE PT VISIT 1 Visit  PT Treatments  $Gait Training 38-52 mins   Donzetta KohutKaylee Juan Kissoon, [place]  Student Physical Therapist Acute Rehab [phone]

## 2017-10-29 NOTE — Progress Notes (Signed)
    Subjective: 1 Day Post-Op Procedure(s) (LRB): SACROILIAC JOINT FUSION (Left) Patient reports pain as 4 on 0-10 scale.   Denies CP or SOB.  Voiding without difficulty. Positive flatus. Objective: Vital signs in last 24 hours: Temp:  [97.8 F (36.6 C)-98.4 F (36.9 C)] 97.8 F (36.6 C) (08/29 0327) Pulse Rate:  [76-109] 97 (08/29 0327) Resp:  [8-21] 20 (08/29 0327) BP: (105-123)/(64-75) 105/69 (08/29 0327) SpO2:  [96 %-100 %] 99 % (08/29 0327) Weight:  [95.3 kg] 95.3 kg (08/28 1522)  Intake/Output from previous day: 08/28 0701 - 08/29 0700 In: 1320 [P.O.:120; I.V.:1200] Out: 10 [Blood:10] Intake/Output this shift: No intake/output data recorded.  Labs: No results for input(s): HGB in the last 72 hours. No results for input(s): WBC, RBC, HCT, PLT in the last 72 hours. No results for input(s): NA, K, CL, CO2, BUN, CREATININE, GLUCOSE, CALCIUM in the last 72 hours. No results for input(s): LABPT, INR in the last 72 hours.  Physical Exam: Neurologically intact ABD soft Sensation intact distally Dorsiflexion/Plantar flexion intact Incision: scant drainage Compartment soft Body mass index is 34.95 kg/m.   Assessment/Plan: 1 Day Post-Op Procedure(s) (LRB): SACROILIAC JOINT FUSION (Left) Advance diet Up with therapy  Pt may Dc home after cleared by PT Post op meds on chart Pt will be non weight bearing LLE for 6 weeks  Shauntel Prest, Baxter Kailarmen Christina for Dr. Venita Lickahari Brooks [facility] [phone] 10/29/2017, 7:28 AM

## 2017-10-29 NOTE — Progress Notes (Signed)
Patient is discharged from room 3C09 at this time. Alert and in stable condition. IV site d/c'd and instructions read to patient and spouse with understanding verbalized. Left unit via wheelchair with all belongings at side. 

## 2017-10-29 NOTE — Progress Notes (Signed)
Occupational Therapy Treatment Patient Details Name: Margaret FridayWhitney J Ferguson MRN: [id number] DOB: 03-Jun-1980 Today's Date: 10/29/2017    History of present illness 37 yo female s/p left SI fusion. PMH including ADHD, anxiety, depression, GERD, and pseudotumor cerebri.    OT comments  Pt progressing toward OT goals with increased safety and independence with functional mobility this session. Reviewed walk in shower transfer with RW vs. Crutches. Extensive d/c planning with pt and family re fall prevention and safety.   Follow Up Recommendations  No OT follow up;Supervision/Assistance - 24 hour    Equipment Recommendations  None recommended by OT    Recommendations for Other Services PT consult    Precautions / Restrictions Precautions Precautions: Back;Fall Precaution Comments: Reviewed precautions in full Restrictions Weight Bearing Restrictions: Yes LLE Weight Bearing: Non weight bearing       Mobility Bed Mobility Overal bed mobility: Needs Assistance Bed Mobility: Sit to Sidelying;Rolling;Sidelying to Sit Rolling: Min assist Sidelying to sit: Supervision Supine to sit: Supervision Sit to supine: Min assist Sit to sidelying: Min guard General bed mobility comments: vc for technique  Transfers Overall transfer level: Needs assistance Equipment used: Crutches;Rolling walker (2 wheeled) Transfers: Sit to/from Stand Sit to Stand: Min guard         General transfer comment: good carryover of AE use    Balance Overall balance assessment: Needs assistance Sitting-balance support: No upper extremity supported;Feet supported Sitting balance-Leahy Scale: Good     Standing balance support: Bilateral upper extremity supported;During functional activity Standing balance-Leahy Scale: Fair Standing balance comment: Reliant on UE support                           ADL either performed or assessed with clinical judgement   ADL Overall ADL's : Needs  assistance/impaired                                 Tub/ Shower Transfer: Minimal assistance;Cueing for safety;Cueing for sequencing;Stand-pivot;Shower seat;Rolling walker;Grab Investment banker, operationalbars Tub/Shower Transfer Details (indicate cue type and reason): extensive edu re methods of getting in and out of shower and AE/AD to use Functional mobility during ADLs: Min guard;Rolling walker General ADL Comments: Provided edu re ADL transfers and crutches vs. RW for functional mobility    Cognition Arousal/Alertness: Awake/alert Behavior During Therapy: WFL for tasks assessed/performed Overall Cognitive Status: Within Functional Limits for tasks assessed                           Pertinent Vitals/ Pain       Pain Assessment: 0-10 Pain Score: 4  Faces Pain Scale: Hurts little more Pain Location: LLE Pain Descriptors / Indicators: Discomfort;Grimacing;Sore Pain Intervention(s): Monitored during session         Frequency  Min 2X/week        Progress Toward Goals  OT Goals(current goals can now be found in the care plan section)  Progress towards OT goals: Progressing toward goals  Acute Rehab OT Goals Patient Stated Goal: "Return to normal life." OT Goal Formulation: With patient Time For Goal Achievement: 11/11/17 Potential to Achieve Goals: Good  Plan Discharge plan remains appropriate    Co-evaluation                 AM-PAC PT "6 Clicks" Daily Activity     Outcome Measure   Help from another person eating  meals?: None Help from another person taking care of personal grooming?: None Help from another person toileting, which includes using toliet, bedpan, or urinal?: A Little Help from another person bathing (including washing, rinsing, drying)?: A Little Help from another person to put on and taking off regular upper body clothing?: None Help from another person to put on and taking off regular lower body clothing?: A Little 6 Click Score: 21    End  of Session Equipment Utilized During Treatment: Rolling walker;Other (comment)(crutches)  OT Visit Diagnosis: Unsteadiness on feet (R26.81);Other abnormalities of gait and mobility (R26.89);Muscle weakness (generalized) (M62.81);Pain Pain - Right/Left: Left Pain - part of body: Leg   Activity Tolerance Patient tolerated treatment well   Patient Left in bed;with call bell/phone within reach;with family/visitor present   Nurse Communication Mobility status        Time: 1006-1050 OT Time Calculation (min): 44 min  Charges: OT General Charges $OT Visit: 1 Visit OT Treatments $Self Care/Home Management : 38-52 mins   Crissie Reese OTR/L 10/29/2017, 10:57 AM
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
## 2017-11-09 ENCOUNTER — Encounter: Payer: Self-pay | Admitting: Nurse Practitioner

## 2017-11-09 ENCOUNTER — Ambulatory Visit: Payer: BLUE CROSS/BLUE SHIELD | Admitting: Nurse Practitioner

## 2017-11-09 VITALS — BP 124/87 | HR 107 | Ht 64.0 in | Wt 203.0 lb

## 2017-11-09 DIAGNOSIS — G932 Benign intracranial hypertension: Secondary | ICD-10-CM | POA: Diagnosis not present

## 2017-11-09 DIAGNOSIS — IMO0002 Reserved for concepts with insufficient information to code with codable children: Secondary | ICD-10-CM

## 2017-11-09 DIAGNOSIS — G43709 Chronic migraine without aura, not intractable, without status migrainosus: Secondary | ICD-10-CM | POA: Diagnosis not present

## 2017-11-09 DIAGNOSIS — Z6841 Body Mass Index (BMI) 40.0 and over, adult: Secondary | ICD-10-CM

## 2017-11-09 MED ORDER — SUMATRIPTAN SUCCINATE 6 MG/0.5ML ~~LOC~~ SOLN: 6.0000 mg | SUBCUTANEOUS | 2 refills | Status: DC | PRN

## 2017-11-09 MED ORDER — RIZATRIPTAN BENZOATE 10 MG PO TBDP: 10.0000 mg | ORAL_TABLET | ORAL | 11 refills | Status: DC | PRN

## 2017-11-09 NOTE — Progress Notes (Signed)
I have reviewed and agreed above plan. 

## 2017-11-09 NOTE — Progress Notes (Signed)
Sumatriptan injection refill Rx faxed to [facility].

## 2017-11-09 NOTE — Patient Instructions (Signed)
Continue Maxalt acutely will refill May use Imitrex for severe headaches  Will  refill Follow-up in 1 year

## 2017-11-11 NOTE — Discharge Summary (Signed)
Physician Discharge Summary  Patient ID: [name] MRN: [id number] DOB/AGE: 03-08-1980 37 y.o.  Admit date: 10/28/2017 Discharge date: 10/29/17  Admission Diagnoses:  Sacroiliac Joint Dysfunction  Discharge Diagnoses:  Active Problems:   S/P lumbar fusion   Past Medical History:  Diagnosis Date  . ADHD (attention deficit hyperactivity disorder)   . ADHD, adult residual type   . Anxiety   . Depression   . Family history of adverse reaction to anesthesia    sister has PONV  . GERD (gastroesophageal reflux disease)   . Headache    otc med prn - last one 2 months ago  . 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 Pseudotumor cerebri     Surgeries: Procedure(s): SACROILIAC JOINT FUSION on 10/28/2017   Consultants (if any):   Discharged Condition: Improved  Hospital Course: [name] is an 37 y.o. female who was admitted 10/28/2017 with a diagnosis of Left SI joint dysfunction and went to the operating room on 10/28/2017 and underwent the above named procedures.  Post op day one pt reports moderate level of pain controlled on oral medication.  Pt is voiding w/o difficulty.  Pt has worked with PT pre and post op as she will be non weight bearing on the left for 6 weeks. Pt cleared for DC from OT/PT.   She was given perioperative antibiotics:  Anti-infectives (From admission, onward)   Start     Dose/Rate Route Frequency Ordered Stop   10/28/17 2000  vancomycin (VANCOCIN) 1,250 mg in sodium chloride 0.9 % 250 mL IVPB     1,250 mg 166.7 mL/hr over 90 Minutes Intravenous  Once 10/28/17 1542 10/28/17 2115   10/28/17 1500  fluconazole (DIFLUCAN) tablet 200 mg     200 mg Oral  Once 10/28/17 1342 10/28/17 1644   10/28/17 0726  vancomycin (VANCOCIN) IVPB 1000 mg/200 mL premix     1,000 mg 200 mL/hr over 60 Minutes Intravenous 60 min pre-op 10/28/17 0726 10/28/17 1535    .  She was given sequential compression devices, early ambulation, and TED for DVT  prophylaxis.  She benefited maximally from the hospital stay and there were no complications.    Recent vital signs:  Vitals:   10/29/17 0327 10/29/17 0735  BP: 105/69 (!) 116/57  Pulse: 97 77  Resp: 20 20  Temp: 97.8 F (36.6 C) 98.1 F (36.7 C)  SpO2: 99% 98%    Recent laboratory studies:  Lab Results  Component Value Date   HGB 13.2 10/19/2017   HGB 12.6 05/10/2016   HGB 11.6 (L) 02/15/2016   Lab Results  Component Value Date   WBC 7.2 10/19/2017   PLT 296 10/19/2017   Lab Results  Component Value Date   INR 1.04 05/10/2016   Lab Results  Component Value Date   NA 138 10/19/2017   K 4.7 10/19/2017   CL 106 10/19/2017   CO2 24 10/19/2017   BUN 18 10/19/2017   CREATININE 0.73 10/19/2017   GLUCOSE 80 10/19/2017    Discharge Medications:   Allergies as of 10/29/2017      Reactions   Aspirin Other (See Comments)   Stomach pain Severe abdominal pain   Lorazepam Other (See Comments)   "Can't remember anything while on it" aggressive    Naproxen Nausea And Vomiting, Other (See Comments)   cramping   Penicillins Hives   Has patient had a PCN reaction causing immediate rash, facial/tongue/throat swelling, SOB or lightheadedness with hypotension: No Has patient had  a PCN reaction causing severe rash involving mucus membranes or skin necrosis: No Has patient had a PCN reaction that required hospitalization No Has patient had a PCN reaction occurring within the last 10 years: No If all of the above answers are "NO", then may proceed with Cephalosporin use.   Buspar [buspirone] Other (See Comments)   "felt weird, heart raced, cloudy"   Macrobid [nitrofurantoin Monohyd Macro] Other (See Comments)   Stomach pain   Sudafed [pseudoephedrine Hcl] Hives   Topiramate Other (See Comments)   Numbness/tingling in mouth/feet      Medication List    STOP taking these medications   HYDROcodone-acetaminophen 10-325 MG tablet Commonly known as:  NORCO     TAKE these  medications   amphetamine-dextroamphetamine 10 MG 24 hr capsule Commonly known as:  ADDERALL XR Take 10 mg by mouth daily.   clonazePAM 1 MG tablet Commonly known as:  KLONOPIN Take 1 mg by mouth at bedtime. May take an additional 1 mg dose as needed for anxiety   diphenhydrAMINE 25 MG tablet Commonly known as:  BENADRYL Take 50 mg by mouth daily as needed for allergies.   FLUoxetine 10 MG tablet Commonly known as:  PROZAC Take 10 mg by mouth daily.   hydrOXYzine 25 MG tablet Commonly known as:  ATARAX/VISTARIL Take 1-2 tablets (25-50 mg total) by mouth every 6 (six) hours as needed for anxiety.   methocarbamol 750 MG tablet Commonly known as:  ROBAXIN Take 750 mg by mouth 3 (three) times daily as needed for muscle spasms.   MOVANTIK 12.5 MG Tabs tablet Generic drug:  naloxegol oxalate Take 12.5 mg by mouth daily.   ondansetron 8 MG disintegrating tablet Commonly known as:  ZOFRAN-ODT Take 8 mg by mouth every 8 (eight) hours as needed for nausea or vomiting.   promethazine 25 MG tablet Commonly known as:  PHENERGAN Take 1 tablet (25 mg total) by mouth every 6 (six) hours as needed for nausea or vomiting.     ASK your doctor about these medications   oxyCODONE-acetaminophen 10-325 MG tablet Commonly known as:  PERCOCET Take 1 tablet by mouth every 6 (six) hours as needed for up to 5 days for pain. Ask about: Should I take this medication?       Diagnostic Studies: Dg Si Joints  Result Date: 10/28/2017 CLINICAL DATA:  37 year old female with a history sacroiliac dysfunction EXAM: BILATERAL SACROILIAC JOINTS - 3+ VIEW; DG C-ARM 61-120 MIN COMPARISON:  None. FINDINGS: Limited intraoperative fluoroscopic spot images of left sacroiliac arthrodesis. IMPRESSION: Limited intraoperative fluoroscopic spot images of left-sided sacroiliac arthrodesis. Electronically Signed   By: Gilmer Mor D.O.   On: 10/28/2017 13:19   Dg C-arm 1-60 Min  Result Date: 10/28/2017 CLINICAL  DATA:  37 year old female with a history sacroiliac dysfunction EXAM: BILATERAL SACROILIAC JOINTS - 3+ VIEW; DG C-ARM 61-120 MIN COMPARISON:  None. FINDINGS: Limited intraoperative fluoroscopic spot images of left sacroiliac arthrodesis. IMPRESSION: Limited intraoperative fluoroscopic spot images of left-sided sacroiliac arthrodesis. Electronically Signed   By: Gilmer Mor D.O.   On: 10/28/2017 13:19    Disposition:  Post op meds provided Pt will present to clinic in 2 weeks  Discharge Instructions    Incentive spirometry RT   Complete by:  As directed       Follow-up Information    Venita Lick, MD Follow up in 2 week(s).   Specialty:  Orthopedic Surgery Contact information: [address] [place] [address] [phone]  Signed: Kirt Boys 11/11/2017, 8:07 AM

## 2017-12-16 ENCOUNTER — Other Ambulatory Visit: Payer: Self-pay | Admitting: Orthopedic Surgery

## 2017-12-16 ENCOUNTER — Ambulatory Visit
Admission: RE | Admit: 2017-12-16 | Discharge: 2017-12-16 | Disposition: A | Discharge status: Home / Self Care | Payer: BLUE CROSS/BLUE SHIELD | Source: Ambulatory Visit | Attending: Orthopedic Surgery | Admitting: Orthopedic Surgery

## 2017-12-16 DIAGNOSIS — M533 Sacrococcygeal disorders, not elsewhere classified: Secondary | ICD-10-CM

## 2017-12-18 ENCOUNTER — Other Ambulatory Visit: Payer: Self-pay | Admitting: Neurology

## 2018-01-11 ENCOUNTER — Other Ambulatory Visit: Payer: Self-pay | Admitting: Neurology

## 2018-01-12 IMAGING — XA IR FLUORO GUIDE NDL PLMT / BX
2 series · 6 of 6 positions shown · non-contrast
Comparison: 05/07/2016

CLINICAL DATA: Positional headache since lumbar puncture last week.
Unresponsive to conservative therapy.

EXAM:
LUMBAR EPIDURAL BLOOD PATCH

[Series 1: fl angio · 2 of 2 slices shown (1 of 2)]
[im 1/2]
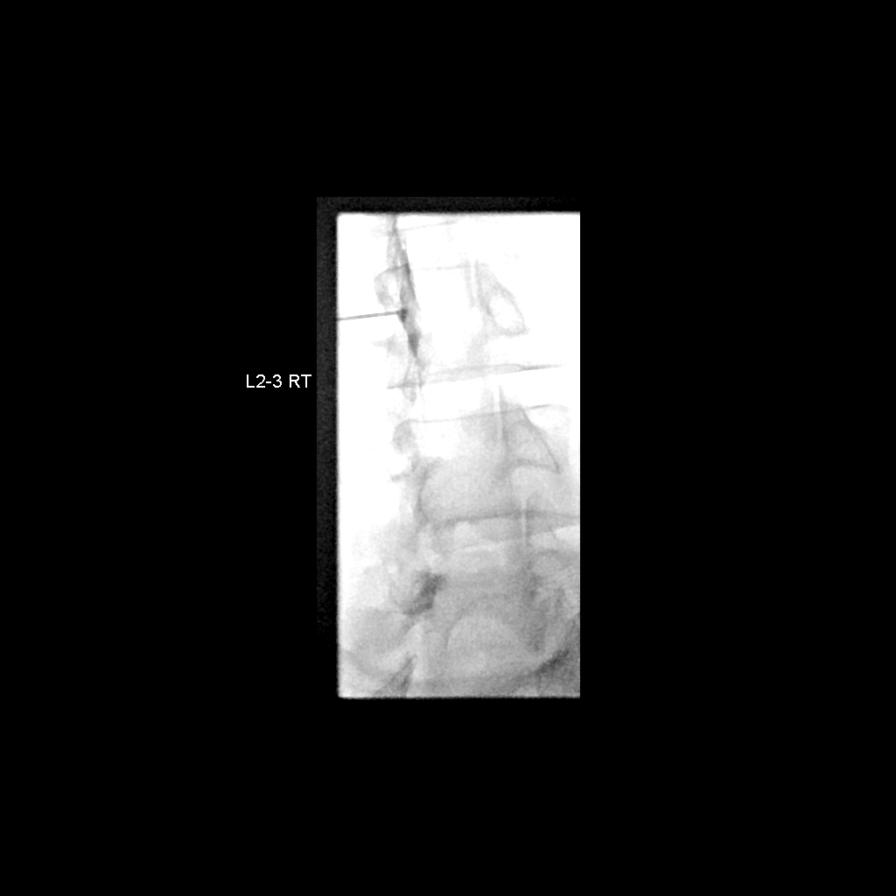
[im 2/2]
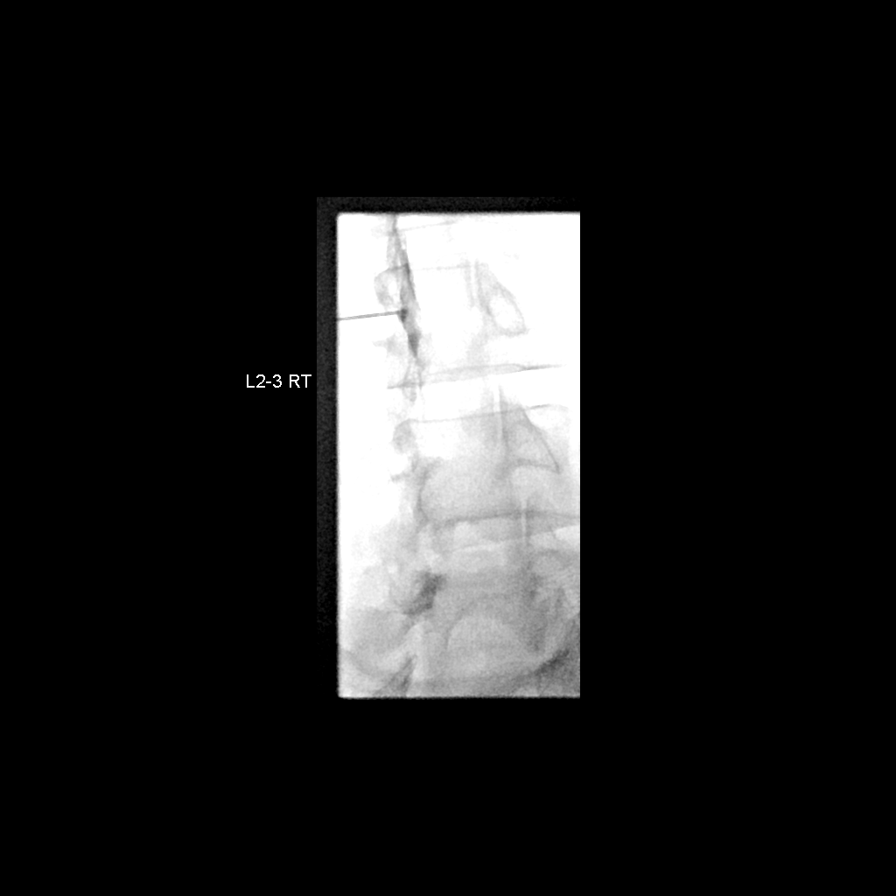

[Series 2: fl angio · 4 of 4 slices shown (2 of 2)]
[im 1/4]
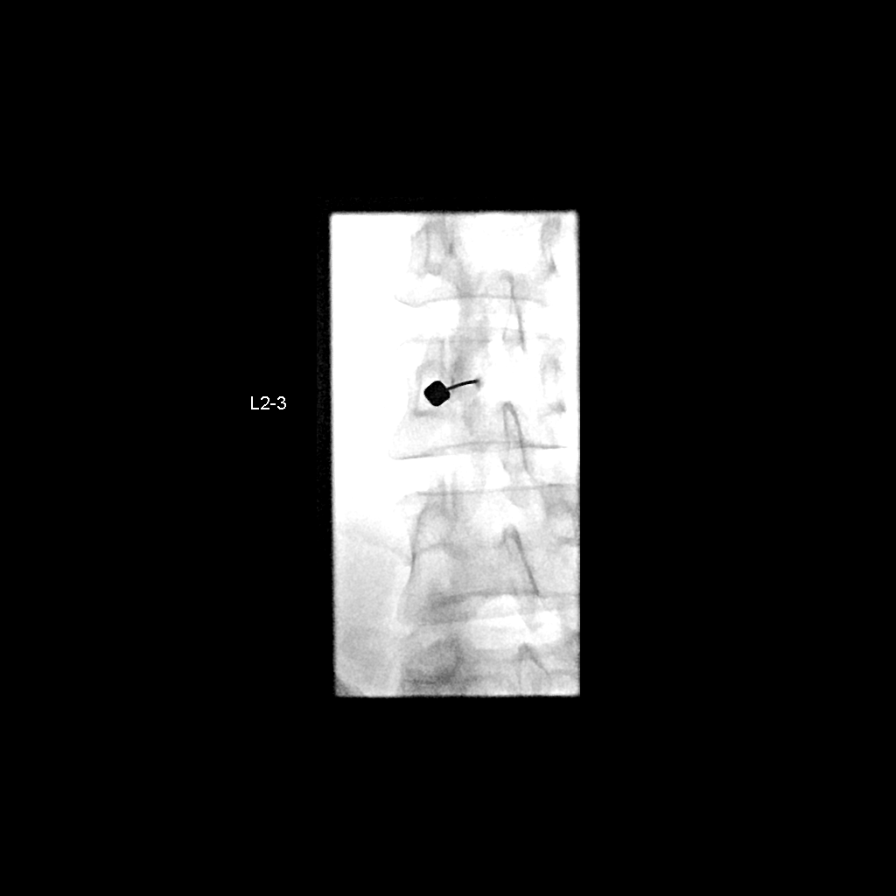
[im 2/4]
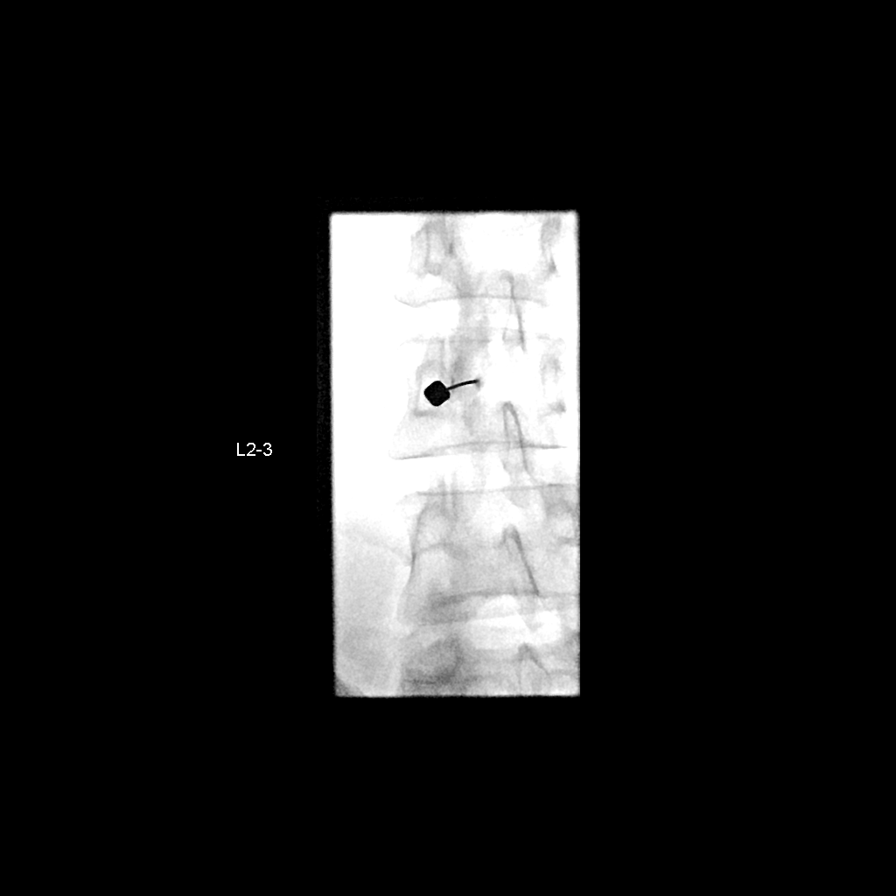
[im 3/4]
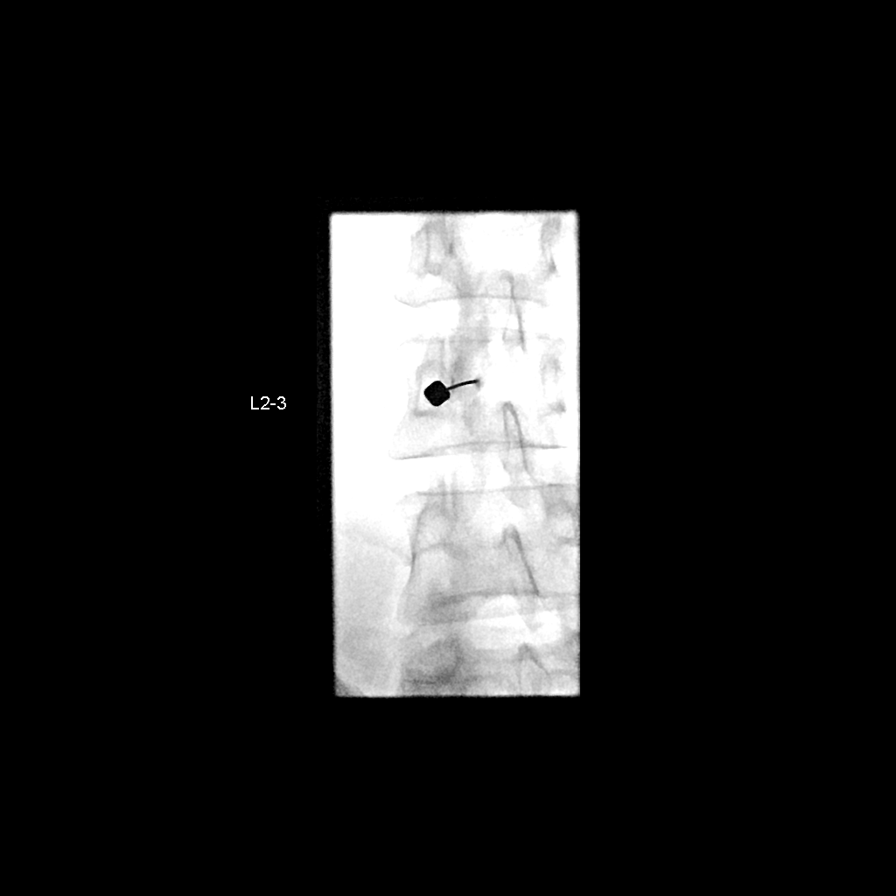
[im 4/4]
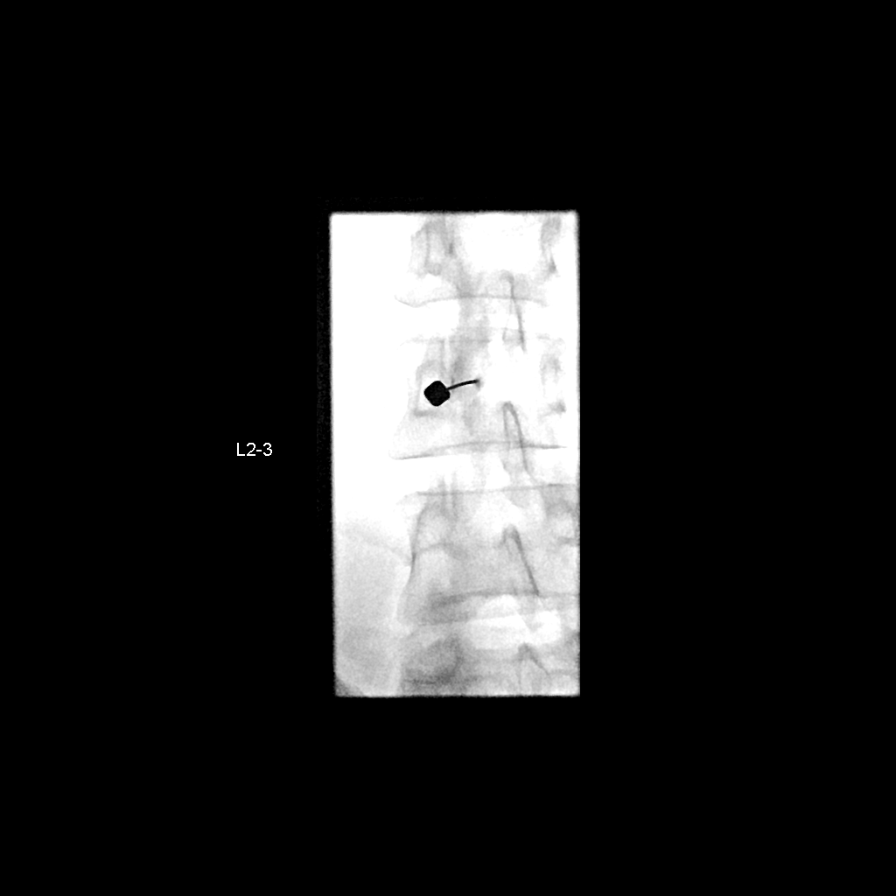

[6 of 6 positions shown; findings below may reference images not displayed]

PROCEDURE:
The procedure, risks, benefits, and alternatives were explained to
the patient. Questions regarding the procedure were encouraged and
answered. The patient understands and consents to the procedure.

15 cc of blood were withdrawn from the patient's right antecubital
fossa by myself. The skin at the prior LP site was scrubbed with
Betadine and draped in sterile fashion. Skin anesthesia was carried
[DATE]% Lidocaine. An epidural approach was taken on the left
at L2-3 using a 20 gauge spinal needle. Epidural positioning was
confirmed by injecting a small amount of [id number]. There was no
vascular communication. 15 cc of the patient's blood was slowly
injected into the epidural space in this location. The procedure was
well-tolerated and the patient was returned to the floor in good
condition with orders to lie down for an additional day.

FLUOROSCOPY TIME:  134.44 micro gray meter squared. 0 minutes 24
seconds.
IMPRESSION: Lumbar epidural blood patch on the left at L2-3.

## 2018-02-17 DIAGNOSIS — E611 Iron deficiency: Secondary | ICD-10-CM | POA: Insufficient documentation

## 2018-02-18 ENCOUNTER — Other Ambulatory Visit: Payer: Self-pay

## 2018-02-18 ENCOUNTER — Encounter (HOSPITAL_COMMUNITY): Payer: Self-pay

## 2018-02-18 ENCOUNTER — Emergency Department (HOSPITAL_COMMUNITY)
Admission: EM | Admit: 2018-02-18 | Discharge: 2018-02-18 | Disposition: A | Discharge status: Home / Self Care | Payer: BLUE CROSS/BLUE SHIELD | Attending: Emergency Medicine | Admitting: Emergency Medicine

## 2018-02-18 DIAGNOSIS — F419 Anxiety disorder, unspecified: Secondary | ICD-10-CM

## 2018-02-18 DIAGNOSIS — D649 Anemia, unspecified: Secondary | ICD-10-CM

## 2018-02-18 DIAGNOSIS — D509 Iron deficiency anemia, unspecified: Secondary | ICD-10-CM | POA: Insufficient documentation

## 2018-02-18 DIAGNOSIS — Z79899 Other long term (current) drug therapy: Secondary | ICD-10-CM | POA: Diagnosis not present

## 2018-02-18 LAB — BASIC METABOLIC PANEL
ANION GAP: 9 (ref 5–15)
BUN: 13 mg/dL (ref 6–20)
CALCIUM: 8.9 mg/dL (ref 8.9–10.3)
CHLORIDE: 109 mmol/L (ref 98–111)
CO2: 25 mmol/L (ref 22–32)
CREATININE: 0.71 mg/dL (ref 0.44–1.00)
GFR calc non Af Amer: >60 mL/min (ref >60)
Glucose, Bld: 98 mg/dL (ref 70–99)
Potassium: 4 mmol/L (ref 3.5–5.1)
SODIUM: 143 mmol/L (ref 135–145)

## 2018-02-18 LAB — CBC
HCT: 37.1 % (ref 36.0–46.0)
HEMOGLOBIN: 11.8 g/dL — AB (ref 12.0–15.0)
MCH: 31.6 pg (ref 26.0–34.0)
MCHC: 31.8 g/dL (ref 30.0–36.0)
MCV: 99.5 fL (ref 80.0–100.0)
NRBC: 0 % (ref 0.0–0.2)
Platelets: 310 10*3/uL (ref 150–400)
RBC: 3.73 MIL/uL — ABNORMAL LOW (ref 3.87–5.11)
RDW: 13.1 % (ref 11.5–15.5)
WBC: 7.7 10*3/uL (ref 4.0–10.5)

## 2018-02-18 NOTE — ED Provider Notes (Signed)
8:45 PM-patient not in room.  EKG leads on bed, IV cannula on bedside table.  Patient was not seen by me.  She left before evaluation.   Mancel BaleWentz, Ramie Palladino, MD 02/18/18 [phone]

## 2018-02-18 NOTE — ED Provider Notes (Signed)
[facility] EMERGENCY DEPARTMENT Provider Note   CSN: [id number] Arrival date & time: 02/18/18  1916     History   Chief Complaint Chief Complaint  Patient presents with  . Anxiety  . Anemia    HPI [name] is a 37 y.o. female.  HPI  She presents for an iron infusion.  States she went to her infusion center at [facility], checked again, and then was told that they could not do an iron infusion today.  After that she reportedly went to the attached emergency department where she was told that they could not do an infusion.  She subsequently came here and checked in.  Labs were done at triage and she had a delay in presentation to the examination room.  When I saw her, she was alert and cooperative and interactive.  She explained that she did not have time to get the infusion done within the next 3 weeks, so wanted to have an infusion done here tonight.  I immediately explained that we do not do iron infusions at this facility, and talk to her about the available labs that were in the computer including labs done at triage as well as labs done at her facility, about 1 month ago.  Patient explained that she has generalized weakness.  He states that not getting the infusion today caused her to feel very anxious, and have a panic attack. She did not have any other concerns about iron deficiency or complaints.  There are no other known modifying factors.    Past Medical History:  Diagnosis Date  . ADHD (attention deficit hyperactivity disorder)   . ADHD, adult residual type   . Anxiety   . Depression   . Family history of adverse reaction to anesthesia    sister has PONV  . GERD (gastroesophageal reflux disease)   . Headache    otc med prn - last one 2 months ago  . 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 Pseudotumor cerebri     Patient Active Problem List   Diagnosis Date Noted  . S/P lumbar fusion 10/28/2017  . Headache  03/10/2017  . 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 Intractable chronic migraine without aura and with status migrainosus 05/09/2016  . Pseudotumor cerebri 05/06/2016  . Obesity 05/06/2016  . Chronic migraine 05/06/2016  . Pelvic pain in female 06/19/2015    Past Surgical History:  Procedure Laterality Date  . ABDOMINAL HYSTERECTOMY    . CESAREAN SECTION     x 2  . CYSTOSCOPY N/A 06/19/2015   Procedure: CYSTOSCOPY;  Surgeon: Jaymes Graff, MD;  Location: [facility];  Service: Gynecology;  Laterality: N/A;  . GASTRECTOMY     sleeve, [facility]  . IR GENERIC HISTORICAL  05/12/2016   IR FLUORO GUIDED NEEDLE PLC ASPIRATION/INJECTION LOC 05/12/2016 Paulina Fusi, MD [facility]-INTER[facility] RAD  . LAPAROSCOPIC VAGINAL HYSTERECTOMY WITH SALPINGECTOMY Bilateral 06/19/2015   Procedure: LAPAROSCOPIC ASSISTED VAGINAL HYSTERECTOMY WITH SALPINGECTOMY;  Surgeon: Jaymes Graff, MD;  Location: [facility] OR[facility];  Service: Gynecology;  Laterality: Bilateral;  . lumbar pucture     x2  . SACROILIAC JOINT FUSION Left 10/28/2017   Procedure: SACROILIAC JOINT FUSION;  Surgeon: Venita Lick, MD;  Location: [facility] OR;  Service: Orthopedics;  Laterality: Left;  90 mins  . TONSILLECTOMY       OB History    Gravida  2   Para  2   Term  2   Preterm  AB      Living  2     SAB      TAB      Ectopic      Multiple      Live Births               Home Medications    Prior to Admission medications   Medication Sig Start Date End Date Taking? Authorizing Provider  amphetamine-dextroamphetamine (ADDERALL XR) 10 MG 24 hr capsule Take 10 mg by mouth daily.    [provider]  clonazePAM (KLONOPIN) 1 MG tablet Take 1 mg by mouth at bedtime. May take an additional 1 mg dose as needed for anxiety 05/05/16 10/13/17  [provider]  diphenhydrAMINE (BENADRYL) 25 MG tablet Take 50 mg by mouth daily as needed for allergies.    [provider]  FLUoxetine (PROZAC) 10 MG tablet Take 10 mg by  mouth daily.    [provider]  hydrOXYzine (ATARAX/VISTARIL) 25 MG tablet Take 1-2 tablets (25-50 mg total) by mouth every 6 (six) hours as needed for anxiety. 10/25/17   Dione BoozeGlick, David, MD  methocarbamol (ROBAXIN) 750 MG tablet Take 750 mg by mouth 3 (three) times daily as needed for muscle spasms.    [provider]  MOVANTIK 12.5 MG TABS tablet Take 12.5 mg by mouth daily. 10/07/17   [provider]  ondansetron (ZOFRAN-ODT) 8 MG disintegrating tablet Take 8 mg by mouth every 8 (eight) hours as needed for nausea or vomiting.     [provider]  promethazine (PHENERGAN) 25 MG tablet Take 1 tablet (25 mg total) by mouth every 6 (six) hours as needed for nausea or vomiting. 07/07/16   Levert FeinsteinYan, Yijun, MD  promethazine (PHENERGAN) 25 MG tablet TAKE 1 TABLET EVERY SIX HOURS AS NEEDED FOR NAUSEA OR VOMITING. 12/18/17   Levert FeinsteinYan, Yijun, MD  promethazine (PHENERGAN) 25 MG tablet TAKE 1 TABLET EVERY SIX HOURS AS NEEDED FOR NAUSEA OR VOMITING. 01/11/18   Levert FeinsteinYan, Yijun, MD  rizatriptan (MAXALT-MLT) 10 MG disintegrating tablet Take 1 tablet (10 mg total) by mouth as needed for migraine. May repeat in 2 hours if needed 11/09/17   Nilda RiggsMartin, Nancy Carolyn, NP  SUMAtriptan (IMITREX) 6 MG/0.5ML SOLN injection Inject 0.5 mLs (6 mg total) into the skin every 2 (two) hours as needed for migraine or headache. May repeat in 2 hours if headache persists or recurs. 11/09/17   Nilda RiggsMartin, Nancy Carolyn, NP    Family History Family History  Problem Relation Age of Onset  . Pneumonia Mother        Sepsis  . Heart attack Father     Social History Social History   Tobacco Use  . Smoking status: Never Smoker  . Smokeless tobacco: Never Used  Substance Use Topics  . Alcohol use: Yes    Comment: occasional liquor  . Drug use: No     Allergies   Aspirin; Lorazepam; Naproxen; Penicillins; Buspar [buspirone]; Macrobid [nitrofurantoin monohyd macro]; Sudafed [pseudoephedrine hcl]; and  Topiramate   Review of Systems Review of Systems  All other systems reviewed and are negative.    Physical Exam Updated Vital Signs BP (!) 148/104 (BP Location: Right Arm)   Pulse 82   Temp 97.8 F (36.6 C) (Oral)   Resp 16   LMP 06/12/2015 Comment: negative pregnancy test result today.  SpO2 100%   Physical Exam Vitals signs and nursing note reviewed.  Constitutional:      Appearance: She is well-developed. She is  not ill-appearing.  HENT:     Head: Normocephalic and atraumatic.     Right Ear: External ear normal.     Left Ear: External ear normal.  Eyes:     Conjunctiva/sclera: Conjunctivae normal.     Pupils: Pupils are equal, round, and reactive to light.  Neck:     Musculoskeletal: Normal range of motion.     Trachea: Phonation normal.  Cardiovascular:     Rate and Rhythm: Normal rate.  Pulmonary:     Effort: Pulmonary effort is normal.  Musculoskeletal: Normal range of motion.  Skin:    General: Skin is warm and dry.  Neurological:     Mental Status: She is alert and oriented to person, place, and time.     Cranial Nerves: No cranial nerve deficit.     Sensory: No sensory deficit.     Motor: No abnormal muscle tone.     Coordination: Coordination normal.  Psychiatric:        Behavior: Behavior normal.        Thought Content: Thought content normal.        Judgment: Judgment normal.     Comments: Anxious, tearful at times      ED Treatments / Results  Labs (all labs ordered are listed, but only abnormal results are displayed) Labs Reviewed  CBC - Abnormal; Notable for the following components:      Result Value   RBC 3.73 (*)    Hemoglobin 11.8 (*)    All other components within normal limits  BASIC METABOLIC PANEL    EKG None  Radiology No results found.  Procedures Procedures (including critical care time)  Medications Ordered in ED Medications - No data to display   Initial Impression / Assessment and Plan / ED Course  I have  reviewed the triage vital signs and the nursing notes.  Pertinent labs & imaging results that were available during my care of the patient were reviewed by me and considered in my medical decision making (see chart for details).  Clinical Course as of Feb 18 2222  Thu Feb 18, 2018  2206 Patient has now arrived in the treatment room and is ready to be seen.   [EW]    Clinical Course User Index [EW] Mancel Bale, MD     Patient Vitals for the past 24 hrs:  BP Temp Temp src Pulse Resp SpO2  02/18/18 1941 (!) 148/104 97.8 F (36.6 C) Oral 82 16 100 %    10:20 PM Reevaluation with update and discussion. After initial assessment and treatment, an updated evaluation reveals no change in clinical status.  I attempted to graciously explain our inability to do an iron infusion from the emergency department.  Patient was upset, but stated "okay."  I apologize for any inconvenience that she may have experienced during the ED stay.  Findings discussed with the patient and all questions were answered. Mancel Bale   Medical Decision Making: Patient with low ferritin, level 15, on 01/15/2018.  Was down from 23, in February 2019.  CBC reviewed from that date, with hemoglobin 12.4.  Only available red cell indices was RDW which was low at 12.1.  Hemoglobin today, 11.8.  There is no clinical evidence for hemodynamic stability.  There is no indication that she needs blood transfusion, at this time.  CRITICAL CARE-no Performed by: Mancel Bale  Nursing Notes Reviewed/ Care Coordinated Applicable Imaging Reviewed Interpretation of Laboratory Data incorporated into ED treatment  The patient appears reasonably  screened and/or stabilized for discharge and I doubt any other medical condition or other [id number] requiring further screening, evaluation, or treatment in the ED at this time prior to discharge.  Plan: Home Medications-continue usual medications; Home Treatments-rest; return here if the recommended  treatment, does not improve the symptoms; Recommended follow up-PCP and bariatric physician, as needed, and suggested that they arrange another iron infusion as soon as possible.    Final Clinical Impressions(s) / ED Diagnoses   Final diagnoses:  Anxiety  Anemia, unspecified type    ED Discharge Orders    None       Mancel BaleWentz, Shyann Hefner, MD 02/18/18 2224

## 2018-02-18 NOTE — ED Triage Notes (Signed)
Pt here from home with anxiety and said she was trying to get to [facility] infusion center [facility] for [facility] iron infusion.  Pt alert and oriented.  She was been trying to calm herself down and has not been able to yet.  Pt is followed at [facility] with bariatric surgery services.

## 2018-02-18 NOTE — Discharge Instructions (Addendum)
Follow-up with your PCP or GI physician to arrange an iron infusion when you are able.

## 2018-07-21 ENCOUNTER — Other Ambulatory Visit: Payer: Self-pay | Admitting: Neurology

## 2018-08-19 ENCOUNTER — Other Ambulatory Visit: Payer: Self-pay | Admitting: Neurology

## 2018-08-19 ENCOUNTER — Encounter: Payer: Self-pay | Admitting: *Deleted

## 2018-09-16 ENCOUNTER — Other Ambulatory Visit: Payer: Self-pay | Admitting: *Deleted

## 2018-09-16 MED ORDER — PROMETHAZINE HCL 25 MG PO TABS: ORAL_TABLET | ORAL | 2 refills | Status: DC

## 2018-10-20 ENCOUNTER — Telehealth: Payer: Self-pay | Admitting: Neurology

## 2018-10-20 NOTE — Telephone Encounter (Signed)
Patient reports a 4 day Hx of increase in her migraines. She called the after hours call center; I spoke to her: she states, that she had a recent change in her antidepressant medication per PCP, now on Pristiq, no longer on Cymbalta, she reports having a reaction to the Cymbalta. She has tried all her meds for migraine. She denies any one sided weakness or numbness. She was wondering if she needs a "migraine cocktail". She was advised that if she felt bad enough and could not wait till tomorrow, she may have to go to ER. She also believes, she has a couple of new blisters, similar to the reaction to Cymbalta and was wondering if she is developing a reaction to the Pristiq. She was advised to contact her prescribing physician's office immediately regarding further guidance with regards to the [place]. If she has an acute, serious reaction, she has to go to ER. I told her, I would notify Dr. Krista Blue and her nurse. I could not promise her that there would be an opening tomorrow for an appt or infusion appt in our office. She has her yearly appt scheduled for Sept with Dr. Krista Blue. There may be a chance to move it up. She is, however, advised to seek more immediate medical attention if she could not wait till tomorrow. She demonstrated understanding and agreement.

## 2018-10-21 NOTE — Telephone Encounter (Signed)
I spoke to the patient this morning.  She decided to go to [facility] last night for migraine treatment.  She is feeling better this morning.  She does report a recent increase in frequency and has moved her yearly follow up to 10/25/2018 with Butler Denmark, NP.
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
## 2018-10-25 ENCOUNTER — Ambulatory Visit: Payer: Self-pay | Admitting: Neurology

## 2018-10-25 ENCOUNTER — Encounter: Payer: Self-pay | Admitting: Neurology

## 2018-10-25 ENCOUNTER — Telehealth: Payer: Self-pay
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
## 2018-10-25 NOTE — Telephone Encounter (Signed)
Patient was a no call/no show for their appointment today.   

## 2018-10-26 ENCOUNTER — Encounter: Payer: Self-pay | Admitting: Neurology

## 2018-10-26 ENCOUNTER — Ambulatory Visit: Payer: BC Managed Care – PPO | Admitting: Neurology

## 2018-10-26 ENCOUNTER — Other Ambulatory Visit: Payer: Self-pay

## 2018-10-26 VITALS — BP 133/95 | HR 92 | Temp 97.1°F | Ht 64.0 in | Wt 219.2 lb

## 2018-10-26 DIAGNOSIS — G932 Benign intracranial hypertension: Secondary | ICD-10-CM | POA: Diagnosis not present

## 2018-10-26 DIAGNOSIS — G43709 Chronic migraine without aura, not intractable, without status migrainosus: Secondary | ICD-10-CM | POA: Diagnosis not present

## 2018-10-26 DIAGNOSIS — IMO0002 Reserved for concepts with insufficient information to code with codable children: Secondary | ICD-10-CM

## 2018-10-26 MED ORDER — ZONISAMIDE 100 MG PO CAPS: ORAL_CAPSULE | ORAL | 3 refills | Status: DC

## 2018-10-26 MED ORDER — RIZATRIPTAN BENZOATE 10 MG PO TBDP: 10.0000 mg | ORAL_TABLET | ORAL | 11 refills | Status: DC | PRN

## 2018-10-26 MED ORDER — SUMATRIPTAN SUCCINATE 6 MG/0.5ML ~~LOC~~ SOLN: 6.0000 mg | SUBCUTANEOUS | 11 refills | Status: DC | PRN

## 2018-10-26 NOTE — Patient Instructions (Signed)
1. Start Zonegran 100 mg at bedtime x 1 week, then increase to 1 tablet twice a day 2. Continue Maxalt, Imitrex injections as needed  3. Please call your eye doctor to get an eye exam  4. Come back in 3 months

## 2018-11-01 DIAGNOSIS — Z79899 Other long term (current) drug therapy: Secondary | ICD-10-CM | POA: Insufficient documentation

## 2018-11-01 NOTE — Progress Notes (Signed)
I have reviewed and agreed above plan. 

## 2018-11-11 ENCOUNTER — Ambulatory Visit: Payer: BLUE CROSS/BLUE SHIELD | Admitting: Neurology

## 2019-01-05 ENCOUNTER — Other Ambulatory Visit: Payer: Self-pay | Admitting: Neurology

## 2019-01-24 ENCOUNTER — Telehealth: Payer: Self-pay | Admitting: Neurology

## 2019-01-24 NOTE — Telephone Encounter (Signed)
lvm for pt to call back and r/s 12/8 appt- please r/s with NP °

## 2019-02-08 ENCOUNTER — Ambulatory Visit: Payer: BC Managed Care – PPO | Admitting: Neurology

## 2019-02-15 ENCOUNTER — Encounter: Payer: Self-pay | Admitting: Neurology

## 2019-02-15 ENCOUNTER — Other Ambulatory Visit: Payer: Self-pay

## 2019-02-15 ENCOUNTER — Ambulatory Visit: Payer: BC Managed Care – PPO | Admitting: Neurology

## 2019-02-15 VITALS — BP 106/67 | HR 85 | Temp 97.2°F | Ht 64.0 in | Wt 201.0 lb

## 2019-02-15 DIAGNOSIS — G43709 Chronic migraine without aura, not intractable, without status migrainosus: Secondary | ICD-10-CM

## 2019-02-15 DIAGNOSIS — G932 Benign intracranial hypertension: Secondary | ICD-10-CM

## 2019-02-15 DIAGNOSIS — IMO0002 Reserved for concepts with insufficient information to code with codable children: Secondary | ICD-10-CM

## 2019-02-15 MED ORDER — TIZANIDINE HCL 4 MG PO TABS: 4.0000 mg | ORAL_TABLET | Freq: Four times a day (QID) | ORAL | 6 refills | Status: DC | PRN

## 2019-02-15 MED ORDER — AIMOVIG 70 MG/ML ~~LOC~~ SOAJ: 70.0000 mg | SUBCUTANEOUS | 11 refills | Status: DC

## 2019-02-15 MED ORDER — ONDANSETRON 4 MG PO TBDP: 4.0000 mg | ORAL_TABLET | Freq: Three times a day (TID) | ORAL | 6 refills | Status: DC | PRN

## 2019-02-15 MED ORDER — CAMBIA 50 MG PO PACK: 50.0000 mg | PACK | Freq: Once | ORAL | 11 refills | Status: DC | PRN

## 2019-02-15 MED ORDER — ELETRIPTAN HYDROBROMIDE 40 MG PO TABS: 40.0000 mg | ORAL_TABLET | ORAL | 11 refills | Status: DC | PRN

## 2019-02-15 NOTE — Progress Notes (Signed)
PATIENT: [name] DOB: [birth year]  REASON FOR VISIT: follow up HISTORY FROM: patient    HISTORY OF PRESENT ILLNESS:Pascale J Scarlettis a 38 years old right-handed female, accompanied by her husband [name], seen in refer by her primary care physician nurse practitioner [name] for evaluation of pseudotumor cerebri, initial evaluation was on May 06 2016.  I saw her in 2011, She had a history of hypertension during pregnancy, postpartum depression, frequent headaches postpartum day, there was mild evidence of papillary edema on examination, she was referred for CT head without contrast that was normal in December 2011, lumbar puncture on February 06 2010, patient reported elevated open pressure 44 cm water, she later developed difficulty urination, low back pain, had MRI of lumbar on February 08 2010, that was normal.  I reviewed and summarized the referring note, reported a history of ADHD since age 75, only tolerate Adderall extended release, anxiety, currently taking Wellbutrin 300 mg daily, recently started on BuSpar 15 mg twice a day, Klonopin 1 mg twice a day as needed, also taking instant release Adderall 5 mg one tablet in the afternoon  [employer] has lost follow-up since 2011, today she came in complains of gradual worsening headaches since 2016, now she has daily left occipital headache, spreading forward to become left retro-orbital area severe pounding headache with associated light noise sensitivity, nauseous, she went to emergency room about 6 times over the past 12 months for headache treatment, she also had steady weight gain, complains of transient difficulty focusing with sudden body positional change. She denies tinnitus, no hearing loss.   UPDATE Jul 07 2016:YY She had a fluoroscopy guided lumbar puncture on May 07 2016, open pressure was 23 cm water, closing pressure was 12 cm water.  Post LP she developed severe positional related headache,  require hospital admission, was treated with Morphine as needed, also received scheduled Percocet, Fioricet. She eventually had a blood patch on March twelfth 2018.  She could not tolerate Topamax, she complains of bilateral feet neuropathic pain, numbness tingling, also complains of alternating of the taste especially with soda, and her mouth feel raw, she could not tolerate it, she was put on Zonegran 100 mg twice a day since and of March 2018, she could not tolerate it better, she noticed improvement of her headaches.  Over the past 2 months, on average he has one or two headache each week, left-sided lateralized severe pounding headache with associated light noise sensitivity, nauseous, she is now taking Maxalt 10 mg, sometimes Fioricet as needed, this will usually take care of the headache within one hour.  she is planning on to have bariatric surgery soon.have bariatric evaluation Jul 28 2016.  UPDATE October 29 2016:  Lumbar puncture in March 2018 showed open pressure of 23 cm water, Laboratory evaluation showed normal BMP, INR, CBC, negative HIV,  She toleratedZonegran well,if she is compliance with it, her migraine would be under better control. She is planning on to have bariatric surgery.Imitrex injection as needed works well for her, she used average 6 injection every month.   She went to nutritional consultation, has lost 8 pounds over past 2 weeks,  UPDATE Feb 15 2019:  She stopped the Zonegran shortly after her weight loss surgery in September 2018, she lost more than 50 pounds,  Only recently she began to have increased migraine headaches again, couple times each week, starting at the neck, spreading forward become right retro-orbital area headache with associated light noise sensitivity, nauseous,  lasting for few hours, Maxalt dissolvable provide limited help, she rely on Imitrex injection, which helps but not 100%, she also noticed significant burning pain with each  injection, is very hesitant to inject  She denies visual change, weight has been stable, was followed by optometrist every year, there was no new concerns raised  With previous frequent headaches, pseudotumor cerebri, she was treated with Topamax, complains of unbearable side effect, numbness tingling, change of the taste, Diamox, also complains of discomfort in her tongue, feel acid all the time  She is also dealing with depression, fibromyalgia, with recent change to Lexapro  She was started on Zonegran since August 2020, with some improvement of her headaches, but she still have couple headaches each week,   REVIEW OF SYSTEMS: Out of a complete 14 system review of symptoms, the patient complains only of the following symptoms, and all other reviewed systems are negative. As above ALLERGIES: Allergies  Allergen Reactions  . Aspirin Other (See Comments)    Stomach pain Severe abdominal pain  . Lorazepam Other (See Comments)    "Can't remember anything while on it" aggressive   . Naproxen Nausea And Vomiting and Other (See Comments)    cramping  . Penicillins Hives    Has patient had a PCN reaction causing immediate rash, facial/tongue/throat swelling, SOB or lightheadedness with hypotension: No Has patient had a PCN reaction causing severe rash involving mucus membranes or skin necrosis: No Has patient had a PCN reaction that required hospitalization No Has patient had a PCN reaction occurring within the last 10 years: No If all of the above answers are "NO", then may proceed with Cephalosporin use.  . Duloxetine Other (See Comments)    blisters  . Buspar [Buspirone] Other (See Comments)    "felt weird, heart raced, cloudy"  . Macrobid [employer] Macro] Other (See Comments)    Stomach pain  . Sudafed [Pseudoephedrine Hcl] Hives  . Topiramate Other (See Comments)    Numbness/tingling in mouth/feet    HOME MEDICATIONS: Outpatient Medications Prior to Visit    Medication Sig Dispense Refill  . diphenhydrAMINE (BENADRYL) 25 MG tablet Take 50 mg by mouth daily as needed for allergies.    Marland Kitchen escitalopram (LEXAPRO) 20 MG tablet Take 20 mg by mouth daily.    . hydrochlorothiazide (MICROZIDE) 12.5 MG capsule Take 12.5 mg by mouth as needed.    . methocarbamol (ROBAXIN) 750 MG tablet Take 750 mg by mouth 3 (three) times daily as needed for muscle spasms.    Marland Kitchen MOVANTIK 12.5 MG TABS tablet Take 12.5 mg by mouth daily.  2  . oxyCODONE-acetaminophen (PERCOCET) 10-325 MG tablet Take 1 tablet by mouth 3 (three) times daily.    . promethazine (PHENERGAN) 25 MG tablet TAKE 1 TABLET EVERY SIX HOURS AS NEEDED FOR NAUSEA OR VOMITING. #30 for 30 day. 30 tablet 3  . rizatriptan (MAXALT-MLT) 10 MG disintegrating tablet Take 1 tablet (10 mg total) by mouth as needed for migraine. May repeat in 2 hours if needed 10 tablet 11  . SUMAtriptan (IMITREX) 6 MG/0.5ML SOLN injection Inject 0.5 mLs (6 mg total) into the skin every 2 (two) hours as needed for migraine or headache. May repeat in 2 hours if headache persists or recurs. 6 mL 11  . zonisamide (ZONEGRAN) 100 MG capsule Start taking 1 tablet at bedtime x 1 week, then take 1 in the morning, 1 in the evening 60 capsule 3  . amphetamine-dextroamphetamine (ADDERALL XR) 10 MG 24 hr capsule  Take 10 mg by mouth daily.    . clonazePAM (KLONOPIN) 1 MG tablet Take 1 mg by mouth at bedtime. May take an additional 1 mg dose as needed for anxiety    . FLUoxetine (PROZAC) 10 MG tablet Take 10 mg by mouth daily.    . hydrOXYzine (ATARAX/VISTARIL) 25 MG tablet Take 1-2 tablets (25-50 mg total) by mouth every 6 (six) hours as needed for anxiety. (Patient not taking: Reported on 10/26/2018) 40 tablet 0  . ondansetron (ZOFRAN-ODT) 8 MG disintegrating tablet Take 8 mg by mouth every 8 (eight) hours as needed for nausea or vomiting.      No facility-administered medications prior to visit.    PAST MEDICAL HISTORY: Past Medical History:   Diagnosis Date  . ADHD (attention deficit hyperactivity disorder)   . ADHD, adult residual type   . Anxiety   . Depression   . Family history of adverse reaction to anesthesia    sister has PONV  . GERD (gastroesophageal reflux disease)   . Headache    otc med prn - last one 2 months ago  . 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 Pseudotumor cerebri     PAST SURGICAL HISTORY: Past Surgical History:  Procedure Laterality Date  . ABDOMINAL HYSTERECTOMY    . CESAREAN SECTION     x 2  . CYSTOSCOPY N/A 06/19/2015   Procedure: CYSTOSCOPY;  Surgeon: Jaymes Graff, MD;  Location: [facility];  Service: Gynecology;  Laterality: N/A;  . GASTRECTOMY     sleeve, [facility]  . IR GENERIC HISTORICAL  05/12/2016   IR FLUORO GUIDED NEEDLE PLC ASPIRATION/INJECTION LOC 05/12/2016 Paulina Fusi, MD [facility]  . LAPAROSCOPIC VAGINAL HYSTERECTOMY WITH SALPINGECTOMY Bilateral 06/19/2015   Procedure: LAPAROSCOPIC ASSISTED VAGINAL HYSTERECTOMY WITH SALPINGECTOMY;  Surgeon: Jaymes Graff, MD;  Location: [facility];  Service: Gynecology;  Laterality: Bilateral;  . lumbar pucture     x2  . SACROILIAC JOINT FUSION Left 10/28/2017   Procedure: SACROILIAC JOINT FUSION;  Surgeon: Venita Lick, MD;  Location[facility];  Service: Orthopedics;  Laterality: Left;  90 mins  . TONSILLECTOMY      FAMILY HISTORY: Family History  Problem Relation Age of Onset  . Pneumonia Mother        Sepsis  . Heart attack Father     SOCIAL HISTORY: Social History   Socioeconomic History  . Marital status: Married    Spouse name: Not on file  . Number of children: 2  . Years of education: cosmetology  . Highest education level: Not on file  Occupational History  . Occupation: Homemaker  Tobacco Use  . Smoking status: Never Smoker  . Smokeless tobacco: Never Used  Substance and Sexual Activity  . Alcohol use: Yes    Comment: occasional liquor  . Drug use: No  . Sexual activity: Yes    Birth control/protection:  None    Comment: Husband had vasectomy  Other Topics Concern  . Not on file  Social History Narrative   Lives at home with her husband and children.   Right-handed.   3-4 cups caffeine per day.   Social Determinants of Health   Financial Resource Strain:   . Difficulty of Paying Living Expenses: Not on file  Food Insecurity:   . Worried About Programme researcher, broadcasting/film/video in the Last Year: Not on file  . Ran Out of Food in the Last Year: Not on file  Transportation Needs:   . Lack of Transportation (Medical): Not on  file  . Lack of Transportation (Non-Medical): Not on file  Physical Activity:   . Days of Exercise per Week: Not on file  . Minutes of Exercise per Session: Not on file  Stress:   . Feeling of Stress : Not on file  Social Connections:   . Frequency of Communication with Friends and Family: Not on file  . Frequency of Social Gatherings with Friends and Family: Not on file  . Attends Religious Services: Not on file  . Active Member of Clubs or Organizations: Not on file  . Attends BankerClub or Organization Meetings: Not on file  . Marital Status: Not on file  Intimate Partner Violence:   . Fear of Current or Ex-Partner: Not on file  . Emotionally Abused: Not on file  . Physically Abused: Not on file  . Sexually Abused: Not on file    PHYSICAL EXAM  Vitals:   02/15/19 1000  BP: 106/67  Pulse: 85  Temp: (!) 97.2 F (36.2 C)  Weight: 201 lb (91.2 kg)  Height: 5\' 4"  (1.626 m)   Body mass index is 34.5 kg/m.  Generalized: Well developed, in no acute distress Cardiac: Regular rate rhythm Pulmonary: Clear to auscultation bilaterally Neck: Supple, no carotid bruits  Neurological examination  Mentation: Alert oriented to time, place, history taking. Follows all commands speech and language fluent Cranial nerve II-XII: Pupils were equal round reactive to light. Extraocular movements were full, visual field were full on confrontational test. Facial sensation and strength  were normal.  Head turning and shoulder shrug  were normal and symmetric. Motor: Normal tone bulk and strength Sensory: Intact to light touch Coordination: No limb or truncal ataxia Gait and station: Gait is normal. Reflexes: Deep tendon reflexes are symmetric bilaterally.   DIAGNOSTIC DATA (LABS, IMAGING, TESTING) - I reviewed patient records, labs, notes, testing and imaging myself where available.  Lab Results  Component Value Date   WBC 7.7 02/18/2018   HGB 11.8 (L) 02/18/2018   HCT 37.1 02/18/2018   MCV 99.5 02/18/2018   PLT 310 02/18/2018      Component Value Date/Time   NA 143 02/18/2018 2006   K 4.0 02/18/2018 2006   CL 109 02/18/2018 2006   CO2 25 02/18/2018 2006   GLUCOSE 98 02/18/2018 2006   BUN 13 02/18/2018 2006   CREATININE 0.71 02/18/2018 2006   CALCIUM 8.9 02/18/2018 2006   PROT 7.6 12/11/2010 2035   ALBUMIN 4.0 12/11/2010 2035   AST 21 12/11/2010 2035   ALT 22 12/11/2010 2035   ALKPHOS 86 12/11/2010 2035   BILITOT 0.2 (L) 12/11/2010 2035   GFRNONAA >60 02/18/2018 2006   GFRAA >60 02/18/2018 2006   No results found for: CHOL, HDL, LDLCALC, LDLDIRECT, TRIG, CHOLHDL No results found for: HKVQ2VHGBA1C No results found for: VITAMINB12 No results found for: TSH  CT scan of the brain 10/21/2018 IMPRESSION:  No acute intracranial abnormality identified.  ASSESSMENT AND PLAN 38 y.o. year old female  1.  History of pseudotumor cerebri 2.  Chronic migraine  Still have frequent headaches despite Zonegran 100 mg twice a day  Will add on Aimovig 70 mg every month as preventive medications  Continue follow-up with her optometrist  She reported suboptimal response to Maxalt, Imitrex tablets, will try Relpax, Cambia as needed   Levert FeinsteinYijun Kambryn Dapolito, M.D. Ph.D.  [facility] [address] [place], [place] [address] Phone: [phone] Fax:      [phone]

## 2019-02-16 ENCOUNTER — Telehealth: Payer: Self-pay | Admitting: *Deleted

## 2019-02-16 NOTE — Telephone Encounter (Signed)
Pt has coverage with [employer] - [id number].   PA for Aimovig and Cambia started through covermymeds:  1) Aimovig - key: [id number] - approved immediately through 05/16/2019.  2) Cambia - key: [id number] - decision pending

## 2019-02-17 MED ORDER — CAMBIA 50 MG PO PACK: 50.0000 mg | PACK | Freq: Once | ORAL | 11 refills | Status: DC | PRN

## 2019-02-17 NOTE — Telephone Encounter (Signed)
[name] is now approved through her insurance plan.  Valid through 02/15/2020.  Her plan will allow #9 packets per month.  New rx sent to pharmacy reflecting the quantity limits.

## 2019-02-17 NOTE — Addendum Note (Signed)
Addended by: Noberto Retort C on: 02/17/2019 04:26 PM   Modules accepted: Orders

## 2019-05-04 ENCOUNTER — Telehealth: Payer: Self-pay | Admitting: *Deleted

## 2019-05-04 NOTE — Telephone Encounter (Signed)
PA for Aimovig 70mg  started on covermymeds (key: [id number]). Pt has pharmacy coverage with [employer] [phone]). Decision pending.

## 2019-05-05 NOTE — Telephone Encounter (Signed)
PA approved through 05/02/2020.

## 2019-05-19 ENCOUNTER — Ambulatory Visit: Payer: BC Managed Care – PPO | Admitting: Neurology

## 2019-05-19 NOTE — Progress Notes (Deleted)
PATIENT: [name] DOB: 29-Aug-1980  REASON FOR VISIT: follow up HISTORY FROM: patient  HISTORY OF PRESENT ILLNESS: Today 05/19/19  HISTORY    HISTORY OF PRESENT ILLNESS:Margaret J Scarlettis a 39 years old right-handed female, accompanied by her husband [name], seen in refer by her primary care physician nurse practitioner [name] for evaluation of pseudotumor cerebri, initial evaluation was on May 06 2016.  I saw her in 2011, She had a history of hypertension during pregnancy, postpartum depression, frequent headaches postpartum day, there was mild evidence of papillary edema on examination, she was referred for CT head without contrast that was normal in December 2011, lumbar puncture on February 06 2010, patient reported elevated open pressure 44 cm water, she later developed difficulty urination, low back pain, had MRI of lumbar on February 08 2010, that was normal.  I reviewed and summarized the referring note, reported a history of ADHD since age 60, only tolerate Adderall extended release, anxiety, currently taking Wellbutrin 300 mg daily, recently started on BuSpar 15 mg twice a day, Klonopin 1 mg twice a day as needed, also taking instant release Adderall 5 mg one tablet in the afternoon  [employer] has lost follow-up since 2011, today she came in complains of gradual worsening headaches since 2016, now she has daily left occipital headache, spreading forward to become left retro-orbital area severe pounding headache with associated light noise sensitivity, nauseous, she went to emergency room about 6 times over the past 12 months for headache treatment, she also had steady weight gain, complains of transient difficulty focusing with sudden body positional change. She denies tinnitus, no hearing loss.   UPDATE Jul 07 2016:YY She had a fluoroscopy guided lumbar puncture on May 07 2016, open pressure was 23 cm water, closing pressure was 12 cm  water.  Post LP she developed severe positional related headache, require hospital admission, was treated with Morphine as needed, also received scheduled Percocet, Fioricet. She eventually had a blood patch on March twelfth 2018.  She could not tolerate Topamax, she complains of bilateral feet neuropathic pain, numbness tingling, also complains of alternating of the taste especially with soda, and her mouth feel raw, she could not tolerate it, she was put on Zonegran 100 mg twice a day since and of March 2018, she could not tolerate it better, she noticed improvement of her headaches.  Over the past 2 months, on average he has one or two headache each week, left-sided lateralized severe pounding headache with associated light noise sensitivity, nauseous, she is now taking Maxalt 10 mg, sometimes Fioricet as needed, this will usually take care of the headache within one hour.  she is planning on to have bariatric surgery soon.have bariatric evaluation Jul 28 2016.  UPDATE October 29 2016:  Lumbar puncture in March 2018 showed open pressure of 23 cm water, Laboratory evaluation showed normal BMP, INR, CBC, negative HIV,  She toleratedZonegran well,if she is compliance with it, her migraine would be under better control. She is planning on to have bariatric surgery.Imitrex injection as needed works well for her, she used average 6 injection every month.   She went to nutritional consultation, has lost 8 pounds over past 2 weeks,  UPDATE Feb 15 2019:  She stopped the Zonegran shortly after her weight loss surgery in September 2018, she lost more than 50 pounds,  Only recently she began to have increased migraine headaches again, couple times each week, starting at the neck, spreading forward become right  retro-orbital area headache with associated light noise sensitivity, nauseous, lasting for few hours, Maxalt dissolvable provide limited help, she rely on Imitrex injection, which  helps but not 100%, she also noticed significant burning pain with each injection, is very hesitant to inject  She denies visual change, weight has been stable, was followed by optometrist every year, there was no new concerns raised  With previous frequent headaches, pseudotumor cerebri, she was treated with Topamax, complains of unbearable side effect, numbness tingling, change of the taste, Diamox, also complains of discomfort in her tongue, feel acid all the time  She is also dealing with depression, fibromyalgia, with recent change to Lexapro  She was started on Zonegran since August 2020, with some improvement of her headaches, but she still have couple headaches each week,  Update May 19, 2019 SS: When last seen Aimovig was added  REVIEW OF SYSTEMS: Out of a complete 14 system review of symptoms, the patient complains only of the following symptoms, and all other reviewed systems are negative.  ALLERGIES: Allergies  Allergen Reactions  . Aspirin Other (See Comments)    Stomach pain Severe abdominal pain  . Lorazepam Other (See Comments)    "Can't remember anything while on it" aggressive   . Naproxen Nausea And Vomiting and Other (See Comments)    cramping  . Penicillins Hives    Has patient had a PCN reaction causing immediate rash, facial/tongue/throat swelling, SOB or lightheadedness with hypotension: No Has patient had a PCN reaction causing severe rash involving mucus membranes or skin necrosis: No Has patient had a PCN reaction that required hospitalization No Has patient had a PCN reaction occurring within the last 10 years: No If all of the above answers are "NO", then may proceed with Cephalosporin use.  . Duloxetine Other (See Comments)    blisters  . Buspar [Buspirone] Other (See Comments)    "felt weird, heart raced, cloudy"  . Macrobid [employer] Macro] Other (See Comments)    Stomach pain  . Sudafed [Pseudoephedrine Hcl] Hives  .  Topiramate Other (See Comments)    Numbness/tingling in mouth/feet    HOME MEDICATIONS: Outpatient Medications Prior to Visit  Medication Sig Dispense Refill  . Diclofenac Potassium,Migraine, (CAMBIA) 50 MG PACK Take 50 mg by mouth once as needed for up to 1 dose. 9 each 11  . diphenhydrAMINE (BENADRYL) 25 MG tablet Take 50 mg by mouth daily as needed for allergies.    Marland Kitchen eletriptan (RELPAX) 40 MG tablet Take 1 tablet (40 mg total) by mouth as needed for migraine or headache. May repeat in 2 hours if headache persists or recurs. 12 tablet 11  . Erenumab-aooe (AIMOVIG) 70 MG/ML SOAJ Inject 70 mg into the skin every 30 (thirty) days. 1 pen 11  . escitalopram (LEXAPRO) 20 MG tablet Take 20 mg by mouth daily.    . hydrochlorothiazide (MICROZIDE) 12.5 MG capsule Take 12.5 mg by mouth as needed.    . methocarbamol (ROBAXIN) 750 MG tablet Take 750 mg by mouth 3 (three) times daily as needed for muscle spasms.    Marland Kitchen MOVANTIK 12.5 MG TABS tablet Take 12.5 mg by mouth daily.  2  . ondansetron (ZOFRAN ODT) 4 MG disintegrating tablet Take 1 tablet (4 mg total) by mouth every 8 (eight) hours as needed. 20 tablet 6  . oxyCODONE-acetaminophen (PERCOCET) 10-325 MG tablet Take 1 tablet by mouth 3 (three) times daily.    . promethazine (PHENERGAN) 25 MG tablet TAKE 1 TABLET EVERY SIX HOURS  AS NEEDED FOR NAUSEA OR VOMITING. #30 for 30 day. 30 tablet 3  . rizatriptan (MAXALT-MLT) 10 MG disintegrating tablet Take 1 tablet (10 mg total) by mouth as needed for migraine. May repeat in 2 hours if needed 10 tablet 11  . SUMAtriptan (IMITREX) 6 MG/0.5ML SOLN injection Inject 0.5 mLs (6 mg total) into the skin every 2 (two) hours as needed for migraine or headache. May repeat in 2 hours if headache persists or recurs. 6 mL 11  . tiZANidine (ZANAFLEX) 4 MG tablet Take 1 tablet (4 mg total) by mouth every 6 (six) hours as needed for muscle spasms. 30 tablet 6  . zonisamide (ZONEGRAN) 100 MG capsule Start taking 1 tablet at  bedtime x 1 week, then take 1 in the morning, 1 in the evening 60 capsule 3   No facility-administered medications prior to visit.    PAST MEDICAL HISTORY: Past Medical History:  Diagnosis Date  . ADHD (attention deficit hyperactivity disorder)   . ADHD, adult residual type   . Anxiety   . Depression   . Family history of adverse reaction to anesthesia    sister has PONV  . GERD (gastroesophageal reflux disease)   . Headache    otc med prn - last one 2 months ago  . 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 Pseudotumor cerebri     PAST SURGICAL HISTORY: Past Surgical History:  Procedure Laterality Date  . ABDOMINAL HYSTERECTOMY    . CESAREAN SECTION     x 2  . CYSTOSCOPY N/A 06/19/2015   Procedure: CYSTOSCOPY;  Surgeon: Jaymes Graff, MD;  Location: [facility];  Service: Gynecology;  Laterality: N/A;  . GASTRECTOMY     [place], [facility]  . IR GENERIC HISTORICAL  05/12/2016   IR FLUORO GUIDED NEEDLE PLC ASPIRATION/INJECTION LOC 05/12/2016 Paulina Fusi, MD [facility]  . LAPAROSCOPIC VAGINAL HYSTERECTOMY WITH SALPINGECTOMY Bilateral 06/19/2015   Procedure: LAPAROSCOPIC ASSISTED VAGINAL HYSTERECTOMY WITH SALPINGECTOMY;  Surgeon: Jaymes Graff, MD;  Location: [facility];  Service: Gynecology;  Laterality: Bilateral;  . lumbar pucture     x2  . SACROILIAC JOINT FUSION Left 10/28/2017   Procedure: SACROILIAC JOINT FUSION;  Surgeon: Venita Lick, MD;  Location: [facility];  Service: Orthopedics;  Laterality: Left;  90 mins  . TONSILLECTOMY      FAMILY HISTORY: Family History  Problem Relation Age of Onset  . Pneumonia Mother        Sepsis  . Heart attack Father     SOCIAL HISTORY: Social History   Socioeconomic History  . Marital status: Married    Spouse name: Not on file  . Number of children: 2  . Years of education: cosmetology  . Highest education level: Not on file  Occupational History  . Occupation: Homemaker  Tobacco Use  . Smoking status: Never Smoker  .  Smokeless tobacco: Never Used  Substance and Sexual Activity  . Alcohol use: Yes    Comment: occasional liquor  . Drug use: No  . Sexual activity: Yes    Birth control/protection: None    Comment: Husband had vasectomy  Other Topics Concern  . Not on file  Social History Narrative   Lives at home with her husband and children.   Right-handed.   3-4 cups caffeine per day.   Social Determinants of Health   Financial Resource Strain:   . Difficulty of Paying Living Expenses:   Food Insecurity:   . Worried About Programme researcher, broadcasting/film/video in the Last  Year:   . Ran Out of Food in the Last Year:   Transportation Needs:   . Film/video editor (Medical):   Marland Kitchen Lack of Transportation (Non-Medical):   Physical Activity:   . Days of Exercise per Week:   . Minutes of Exercise per Session:   Stress:   . Feeling of Stress :   Social Connections:   . Frequency of Communication with Friends and Family:   . Frequency of Social Gatherings with Friends and Family:   . Attends Religious Services:   . Active Member of Clubs or Organizations:   . Attends Archivist Meetings:   Marland Kitchen Marital Status:   Intimate Partner Violence:   . Fear of Current or Ex-Partner:   . Emotionally Abused:   Marland Kitchen Physically Abused:   . Sexually Abused:       PHYSICAL EXAM  There were no vitals filed for this visit. There is no height or weight on file to calculate BMI.  Generalized: Well developed, in no acute distress   Neurological examination  Mentation: Alert oriented to time, place, history taking. Follows all commands speech and language fluent Cranial nerve II-XII: Pupils were equal round reactive to light. Extraocular movements were full, visual field were full on confrontational test. Facial sensation and strength were normal. Uvula tongue midline. Head turning and shoulder shrug  were normal and symmetric. Motor: The motor testing reveals 5 over 5 strength of all 4 extremities. Good symmetric  motor tone is noted throughout.  Sensory: Sensory testing is intact to soft touch on all 4 extremities. No evidence of extinction is noted.  Coordination: Cerebellar testing reveals good finger-nose-finger and heel-to-shin bilaterally.  Gait and station: Gait is normal. Tandem gait is normal. Romberg is negative. No drift is seen.  Reflexes: Deep tendon reflexes are symmetric and normal bilaterally.   DIAGNOSTIC DATA (LABS, IMAGING, TESTING) - I reviewed patient records, labs, notes, testing and imaging myself where available.  Lab Results  Component Value Date   WBC 7.7 02/18/2018   HGB 11.8 (L) 02/18/2018   HCT 37.1 02/18/2018   MCV 99.5 02/18/2018   PLT 310 02/18/2018      Component Value Date/Time   NA 143 02/18/2018 2006   K 4.0 02/18/2018 2006   CL 109 02/18/2018 2006   CO2 25 02/18/2018 2006   GLUCOSE 98 02/18/2018 2006   BUN 13 02/18/2018 2006   CREATININE 0.71 02/18/2018 2006   CALCIUM 8.9 02/18/2018 2006   PROT 7.6 12/11/2010 2035   ALBUMIN 4.0 12/11/2010 2035   AST 21 12/11/2010 2035   ALT 22 12/11/2010 2035   ALKPHOS 86 12/11/2010 2035   BILITOT 0.2 (L) 12/11/2010 2035   GFRNONAA >60 02/18/2018 2006   GFRAA >60 02/18/2018 2006   No results found for: CHOL, HDL, LDLCALC, LDLDIRECT, TRIG, CHOLHDL No results found for: HGBA1C No results found for: VITAMINB12 No results found for: TSH    ASSESSMENT AND PLAN 39 y.o. year old female  has a past medical history of ADHD (attention deficit hyperactivity disorder), ADHD, adult residual type, Anxiety, Depression, Family history of adverse reaction to anesthesia, GERD (gastroesophageal reflux disease), Headache, Post lumbar puncture headache (05/10/2016), and Pseudotumor cerebri. here with ***   I spent 15 minutes with the patient. 50% of this time was spent   Butler Denmark, [place], DNP 05/19/2019, 5:50 AM [facility] [address] [place], [place] [address] [phone]

## 2019-05-22 ENCOUNTER — Other Ambulatory Visit: Payer: Self-pay | Admitting: Neurology

## 2019-05-23 ENCOUNTER — Other Ambulatory Visit: Payer: Self-pay | Admitting: Neurology

## 2019-05-24 ENCOUNTER — Encounter: Payer: Self-pay | Admitting: Neurology

## 2019-06-14 ENCOUNTER — Other Ambulatory Visit: Payer: Self-pay | Admitting: Neurology

## 2019-06-30 IMAGING — RF DG C-ARM 61-120 MIN
1 series · 4 of 4 positions shown · non-contrast
Comparison: None.

CLINICAL DATA: 37-year-old female with a history sacroiliac
dysfunction

EXAM:
BILATERAL SACROILIAC JOINTS - 3+ VIEW; DG C-ARM 61-120 MIN

[Series 1: run · 4 of 4 slices shown]
[im 1/4]
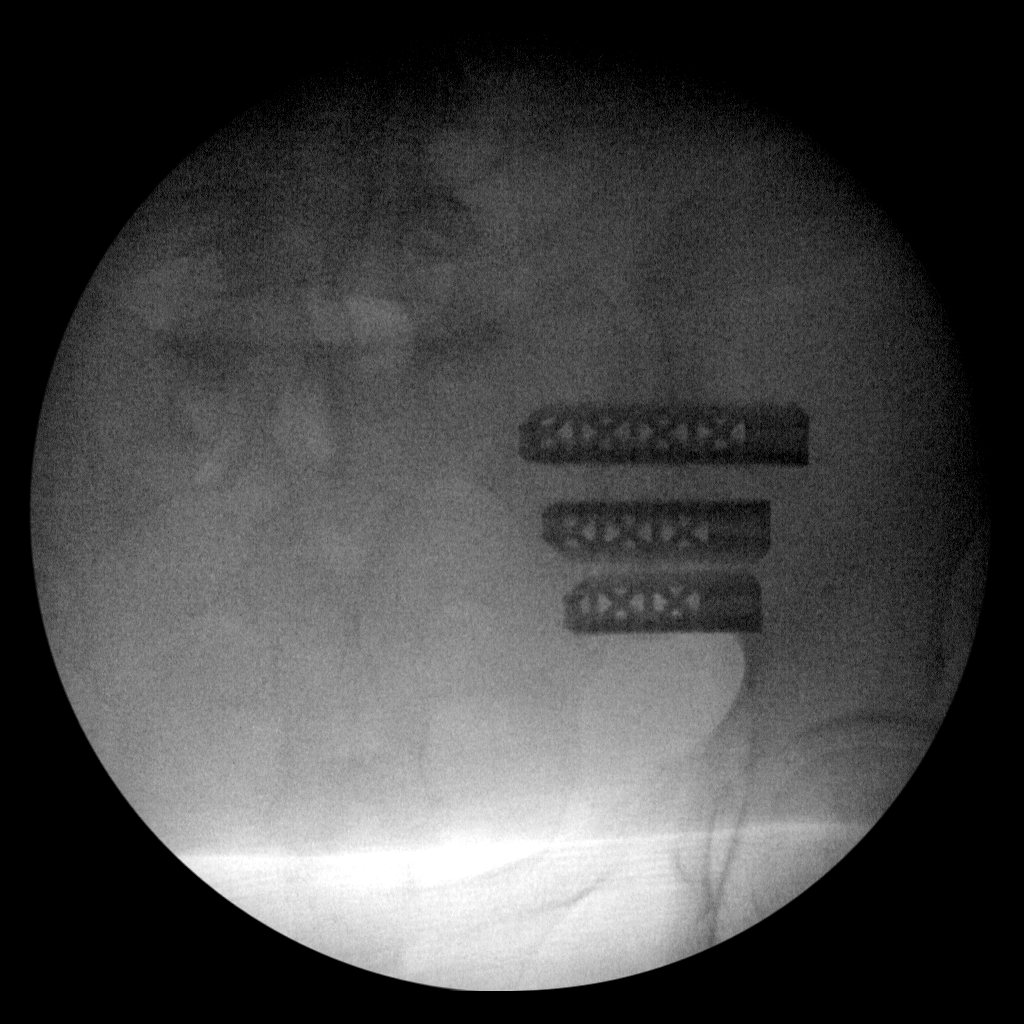
[im 2/4]
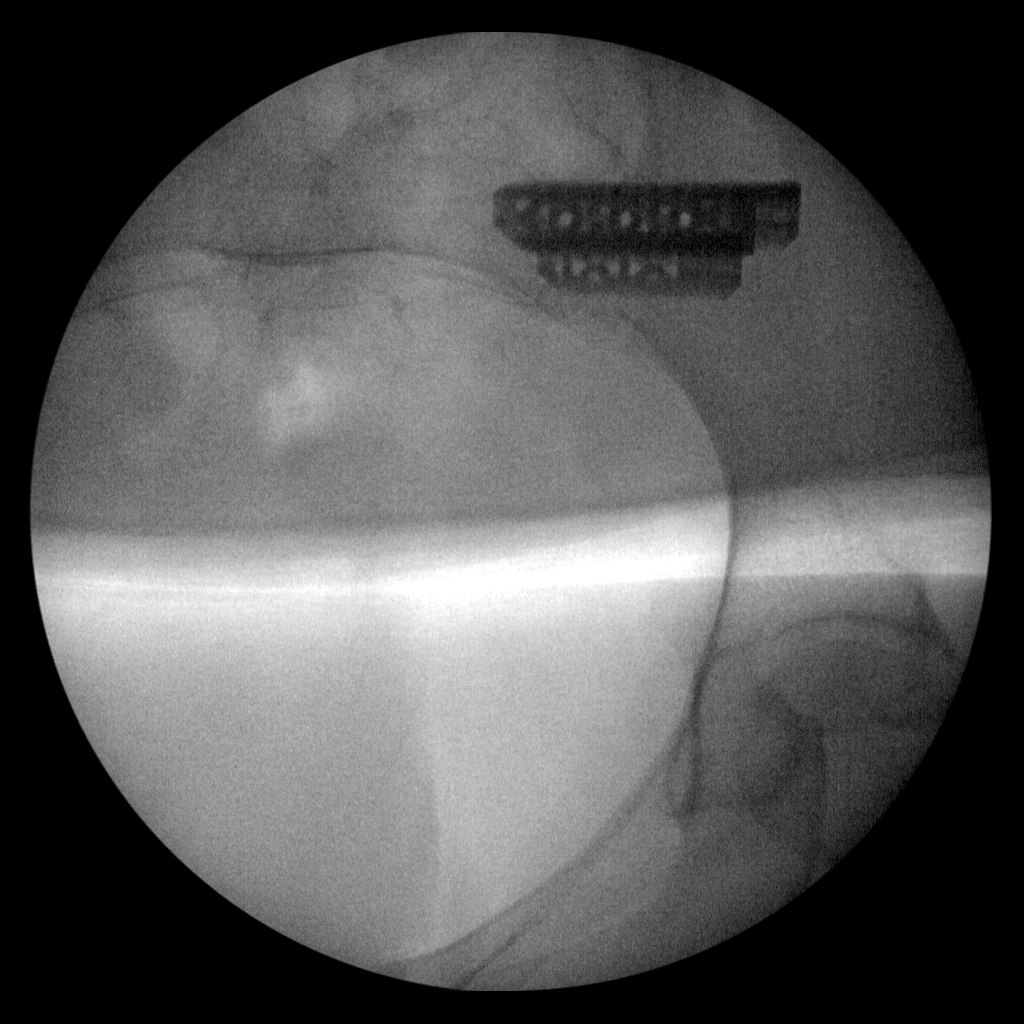
[im 3/4]
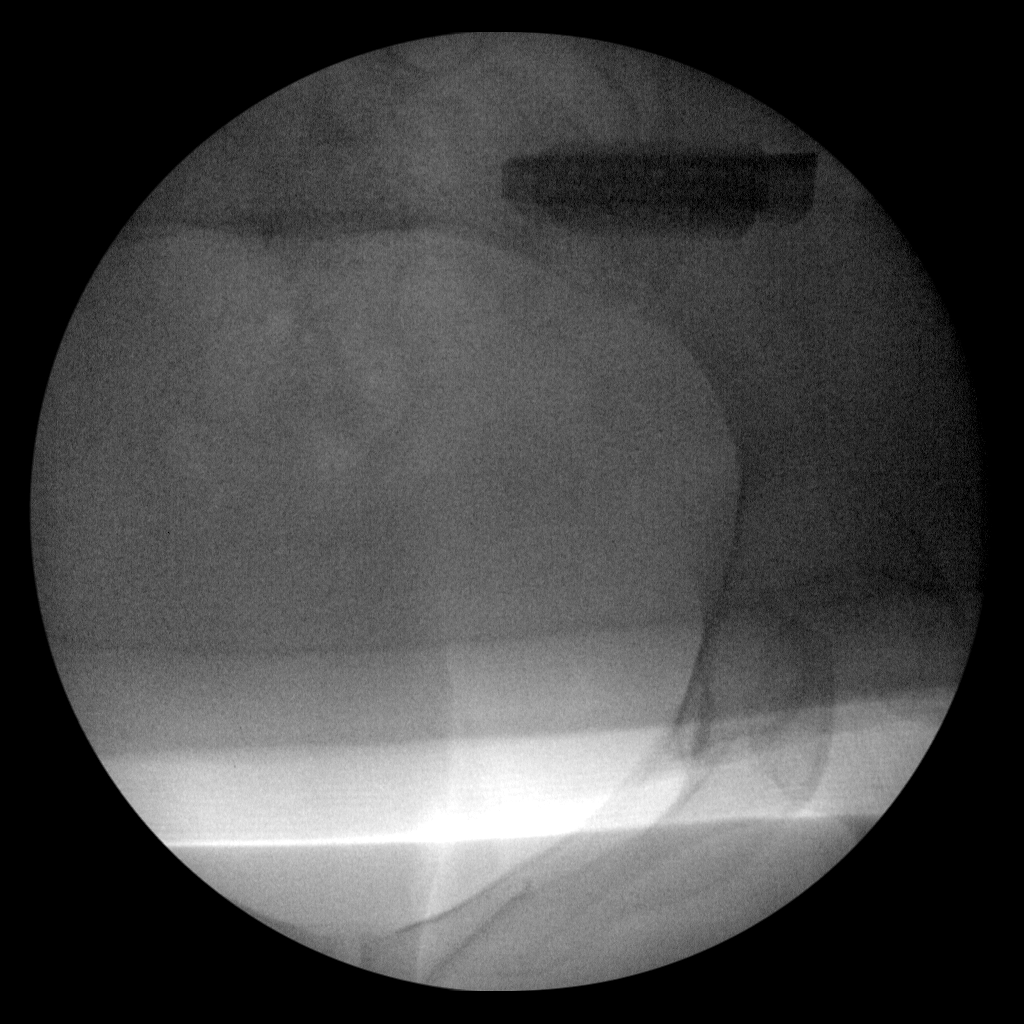
[im 4/4]
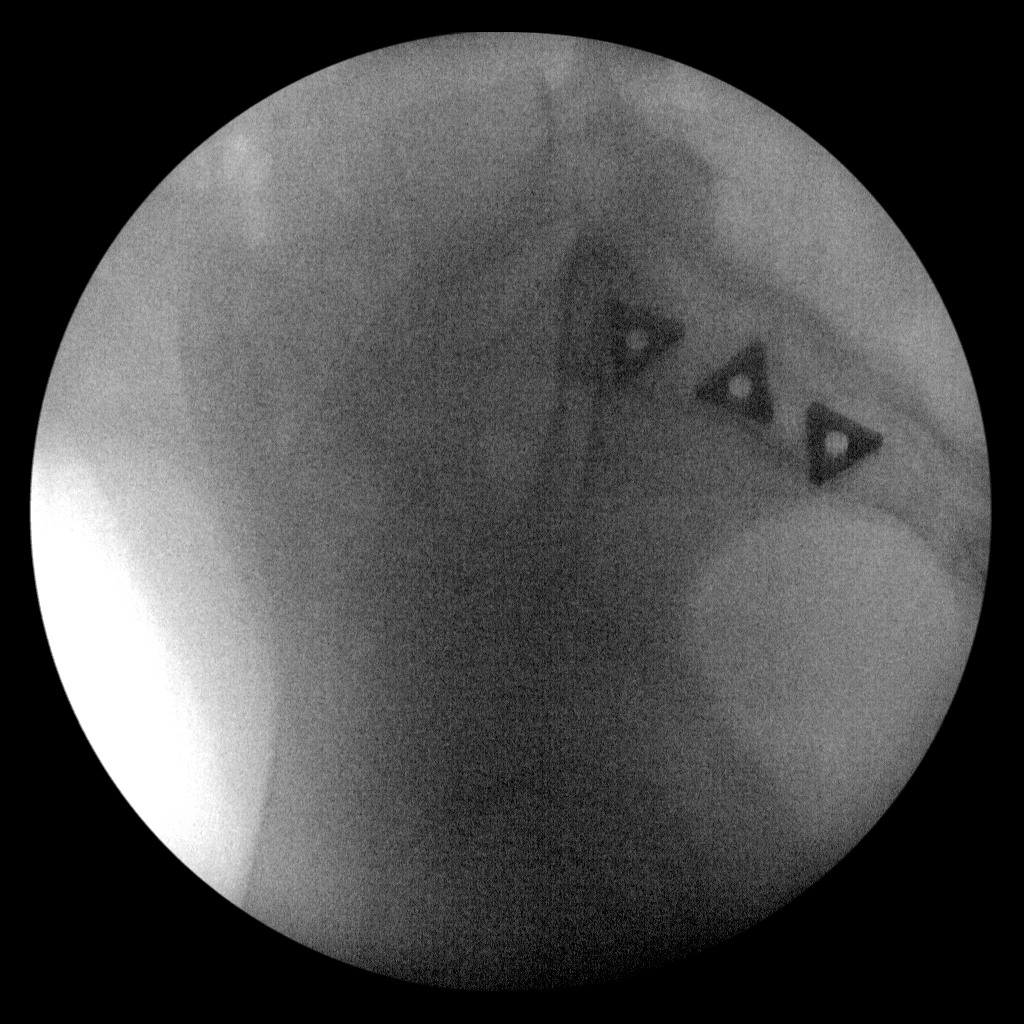

[4 of 4 positions shown; findings below may reference images not displayed]

FINDINGS: Limited intraoperative fluoroscopic spot images of left sacroiliac
arthrodesis.
IMPRESSION: Limited intraoperative fluoroscopic spot images of left-sided
sacroiliac arthrodesis.

## 2019-12-05 ENCOUNTER — Other Ambulatory Visit: Payer: Self-pay | Admitting: Neurology

## 2020-02-19 ENCOUNTER — Other Ambulatory Visit: Payer: Self-pay | Admitting: Neurology

## 2020-10-26 ENCOUNTER — Other Ambulatory Visit: Payer: Self-pay

## 2020-10-26 ENCOUNTER — Emergency Department (HOSPITAL_BASED_OUTPATIENT_CLINIC_OR_DEPARTMENT_OTHER)
Admission: EM | Admit: 2020-10-26 | Discharge: 2020-10-27 | Disposition: A | Discharge status: Home / Self Care | Payer: BC Managed Care – PPO | Attending: Emergency Medicine | Admitting: Emergency Medicine

## 2020-10-26 ENCOUNTER — Encounter (HOSPITAL_BASED_OUTPATIENT_CLINIC_OR_DEPARTMENT_OTHER): Payer: Self-pay

## 2020-10-26 DIAGNOSIS — H53149 Visual discomfort, unspecified: Secondary | ICD-10-CM | POA: Insufficient documentation

## 2020-10-26 DIAGNOSIS — R110 Nausea: Secondary | ICD-10-CM | POA: Insufficient documentation

## 2020-10-26 DIAGNOSIS — R519 Headache, unspecified: Secondary | ICD-10-CM | POA: Insufficient documentation

## 2020-10-26 NOTE — ED Triage Notes (Addendum)
Pt is present for a frontal left sided headache with nausea x four days. Pt is also concerned of her high blood pressure but it is 144/113 during triage. Denies injury to the head or vomiting.

## 2020-10-27 MED ORDER — KETOROLAC TROMETHAMINE 15 MG/ML IJ SOLN
15.0000 mg | Freq: Once | INTRAMUSCULAR | Status: AC
  Administered 2020-10-27: 15 mg via INTRAVENOUS
  Filled 2020-10-27: qty 1

## 2020-10-27 MED ORDER — SODIUM CHLORIDE 0.9 % IV BOLUS
1000.0000 mL | Freq: Once | INTRAVENOUS | Status: AC
  Administered 2020-10-27: 1000 mL via INTRAVENOUS

## 2020-10-27 MED ORDER — DROPERIDOL 2.5 MG/ML IJ SOLN
0.6250 mg | Freq: Once | INTRAMUSCULAR | Status: AC
  Administered 2020-10-27: 0.625 mg via INTRAVENOUS
  Filled 2020-10-27: qty 2

## 2020-10-27 MED ORDER — CYCLOPENTOLATE HCL 1 % OP SOLN
2.0000 [drp] | Freq: Once | OPHTHALMIC | Status: AC
  Administered 2020-10-27: 2 [drp] via OPHTHALMIC
  Filled 2020-10-27: qty 2

## 2020-10-27 MED ORDER — METOCLOPRAMIDE HCL 5 MG/ML IJ SOLN
10.0000 mg | Freq: Once | INTRAMUSCULAR | Status: AC
  Administered 2020-10-27: 10 mg via INTRAVENOUS
  Filled 2020-10-27: qty 2

## 2020-10-27 MED ORDER — DIPHENHYDRAMINE HCL 50 MG/ML IJ SOLN
25.0000 mg | Freq: Once | INTRAMUSCULAR | Status: AC
  Administered 2020-10-27: 25 mg via INTRAVENOUS
  Filled 2020-10-27: qty 1

## 2020-10-27 NOTE — ED Provider Notes (Signed)
[facility] Provider Note: Lowella Dell, MD, FACEP  CSN: [id number] MRN: [id number] ARRIVAL: 10/26/20 at 2206 ROOM: DB014/DB014   CHIEF COMPLAINT  Headache   HISTORY OF PRESENT ILLNESS  10/27/20 12:13 AM [name] is a 40 y.o. female with history of pseudotumor cerebri.  She is status post bariatric surgery with about 100 pound weight loss which is significantly relieved her pseudotumor cerebri.  She no longer wears glasses and is no longer having the headache she previously had.  She does still have migraine headaches.  She is here with a headache that has been present for about 4 days.  It is frontal in location.  It is aching in nature and she rates it as a 10 out of 10.  It has not been relieved with her usual medications.  She has associated nausea without vomiting.  She has associated photophobia.  She denies blurred vision.   Past Medical History:  Diagnosis Date  . ADHD (attention deficit hyperactivity disorder)   . ADHD, adult residual type   . Anxiety   . Depression   . Family history of adverse reaction to anesthesia    sister has PONV  . GERD (gastroesophageal reflux disease)   . Headache    otc med prn - last one 2 months ago  . 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 Pseudotumor cerebri     Past Surgical History:  Procedure Laterality Date  . ABDOMINAL HYSTERECTOMY    . CESAREAN SECTION     x 2  . CYSTOSCOPY N/A 06/19/2015   Procedure: CYSTOSCOPY;  Surgeon: Jaymes Graff, MD;  Location: [facility];  Service: Gynecology;  Laterality: N/A;  . GASTRECTOMY     sleeve, [facility]  . IR GENERIC HISTORICAL  05/12/2016   IR FLUORO GUIDED NEEDLE PLC ASPIRATION/INJECTION LOC 05/12/2016 Paulina Fusi, MD [facility]  . LAPAROSCOPIC VAGINAL HYSTERECTOMY WITH SALPINGECTOMY Bilateral 06/19/2015   Procedure: LAPAROSCOPIC ASSISTED VAGINAL HYSTERECTOMY WITH SALPINGECTOMY;  Surgeon: Jaymes Graff, MD;  Location: [facility];  Service: Gynecology;  Laterality:  Bilateral;  . lumbar pucture     x2  . SACROILIAC JOINT FUSION Left 10/28/2017   Procedure: SACROILIAC JOINT FUSION;  Surgeon: Venita Lick, MD;  Location[facility] OR;  Service: Orthopedics;  Laterality: Left;  90 mins  . TONSILLECTOMY      Family History  Problem Relation Age of Onset  . Pneumonia Mother        Sepsis  . Heart attack Father     Social History   Tobacco Use  . Smoking status: Never  . Smokeless tobacco: Never  Substance Use Topics  . Alcohol use: Yes    Comment: occasional liquor  . Drug use: No    Prior to Admission medications   Medication Sig Start Date End Date Taking? Authorizing Provider  Diclofenac Potassium,Migraine, (CAMBIA) 50 MG PACK Take 50 mg by mouth once as needed for up to 1 dose. 02/17/19   Levert Feinstein, MD  diphenhydrAMINE (BENADRYL) 25 MG tablet Take 50 mg by mouth daily as needed for allergies.    [provider]  eletriptan (RELPAX) 40 MG tablet Take 1 tablet (40 mg total) by mouth as needed for migraine or headache. May repeat in 2 hours if headache persists or recurs. 02/15/19   Levert Feinstein, MD  Erenumab-aooe (AIMOVIG) 70 MG/ML SOAJ Inject 70 mg into the skin every 30 (thirty) days. 02/15/19   Levert Feinstein, MD  escitalopram (LEXAPRO) 20 MG tablet Take 20 mg by  mouth daily.    [provider]  hydrochlorothiazide (MICROZIDE) 12.5 MG capsule Take 12.5 mg by mouth as needed. 09/20/18   [provider]  methocarbamol (ROBAXIN) 750 MG tablet Take 750 mg by mouth 3 (three) times daily as needed for muscle spasms.    [provider]  MOVANTIK 12.5 MG TABS tablet Take 12.5 mg by mouth daily. 10/07/17   [provider]  ondansetron (ZOFRAN ODT) 4 MG disintegrating tablet Take 1 tablet (4 mg total) by mouth every 8 (eight) hours as needed. 02/15/19   Levert Feinstein, MD  oxyCODONE-acetaminophen (PERCOCET) 10-325 MG tablet Take 1 tablet by mouth 3 (three) times daily. 12/16/17   [provider]  promethazine  (PHENERGAN) 25 MG tablet TAKE 1 TABLET EVERY SIX HOURS AS NEEDED FOR NAUSEA OR VOMITING. Call [phone] to schedule follow up. 05/23/19   Levert Feinstein, MD  rizatriptan (MAXALT-MLT) 10 MG disintegrating tablet Take 1 tablet (10 mg total) by mouth as needed for migraine. May repeat in 2 hours if needed 10/26/18   Glean Salvo, NP  SUMAtriptan (IMITREX) 6 MG/0.5ML SOLN injection Inject 0.5 mLs (6 mg total) into the skin every 2 (two) hours as needed for migraine or headache. May repeat in 2 hours if headache persists or recurs. 10/26/18   Glean Salvo, NP  tiZANidine (ZANAFLEX) 4 MG tablet Take 1 tablet (4 mg total) by mouth every 6 (six) hours as needed for muscle spasms. 02/15/19   Levert Feinstein, MD  zonisamide (ZONEGRAN) 100 MG capsule Start taking 1 tablet at bedtime x 1 week, then take 1 in the morning, 1 in the evening 10/26/18   Glean Salvo, NP    Allergies Aspirin, Lorazepam, Naproxen, Penicillins, Duloxetine, Buspar [buspirone], Macrobid [nitrofurantoin monohyd macro], Sudafed [pseudoephedrine hcl], and Topiramate   REVIEW OF SYSTEMS  Negative except as noted here or in the History of Present Illness.   PHYSICAL EXAMINATION  Initial Vital Signs Blood pressure (!) 151/122, pulse 88, temperature 98 F (36.7 C), temperature source Oral, resp. rate 18, height 5\' 4"  (1.626 m), weight 96.2 kg, last menstrual period 06/12/2015, SpO2 100 %.  Examination General: Well-developed, well-nourished female in no acute distress; appearance consistent with age of record HENT: normocephalic; atraumatic Eyes: pupils equal, round and reactive to light; extraocular muscles intact Neck: supple Heart: regular rate and rhythm Lungs: clear to auscultation bilaterally Abdomen: soft; nondistended; nontender; bowel sounds present Extremities: No deformity; full range of motion Neurologic: Awake, alert and oriented; motor function intact in all extremities and symmetric; no facial droop; normal coordination,  speech and gait Skin: Warm and dry Psychiatric: Normal mood and affect   RESULTS  Summary of this visit's results, reviewed and interpreted by myself:   EKG Interpretation  Date/Time:  Friday October 26 2020 23:45:43 EDT Ventricular Rate:  78 PR Interval:  153 QRS Duration: 87 QT Interval:  391 QTC Calculation: 446 R Axis:   44 Text Interpretation: Sinus rhythm Normal ECG No previous ECGs available Confirmed by Amoura Ransier (04-29-2001) on 10/27/2020 12:11:50 AM       Laboratory Studies: No results found for this or any previous visit (from the past 24 hour(s)). Imaging Studies: No results found.  ED COURSE and MDM  Nursing notes, initial and subsequent vitals signs, including pulse oximetry, reviewed and interpreted by myself.  Vitals:   10/26/20 2218 10/26/20 2354 10/27/20 0000  BP: (!) 144/113 (!) 151/122 (!) 156/115  Pulse: 91 88 91  Resp: 17 18 20   Temp:  98 F (36.7 C)    TempSrc: Oral    SpO2: 99% 100% 100%  Weight: 96.2 kg    Height: 5\' 4"  (1.626 m)     Medications  sodium chloride 0.9 % bolus 1,000 mL (0 mLs Intravenous Stopped 10/27/20 0238)  diphenhydrAMINE (BENADRYL) injection 25 mg (25 mg Intravenous Given 10/27/20 0128)  metoCLOPramide (REGLAN) injection 10 mg (10 mg Intravenous Given 10/27/20 0128)  ketorolac (TORADOL) 15 MG/ML injection 15 mg (15 mg Intravenous Given 10/27/20 0128)  cyclopentolate (CYCLODRYL,CYCLOGYL) 1 % ophthalmic solution 2 drop (2 drops Both Eyes Given 10/27/20 0128)  droperidol (INAPSINE) 2.5 MG/ML injection 0.625 mg (0.625 mg Intravenous Given 10/27/20 0238)   2:26 AM Patient improved but still having pain.  We will try a small dose of droperidol.  Her QTC is less than 450.  Pupils were dilated with Cyclogyl.  No papilledema seen with sharply defined disc margins.  2:49 AM Pain significantly improved after IV repair at all.  Patient would like to go home.   PROCEDURES  Procedures   ED DIAGNOSES     ICD-10-CM   1. Bad headache   R51.9          Jumar Greenstreet, MD 10/27/20 [phone]

## 2020-11-13 ENCOUNTER — Other Ambulatory Visit: Payer: Self-pay

## 2020-11-13 ENCOUNTER — Telehealth: Payer: Self-pay | Admitting: Neurology

## 2020-11-13 ENCOUNTER — Emergency Department (HOSPITAL_BASED_OUTPATIENT_CLINIC_OR_DEPARTMENT_OTHER)
Admission: EM | Admit: 2020-11-13 | Discharge: 2020-11-13 | Disposition: A | Discharge status: Home / Self Care | Payer: BC Managed Care – PPO | Attending: Emergency Medicine | Admitting: Emergency Medicine

## 2020-11-13 ENCOUNTER — Encounter (HOSPITAL_BASED_OUTPATIENT_CLINIC_OR_DEPARTMENT_OTHER): Payer: Self-pay | Admitting: Emergency Medicine

## 2020-11-13 DIAGNOSIS — G43519 Persistent migraine aura without cerebral infarction, intractable, without status migrainosus: Secondary | ICD-10-CM | POA: Diagnosis not present

## 2020-11-13 DIAGNOSIS — G43509 Persistent migraine aura without cerebral infarction, not intractable, without status migrainosus: Secondary | ICD-10-CM

## 2020-11-13 DIAGNOSIS — R519 Headache, unspecified: Secondary | ICD-10-CM | POA: Diagnosis present

## 2020-11-13 MED ORDER — DROPERIDOL 2.5 MG/ML IJ SOLN
0.6250 mg | Freq: Once | INTRAMUSCULAR | Status: AC
  Administered 2020-11-13: 0.625 mg via INTRAVENOUS
  Filled 2020-11-13: qty 2

## 2020-11-13 MED ORDER — METOCLOPRAMIDE HCL 5 MG/ML IJ SOLN
10.0000 mg | Freq: Once | INTRAMUSCULAR | Status: AC
  Administered 2020-11-13: 10 mg via INTRAVENOUS
  Filled 2020-11-13: qty 2

## 2020-11-13 MED ORDER — DIPHENHYDRAMINE HCL 50 MG/ML IJ SOLN
50.0000 mg | Freq: Once | INTRAMUSCULAR | Status: AC
  Administered 2020-11-13: 50 mg via INTRAVENOUS
  Filled 2020-11-13: qty 1

## 2020-11-13 MED ORDER — KETOROLAC TROMETHAMINE 30 MG/ML IJ SOLN
30.0000 mg | Freq: Once | INTRAMUSCULAR | Status: AC
  Administered 2020-11-13: 30 mg via INTRAVENOUS
  Filled 2020-11-13: qty 1

## 2020-11-13 MED ORDER — DEXAMETHASONE SODIUM PHOSPHATE 10 MG/ML IJ SOLN
10.0000 mg | Freq: Once | INTRAMUSCULAR | Status: AC
  Administered 2020-11-13: 10 mg via INTRAVENOUS
  Filled 2020-11-13: qty 1

## 2020-11-13 NOTE — ED Triage Notes (Signed)
Pt arrives to ED with c/o of frontal left sided headache for x5 days. The headache is constant and feels like pressure.

## 2020-11-13 NOTE — Discharge Instructions (Addendum)
You have been seen in the ED for a migraine. You were given a migraine cocktail. You were also given a dose of steroids as well.   Please return to the ED if your headache returns or worsens. Please follow up with your PCP on Monday for chronic management options.

## 2020-11-13 NOTE — ED Provider Notes (Signed)
[facility] EMERGENCY DEPT Provider Note   CSN: [id number] Arrival date & time: 11/13/20  1154     History Chief Complaint  Patient presents with   Headache    [name] is a 40 y.o. female.  Medical history of migraine and pseudotumor cerebri.  Patient presents after 4 to 5 days of a headache in her frontal left lobe.  Headache started 4 to 5 days ago and has gradually worsened since then. she says it feels throbbing like pressure.  She says that this headache feels no different than her previous migraines that she has had in the past.  She does have associated nausea with with no vomiting.  She denies any vision changes, weakness, sensation deficits.  She used to be on Aimovig for headaches and had migraine cocktail medications for her to use at home, however she missed her appointment last March and was unable to get any refills.  She is scheduled to see a neurologist in December, as this is the earliest they had available.   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   Pseudotumor cerebri     Patient Active Problem List   Diagnosis Date Noted   S/P lumbar fusion 10/28/2017   Headache 03/10/2017   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   Intractable chronic migraine without aura and with status migrainosus 05/09/2016   Pseudotumor cerebri 05/06/2016   Obesity 05/06/2016   Chronic migraine 05/06/2016   Pelvic pain in female 06/19/2015    Past Surgical  History:  Procedure Laterality Date   ABDOMINAL HYSTERECTOMY     CESAREAN SECTION     x 2   CYSTOSCOPY N/A 06/19/2015   Procedure: CYSTOSCOPY;  Surgeon: Jaymes Graff, MD;  Location: [facility];  Service: Gynecology;  Laterality: N/A;   GASTRECTOMY     sleeve, [facility]   IR GENERIC HISTORICAL  05/12/2016   IR FLUORO GUIDED NEEDLE PLC ASPIRATION/INJECTION LOC 05/12/2016 Paulina Fusi, MD [facility]   LAPAROSCOPIC VAGINAL HYSTERECTOMY WITH SALPINGECTOMY Bilateral 06/19/2015   Procedure: LAPAROSCOPIC ASSISTED VAGINAL HYSTERECTOMY WITH SALPINGECTOMY;  Surgeon: Jaymes Graff, MD;  Location: [facility];  Service: Gynecology;  Laterality: Bilateral;   lumbar pucture     x2   SACROILIAC JOINT FUSION Left 10/28/2017   Procedure: SACROILIAC JOINT FUSION;  Surgeon: Venita Lick, MD;  Location: [facility];  Service: Orthopedics;  Laterality: Left;  90 mins   TONSILLECTOMY       OB History     Gravida  2   Para  2   Term  2   Preterm      AB      Living  2      SAB      IAB      Ectopic      Multiple      Live Births  Family History  Problem Relation Age of Onset   Pneumonia Mother        Sepsis   Heart attack Father     Social History   Tobacco Use   Smoking status: Never   Smokeless tobacco: Never  Substance Use Topics   Alcohol use: Yes    Comment: occasional liquor   Drug use: No    Home Medications Prior to Admission medications   Medication Sig Start Date End Date Taking? Authorizing Provider  oxyCODONE-acetaminophen (PERCOCET) 10-325 MG tablet Take 1 tablet by mouth 3 (three) times daily. 12/16/17  Yes [provider]  tiZANidine (ZANAFLEX) 4 MG tablet Take 1 tablet (4 mg total) by mouth every 6 (six) hours as needed for muscle spasms. 02/15/19  Yes Levert Feinstein, MD  Diclofenac Potassium,Migraine, (CAMBIA) 50 MG PACK Take 50 mg by mouth once as needed for up to 1 dose. 02/17/19   Levert Feinstein, MD  diphenhydrAMINE (BENADRYL) 25 MG tablet  Take 50 mg by mouth daily as needed for allergies.    [provider]  eletriptan (RELPAX) 40 MG tablet Take 1 tablet (40 mg total) by mouth as needed for migraine or headache. May repeat in 2 hours if headache persists or recurs. 02/15/19   Levert Feinstein, MD  hydrochlorothiazide (MICROZIDE) 12.5 MG capsule Take 12.5 mg by mouth as needed. 09/20/18   [provider]  ondansetron (ZOFRAN ODT) 4 MG disintegrating tablet Take 1 tablet (4 mg total) by mouth every 8 (eight) hours as needed. Patient not taking: No sig reported 02/15/19   Levert Feinstein, MD  promethazine (PHENERGAN) 25 MG tablet TAKE 1 TABLET EVERY SIX HOURS AS NEEDED FOR NAUSEA OR VOMITING. Call [phone] to schedule follow up. Patient not taking: No sig reported 05/23/19   Levert Feinstein, MD  rizatriptan (MAXALT-MLT) 10 MG disintegrating tablet Take 1 tablet (10 mg total) by mouth as needed for migraine. May repeat in 2 hours if needed Patient not taking: Reported on 11/13/2020 10/26/18   Glean Salvo, NP  SUMAtriptan (IMITREX) 6 MG/0.5ML SOLN injection Inject 0.5 mLs (6 mg total) into the skin every 2 (two) hours as needed for migraine or headache. May repeat in 2 hours if headache persists or recurs. Patient not taking: Reported on 11/13/2020 10/26/18   Glean Salvo, NP  zonisamide (ZONEGRAN) 100 MG capsule Start taking 1 tablet at bedtime x 1 week, then take 1 in the morning, 1 in the evening 10/26/18   Glean Salvo, NP    Allergies    Aspirin, Lorazepam, Naproxen, Penicillins, Duloxetine, Buspar [buspirone], Macrobid [nitrofurantoin monohyd macro], Sudafed [pseudoephedrine hcl], and Topiramate  Review of Systems   Review of Systems  Constitutional:  Negative for chills and fever.  HENT:  Negative for congestion and rhinorrhea.   Eyes:  Negative for visual disturbance.  Respiratory:  Negative for cough, chest tightness and shortness of breath.   Cardiovascular:  Negative for chest pain, palpitations and leg swelling.   Gastrointestinal:  Positive for nausea. Negative for abdominal pain, constipation, diarrhea and vomiting.  Genitourinary:  Negative for difficulty urinating.  Musculoskeletal:  Negative for back pain.  Skin:  Negative for rash and wound.  Neurological:  Positive for headaches. Negative for dizziness, syncope, weakness and light-headedness.  All other systems reviewed and are negative.  Physical Exam Updated Vital Signs BP (!) 158/115 (BP Location: Right Arm)   Pulse 82   Temp 97.9 F (36.6 C) (Oral)   Resp 16   Ht 5'  4" (1.626 m)   Wt 94.8 kg   LMP 06/12/2015 Comment: negative pregnancy test result today.  SpO2 100%   BMI 35.87 kg/m   Physical Exam Vitals and nursing note reviewed.  Constitutional:      General: She is not in acute distress.    Appearance: Normal appearance. She is not ill-appearing, toxic-appearing or diaphoretic.  HENT:     Head: Normocephalic and atraumatic.  Eyes:     General: Gaze aligned appropriately. No scleral icterus.       Right eye: No discharge.        Left eye: No discharge.     Extraocular Movements: Extraocular movements intact.     Right eye: No nystagmus.     Left eye: No nystagmus.     Conjunctiva/sclera: Conjunctivae normal.     Pupils: Pupils are equal, round, and reactive to light. Pupils are equal.  Pulmonary:     Effort: Pulmonary effort is normal. No respiratory distress.  Neurological:     Mental Status: She is alert and oriented to person, place, and time.     GCS: GCS eye subscore is 4. GCS verbal subscore is 5. GCS motor subscore is 6.     Cranial Nerves: Cranial nerves are intact.     Sensory: Sensation is intact.     Motor: Motor function is intact.     Coordination: Coordination is intact.     Gait: Gait is intact.     Comments: Patient with no motor weakness in any extremity.  Sensation intact distally in all extremities.  Gait is normal.  Patient without deficit.  EOMs are intact.  Pupils are round, equal, and  reactive.  She has no altered mental status.  Cranial nerves without deficit noted.  Psychiatric:        Mood and Affect: Mood normal.        Behavior: Behavior normal.    ED Results / Procedures / Treatments   Labs (all labs ordered are listed, but only abnormal results are displayed) Labs Reviewed - No data to display  EKG EKG Interpretation  Date/Time:  Tuesday November 13 2020 15:04:16 EDT Ventricular Rate:  67 PR Interval:  158 QRS Duration: 89 QT Interval:  430 QTC Calculation: 454 R Axis:   31 Text Interpretation: Sinus rhythm Confirmed by Tanda Rockers ([id number]) on 11/13/2020 3:26:56 PM  Radiology No results found.  Procedures Procedures   Medications Ordered in ED Medications  metoCLOPramide (REGLAN) injection 10 mg (10 mg Intravenous Given 11/13/20 1308)  diphenhydrAMINE (BENADRYL) injection 50 mg (50 mg Intravenous Given 11/13/20 1308)  ketorolac (TORADOL) 30 MG/ML injection 30 mg (30 mg Intravenous Given 11/13/20 1307)  droperidol (INAPSINE) 2.5 MG/ML injection 0.625 mg (0.625 mg Intravenous Given 11/13/20 1521)  dexamethasone (DECADRON) injection 10 mg (10 mg Intravenous Given 11/13/20 1521)    ED Course  I have reviewed the triage vital signs and the nursing notes.  Pertinent labs & imaging results that were available during my care of the patient were reviewed by me and considered in my medical decision making (see chart for details).    MDM Rules/Calculators/A&P                         Patient with history of migraines and pseudotumor cerebri presents to the emergency department with 5 days of headache.  She is well-appearing and appears to be in no acute distress.  Her vitals are overall unremarkable.  She is afebrile.  Past notes where she was seen by the ED provider in the past for similar headaches.  She seemed to respond well with headache cocktail of Toradol, Reglan, Benadryl.  We will try this again to see if it helps.  If it does not help we can try a  steroid injection or droperidol.  Patient needs to follow-up with her neurologist.  She has an appointment scheduled for next December.  I will encourage patient to try to set up an earlier appointment if possible.  1425: On reevaluation, patient states that her pain has improved however she still does have a dull headache.  Her nausea started to improve.  Will obtain EKG to check for QTC prolongation and likely give droperidol.  We will also give 2 mg of IV Decadron for prevention.  1547: Assessment, patient's headache has resolved.  Patient does state that she still is feeling jittery.  This is likely due to the steroids.  I discussed this with the patient.  She feels comfortable going home with return precautions.  She is scheduled a PCP appointment on Monday where she can try to get on some sort of chronic medication for her migraines.  She will get in with neurology as soon as possible.   Final Clinical Impression(s) / ED Diagnoses Final diagnoses:  Persistent migraine aura without cerebral infarction and without status migrainosus, not intractable    Rx / DC Orders ED Discharge Orders     None        Claudie Leach, PA-C 11/13/20 1548    Gwyneth Sprout, MD 11/20/20 1311

## 2020-11-13 NOTE — Telephone Encounter (Signed)
I called the patient again and was able to speak to her. She decided to proceed back to the ED and is receiving another migraine cocktail. Says she would have declined a nerve block anyway. It was painful and not very helpful in the past. She has scheduled a follow up to discuss better management of her migraines long term.

## 2020-11-13 NOTE — Telephone Encounter (Signed)
Pt called, had migraine for 4 days, week ago went to the ER for migraine cocktail. Would like a call  from the nurse to discuss if I can be worked in.

## 2020-11-13 NOTE — Telephone Encounter (Signed)
Left message requesting a return call.  Per vo by Dr. Terrace Arabia, we can offer to work her into the schedule for a nerve block.  Additionally, we need to discuss her compliance with keeping her routine follow up visits.

## 2020-11-13 NOTE — ED Notes (Signed)
Patient verbalizes understanding of discharge instructions. Opportunity for questioning and answers were provided. Patient discharged from ED.  °

## 2020-11-14 DIAGNOSIS — Z0289 Encounter for other administrative examinations: Secondary | ICD-10-CM

## 2021-02-14 ENCOUNTER — Telehealth: Payer: Self-pay | Admitting: *Deleted

## 2021-02-14 ENCOUNTER — Ambulatory Visit (INDEPENDENT_AMBULATORY_CARE_PROVIDER_SITE_OTHER): Payer: BC Managed Care – PPO | Admitting: Neurology

## 2021-02-14 ENCOUNTER — Encounter: Payer: Self-pay | Admitting: Neurology

## 2021-02-14 ENCOUNTER — Other Ambulatory Visit: Payer: Self-pay

## 2021-02-14 ENCOUNTER — Other Ambulatory Visit: Payer: Self-pay | Admitting: *Deleted

## 2021-02-14 VITALS — BP 141/87 | HR 72 | Ht 64.0 in | Wt 210.5 lb

## 2021-02-14 DIAGNOSIS — G932 Benign intracranial hypertension: Secondary | ICD-10-CM

## 2021-02-14 DIAGNOSIS — G43709 Chronic migraine without aura, not intractable, without status migrainosus: Secondary | ICD-10-CM | POA: Insufficient documentation

## 2021-02-14 MED ORDER — ONDANSETRON 4 MG PO TBDP: 4.0000 mg | ORAL_TABLET | Freq: Three times a day (TID) | ORAL | 6 refills | Status: DC | PRN

## 2021-02-14 MED ORDER — PROMETHAZINE HCL 25 MG PO TABS: 25.0000 mg | ORAL_TABLET | Freq: Four times a day (QID) | ORAL | 6 refills | Status: DC | PRN

## 2021-02-14 MED ORDER — AIMOVIG 140 MG/ML ~~LOC~~ SOAJ: 140.0000 mg | SUBCUTANEOUS | 4 refills | Status: DC

## 2021-02-14 MED ORDER — TIZANIDINE HCL 4 MG PO TABS: 4.0000 mg | ORAL_TABLET | Freq: Four times a day (QID) | ORAL | 6 refills | Status: DC | PRN

## 2021-02-14 MED ORDER — RIZATRIPTAN BENZOATE 10 MG PO TABS: ORAL_TABLET | ORAL | 5 refills | Status: DC

## 2021-02-14 MED ORDER — RIZATRIPTAN BENZOATE 10 MG PO TBDP: 10.0000 mg | ORAL_TABLET | ORAL | 11 refills | Status: DC | PRN

## 2021-02-14 NOTE — Telephone Encounter (Signed)
PA for Aimovig 140mg  started on covermymeds (key: [id number]). Pt has pharmacy coverage with [employer] [phone]). Decision pending.

## 2021-02-14 NOTE — Progress Notes (Signed)
PATIENT: [name] DOB: 12-09-1980  REASON FOR VISIT: follow up HISTORY FROM: patient    HISTORY OF PRESENT ILLNESS:[name] is a 40 years old right-handed female, accompanied by her husband [name], seen in refer by her primary care physician nurse practitioner [name] for evaluation of pseudotumor cerebri, initial evaluation was on May 06 2016.   I saw her in 2011, She had a history of hypertension during pregnancy, postpartum depression, frequent headaches postpartum day, there was mild evidence of papillary edema on examination, she was referred for CT head without contrast that was normal in December 2011, lumbar puncture on February 06 2010, patient reported elevated open pressure 44 cm water, she later developed difficulty urination, low back pain, had MRI of lumbar on February 08 2010, that was normal.   I reviewed and summarized the referring note, reported a history of ADHD since age 60, only tolerate Adderall extended release, anxiety, currently taking Wellbutrin 300 mg daily, recently started on BuSpar 15 mg twice a day, Klonopin 1 mg twice a day as needed, also taking instant release Adderall 5 mg one tablet in the afternoon   She  lost insurance has lost follow-up since 2011, today she came in complains of gradual worsening headaches since 2016, now she has daily left occipital headache, spreading forward to become left retro-orbital area severe pounding headache with associated light noise sensitivity, nauseous, she went to emergency room about 6 times over the past 12 months for headache treatment, she also had steady weight gain, complains of transient difficulty focusing with sudden body positional change. She denies tinnitus, no hearing loss.     UPDATE Jul 07 2016:YY She had a fluoroscopy guided lumbar puncture on May 07 2016, open pressure was 23 cm water, closing pressure was 12 cm water.   Post LP she developed severe positional related headache,  require hospital admission, was treated with Morphine as needed, also received scheduled Percocet, Fioricet. She eventually had a blood patch on March twelfth 2018.   She could not tolerate Topamax, she complains of bilateral feet neuropathic pain, numbness tingling, also complains of alternating of the taste especially with soda, and her mouth feel raw, she could not tolerate it, she was put on Zonegran 100 mg twice a day since and of March 2018, she could not tolerate it better, she noticed improvement of her headaches.   Over the past 2 months, on average he has one or two headache each week, left-sided lateralized severe pounding headache with associated light noise sensitivity, nauseous, she is now taking Maxalt 10 mg, sometimes Fioricet as needed, this will usually take care of the headache within one hour.   she is planning on to have bariatric surgery soon. have bariatric evaluation Jul 28 2016.   UPDATE October 29 2016:   Lumbar puncture in March 2018 showed open pressure of 23 cm water, Laboratory evaluation showed normal BMP, INR, CBC, negative HIV,   She tolerated Zonegran well, if she is compliance with it, her migraine would be under better control.  She is planning on to have bariatric surgery. Imitrex injection as needed works well for her, she used average 6 injection every month.    She went to nutritional consultation, has lost 8 pounds over past 2 weeks,  UPDATE Feb 15 2019:  She stopped the Zonegran shortly after her weight loss surgery in September 2018, she lost more than 50 pounds,  Only recently she began to have increased migraine headaches again,  couple times each week, starting at the neck, spreading forward become right retro-orbital area headache with associated light noise sensitivity, nauseous, lasting for few hours, Maxalt dissolvable provide limited help, she rely on Imitrex injection, which helps but not 100%, she also noticed significant burning pain with each  injection, is very hesitant to inject  She denies visual change, weight has been stable, was followed by optometrist every year, there was no new concerns raised  With previous frequent headaches, pseudotumor cerebri, she was treated with Topamax, complains of unbearable side effect, numbness tingling, change of the taste, Diamox, also complains of discomfort in her tongue, feel acid all the time  She is also dealing with depression, fibromyalgia, with recent change to Lexapro  She was started on Zonegran since August 2020, with some improvement of her headaches, but she still have couple headaches each week,  UPDATE Feb 14 2021: She lost to follow-up since 2020, has all of her teeth pulled out in 2021, now has full denture, implant, her headache overall has improved, but still has mild major migraine once a week, lateralized severe headache, light and noise sensitivity, nauseous, worsened by movement.  She went to [facility] urgent care regularly to treat her headache, rely on frequent over-the-counter medication, she has run out of her abortive, and preventive medication, previously was on aimovig 70 mg works well, also on Zomig,   REVIEW OF SYSTEMS: Out of a complete 14 system review of symptoms, the patient complains only of the following symptoms, and all other reviewed systems are negative. As above ALLERGIES: Allergies  Allergen Reactions   Aspirin Other (See Comments)    Stomach pain Severe abdominal pain   Duloxetine Hcl Other (See Comments)    Other reaction(s): Edema   Lorazepam Other (See Comments)    "Can't remember anything while on it" aggressive    Naproxen Nausea And Vomiting and Other (See Comments)    cramping   Penicillins Hives    Has patient had a PCN reaction causing immediate rash, facial/tongue/throat swelling, SOB or lightheadedness with hypotension: No Has patient had a PCN reaction causing severe rash involving mucus membranes or skin necrosis: No Has patient  had a PCN reaction that required hospitalization No Has patient had a PCN reaction occurring within the last 10 years: No If all of the above answers are "NO", then may proceed with Cephalosporin use.   Cephalexin    Duloxetine Other (See Comments)    blisters   Buspar [Buspirone] Other (See Comments)    "felt weird, heart raced, cloudy"   Macrobid [Nitrofurantoin Monohyd Macro] Other (See Comments)    Stomach pain   Sudafed [Pseudoephedrine Hcl] Hives   Topiramate Other (See Comments)    Numbness/tingling in mouth/feet    HOME MEDICATIONS: Outpatient Medications Prior to Visit  Medication Sig Dispense Refill   ALPRAZolam (XANAX XR) 0.5 MG 24 hr tablet Take 1 tablet by mouth daily.     ALPRAZolam (XANAX) 0.5 MG tablet Take 1 tablet by mouth as needed.     Diclofenac Potassium,Migraine, (CAMBIA) 50 MG PACK Take 50 mg by mouth once as needed for up to 1 dose. 9 each 11   diphenhydrAMINE (BENADRYL) 25 MG tablet Take 50 mg by mouth daily as needed for allergies.     eletriptan (RELPAX) 40 MG tablet Take 1 tablet (40 mg total) by mouth as needed for migraine or headache. May repeat in 2 hours if headache persists or recurs. 12 tablet 11   hydrochlorothiazide (MICROZIDE) 12.5  MG capsule Take 12.5 mg by mouth as needed.     ondansetron (ZOFRAN ODT) 4 MG disintegrating tablet Take 1 tablet (4 mg total) by mouth every 8 (eight) hours as needed. 20 tablet 6   orphenadrine (NORFLEX) 100 MG tablet Take 1 tablet by mouth 2 (two) times daily.     oxyCODONE-acetaminophen (PERCOCET) 10-325 MG tablet Take 1 tablet by mouth 3 (three) times daily.     promethazine (PHENERGAN) 25 MG tablet TAKE 1 TABLET EVERY SIX HOURS AS NEEDED FOR NAUSEA OR VOMITING. Call [phone] to schedule follow up. 30 tablet 0   rizatriptan (MAXALT-MLT) 10 MG disintegrating tablet Take 1 tablet (10 mg total) by mouth as needed for migraine. May repeat in 2 hours if needed 10 tablet 11   sertraline (ZOLOFT) 100 MG tablet Take 1 tablet  by mouth daily.     sertraline (ZOLOFT) 50 MG tablet Take 1 tablet by mouth daily.     traZODone (DESYREL) 100 MG tablet Take 2 tablets by mouth at bedtime.     Vitamin D, Ergocalciferol, (DRISDOL) 1.25 MG (50000 UNIT) CAPS capsule Take 50,000 Units by mouth 2 (two) times a week.     zonisamide (ZONEGRAN) 100 MG capsule Start taking 1 tablet at bedtime x 1 week, then take 1 in the morning, 1 in the evening 60 capsule 3   SUMAtriptan (IMITREX) 6 MG/0.5ML SOLN injection Inject 0.5 mLs (6 mg total) into the skin every 2 (two) hours as needed for migraine or headache. May repeat in 2 hours if headache persists or recurs. (Patient not taking: Reported on 11/13/2020) 6 mL 11   tiZANidine (ZANAFLEX) 4 MG tablet Take 1 tablet (4 mg total) by mouth every 6 (six) hours as needed for muscle spasms. 30 tablet 6   No facility-administered medications prior to visit.    PAST MEDICAL HISTORY: Past Medical History:  Diagnosis Date   ADHD (attention deficit hyperactivity disorder)    ADHD, adult residual type    Anxiety    Depression    Family history of adverse reaction to anesthesia    sister has PONV   GERD (gastroesophageal reflux disease)    Headache    otc med prn - last one 2 months ago   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   Pseudotumor cerebri     PAST SURGICAL HISTORY: Past Surgical History:  Procedure Laterality Date   ABDOMINAL HYSTERECTOMY     CESAREAN SECTION     x 2   CYSTOSCOPY N/A 06/19/2015   Procedure: CYSTOSCOPY;  Surgeon: Jaymes Graff, MD;  Location: [facility];  Service: Gynecology;  Laterality: N/A;   GASTRECTOMY     sleeve, [facility]   IR GENERIC HISTORICAL  05/12/2016   IR FLUORO GUIDED NEEDLE PLC ASPIRATION/INJECTION LOC 05/12/2016 Paulina Fusi, MD [facility]   LAPAROSCOPIC VAGINAL HYSTERECTOMY WITH SALPINGECTOMY Bilateral 06/19/2015   Procedure: LAPAROSCOPIC ASSISTED VAGINAL HYSTERECTOMY WITH SALPINGECTOMY;  Surgeon: Jaymes Graff, MD;  Location: [facility];   Service: Gynecology;  Laterality: Bilateral;   lumbar pucture     x2   SACROILIAC JOINT FUSION Left 10/28/2017   Procedure: SACROILIAC JOINT FUSION;  Surgeon: Venita Lick, MD;  Location: [facility];  Service: Orthopedics;  Laterality: Left;  90 mins   TONSILLECTOMY      FAMILY HISTORY: Family History  Problem Relation Age of Onset   Pneumonia Mother        Sepsis   Heart attack Father     SOCIAL HISTORY: Social History  Socioeconomic History   Marital status: Married    Spouse name: Not on file   Number of children: 2   Years of education: cosmetology   Highest education level: Not on file  Occupational History   Occupation: Homemaker  Tobacco Use   Smoking status: Never   Smokeless tobacco: Never  Substance and Sexual Activity   Alcohol use: Yes    Comment: occasional liquor   Drug use: No   Sexual activity: Yes    Birth control/protection: None    Comment: Husband had vasectomy  Other Topics Concern   Not on file  Social History Narrative   Lives at home with her husband and children.   Right-handed.   3-4 cups caffeine per day.   Social Determinants of Health   Financial Resource Strain: Not on file  Food Insecurity: Not on file  Transportation Needs: Not on file  Physical Activity: Not on file  Stress: Not on file  Social Connections: Not on file  Intimate Partner Violence: Not on file    PHYSICAL EXAM  Vitals:   02/14/21 1132  BP: (!) 141/87  Pulse: 72  Weight: 210 lb 8 oz (95.5 kg)  Height: 5\' 4"  (1.626 m)   Body mass index is 36.13 kg/m.  PHYSICAL EXAMNIATION:  Gen: NAD, conversant, well nourised, well groomed                     Cardiovascular: Regular rate rhythm, no peripheral edema, warm, nontender. Eyes: Conjunctivae clear without exudates or hemorrhage Neck: Supple, no carotid bruits. Pulmonary: Clear to auscultation bilaterally   NEUROLOGICAL EXAM:  MENTAL STATUS: Speech/Cognition: Awake, alert, normal speech, oriented to  history taking and casual conversation.  CRANIAL NERVES: CN II: Visual fields are full to confrontation.  Pupils are round equal and briskly reactive to light. CN III, IV, VI: extraocular movement are normal. No ptosis. CN V: Facial sensation is intact to light touch. CN VII: Face is symmetric with normal eye closure and smile. CN VIII: Hearing is normal to casual conversation CN IX, X: Palate elevates symmetrically. Phonation is normal. CN XI: Head turning and shoulder shrug are intact CN XII: Tongue is midline with normal movements and no atrophy.  MOTOR: Muscle bulk and tone are normal. Muscle strength is normal.  REFLEXES: Reflexes are 2  and symmetric at the biceps, triceps, knees and ankles. Plantar responses are flexor.  SENSORY: Intact to light touch, pinprick, positional and vibratory sensation at fingers and toes.  COORDINATION: There is no trunk or limb ataxia.    GAIT/STANCE: Posture is normal. Gait is steady with normal steps, base, arm swing and turning.    DIAGNOSTIC DATA (LABS, IMAGING, TESTING) - I reviewed patient records, labs, notes, testing and imaging myself where available.  Lab Results  Component Value Date   WBC 7.7 02/18/2018   HGB 11.8 (L) 02/18/2018   HCT 37.1 02/18/2018   MCV 99.5 02/18/2018   PLT 310 02/18/2018      Component Value Date/Time   NA 143 02/18/2018 2006   K 4.0 02/18/2018 2006   CL 109 02/18/2018 2006   CO2 25 02/18/2018 2006   GLUCOSE 98 02/18/2018 2006   BUN 13 02/18/2018 2006   CREATININE 0.71 02/18/2018 2006   CALCIUM 8.9 02/18/2018 2006   PROT 7.6 12/11/2010 2035   ALBUMIN 4.0 12/11/2010 2035   AST 21 12/11/2010 2035   ALT 22 12/11/2010 2035   ALKPHOS 86 12/11/2010 2035   BILITOT 0.2 (L)  12/11/2010 2035   GFRNONAA >60 02/18/2018 2006   GFRAA >60 02/18/2018 2006    CT scan of the brain 10/21/2018 IMPRESSION:  No acute intracranial abnormality identified.  ASSESSMENT AND PLAN 40 y.o. year old female  1.   History of pseudotumor cerebri 2.  Chronic migraine  Has been off her preventive medication since 2020, responded to aimovig well  Restart aimovig 140 mg every month as preventive medication  Maxalt as needed, may combine with Phenergan, tizanidine for prolonged severe headaches.  Levert Feinstein, M.D. Ph.D.  [facility] [address] [place], [place] [address] Phone: [phone] Fax:      [phone]

## 2021-02-14 NOTE — Patient Instructions (Signed)
Meds ordered this encounter  Medications   Erenumab-aooe (AIMOVIG) 140 MG/ML SOAJ    Sig: Inject 140 mg into the skin every 30 (thirty) days.    Dispense:  3 mL    Refill:  4   rizatriptan (MAXALT-MLT) 10 MG disintegrating tablet    Sig: Take 1 tablet (10 mg total) by mouth as needed for migraine. May repeat in 2 hours if needed    Dispense:  10 tablet    Refill:  11   tiZANidine (ZANAFLEX) 4 MG tablet    Sig: Take 1 tablet (4 mg total) by mouth every 6 (six) hours as needed for muscle spasms.    Dispense:  30 tablet    Refill:  6   promethazine (PHENERGAN) 25 MG tablet    Sig: Take 1 tablet (25 mg total) by mouth every 6 (six) hours as needed for nausea or vomiting.    Dispense:  30 tablet    Refill:  6     Take aimovig 140 subcutaneous injection once every month as preventive medication  When you do have migraine, may take Maxalt 10 mg as needed, can mix together with Phenergan 25 mg, tizanidine, Tylenol as needed and go to sleep, for prolonged severe headaches

## 2021-02-18 NOTE — Telephone Encounter (Signed)
PA approved through 02/13/2022.

## 2021-04-18 DIAGNOSIS — F419 Anxiety disorder, unspecified: Secondary | ICD-10-CM | POA: Insufficient documentation

## 2021-05-28 ENCOUNTER — Other Ambulatory Visit: Payer: Self-pay | Admitting: Neurology

## 2021-05-29 ENCOUNTER — Other Ambulatory Visit: Payer: Self-pay

## 2021-05-29 MED ORDER — TIZANIDINE HCL 4 MG PO TABS: 4.0000 mg | ORAL_TABLET | Freq: Four times a day (QID) | ORAL | 6 refills | Status: DC | PRN

## 2021-08-21 NOTE — Progress Notes (Signed)
Patient: [name] Date of Birth: 10-Jun-1980  Reason for Visit: Follow up History from: Patient Primary Neurologist: Terrace Arabia  ASSESSMENT AND PLAN 41 y.o. year old female   1.  History of pseudotumor cerebri 2.  Chronic migraine headaches -Restart Zonegran, working up to 100 mg at bedtime, if needed, will go back up to 100 mg twice a day -Try Relpax as needed for acute headache, may combine with Aleve, tizanidine, Phenergan for severe headache -In the past has not been able to tolerate Topamax, Diamox, Aimovig was too expensive, lack of benefit with Maxalt, Imitrex -Currently taking propanolol, doxepin, clonidine -Recent weight gain about 30 pounds in the last 6 weeks with the addition of doxepin, seeing PCP next week to discuss medication adjustment -Recommend seeing ophthalmology to ensure no return of papilledema, given significant weight gain over short  period of time, however she does not feel headaches are similar to prior IIH -Encouraged to reach out for any issues, otherwise follow-up in 4 to 6 months   HISTORY OF PRESENT ILLNESS:[name] is a 41 years old right-handed female, accompanied by her husband [name], seen in refer by her primary care physician nurse practitioner [name] for evaluation of pseudotumor cerebri, initial evaluation was on May 06 2016.   I saw her in 2011, She had a history of hypertension during pregnancy, postpartum depression, frequent headaches postpartum day, there was mild evidence of papillary edema on examination, she was referred for CT head without contrast that was normal in December 2011, lumbar puncture on February 06 2010, patient reported elevated open pressure 44 cm water, she later developed difficulty urination, low back pain, had MRI of lumbar on February 08 2010, that was normal.   I reviewed and summarized the referring note, reported a history of ADHD since age 41, only tolerate Adderall extended release, anxiety,  currently taking Wellbutrin 300 mg daily, recently started on BuSpar 15 mg twice a day, Klonopin 1 mg twice a day as needed, also taking instant release Adderall 5 mg one tablet in the afternoon   She  lost insurance has lost follow-up since 2011, today she came in complains of gradual worsening headaches since 2016, now she has daily left occipital headache, spreading forward to become left retro-orbital area severe pounding headache with associated light noise sensitivity, nauseous, she went to emergency room about 6 times over the past 12 months for headache treatment, she also had steady weight gain, complains of transient difficulty focusing with sudden body positional change. She denies tinnitus, no hearing loss.   UPDATE Jul 07 2016:YY She had a fluoroscopy guided lumbar puncture on May 07 2016, open pressure was 23 cm water, closing pressure was 12 cm water.   Post LP she developed severe positional related headache, require hospital admission, was treated with Morphine as needed, also received scheduled Percocet, Fioricet. She eventually had a blood patch on March twelfth 2018.   She could not tolerate Topamax, she complains of bilateral feet neuropathic pain, numbness tingling, also complains of alternating of the taste especially with soda, and her mouth feel raw, she could not tolerate it, she was put on Zonegran 100 mg twice a day since and of March 2018, she could not tolerate it better, she noticed improvement of her headaches.   Over the past 2 months, on average he has one or two headache each week, left-sided lateralized severe pounding headache with associated light noise sensitivity, nauseous, she is now taking Maxalt 10 mg, sometimes  Fioricet as needed, this will usually take care of the headache within one hour.   she is planning on to have bariatric surgery soon. have bariatric evaluation Jul 28 2016.   UPDATE October 29 2016:   Lumbar puncture in March 2018 showed open pressure  of 23 cm water, Laboratory evaluation showed normal BMP, INR, CBC, negative HIV,   She tolerated Zonegran well, if she is compliance with it, her migraine would be under better control.  She is planning on to have bariatric surgery. Imitrex injection as needed works well for her, she used average 6 injection every month.    She went to nutritional consultation, has lost 8 pounds over past 2 weeks,   UPDATE Feb 15 2019:  She stopped the Zonegran shortly after her weight loss surgery in September 2018, she lost more than 50 pounds,   Only recently she began to have increased migraine headaches again, couple times each week, starting at the neck, spreading forward become right retro-orbital area headache with associated light noise sensitivity, nauseous, lasting for few hours, Maxalt dissolvable provide limited help, she rely on Imitrex injection, which helps but not 100%, she also noticed significant burning pain with each injection, is very hesitant to inject   She denies visual change, weight has been stable, was followed by optometrist every year, there was no new concerns raised   With previous frequent headaches, pseudotumor cerebri, she was treated with Topamax, complains of unbearable side effect, numbness tingling, change of the taste, Diamox, also complains of discomfort in her tongue, feel acid all the time   She is also dealing with depression, fibromyalgia, with recent change to Lexapro   She was started on Zonegran since August 2020, with some improvement of her headaches, but she still have couple headaches each week,   UPDATE Feb 14 2021: She lost to follow-up since 2020, has all of her teeth pulled out in 2021, now has full denture, implant, her headache overall has improved, but still has mild major migraine once a week, lateralized severe headache, light and noise sensitivity, nauseous, worsened by movement.   She went to [facility] urgent care regularly to treat her headache, rely  on frequent over-the-counter medication, she has run out of her abortive, and preventive medication, previously was on aimovig 70 mg works well, also on Zomig,   Update August 22, 2021 SS: Never started back on the Aimovig, claims $700 a month even with insurance. Has a poor plan. Having 2-3 migraines a week, hard to get completely rid of. Takes Maxalt, ibuprofen/Tylenol. Saw Dr. [place] within the last year, no reports of papilledema. Started Doxepin, last month, has gained 30 lbs. Migraines are frontal, doesn't feel like IIH.  REVIEW OF SYSTEMS: Out of a complete 14 system review of symptoms, the patient complains only of the following symptoms, and all other reviewed systems are negative.  See HPI  ALLERGIES: Allergies  Allergen Reactions   Aspirin Other (See Comments)    Stomach pain Severe abdominal pain   Duloxetine Hcl Other (See Comments)    Other reaction(s): Edema   Lorazepam Other (See Comments)    "Can't remember anything while on it" aggressive    Naproxen Nausea And Vomiting and Other (See Comments)    cramping   Penicillins Hives    Has patient had a PCN reaction causing immediate rash, facial/tongue/throat swelling, SOB or lightheadedness with hypotension: No Has patient had a PCN reaction causing severe rash involving mucus membranes or skin necrosis: No Has  patient had a PCN reaction that required hospitalization No Has patient had a PCN reaction occurring within the last 10 years: No If all of the above answers are "NO", then may proceed with Cephalosporin use.   Cephalexin    Duloxetine Other (See Comments)    blisters   Buspar [Buspirone] Other (See Comments)    "felt weird, heart raced, cloudy"   Macrobid [Nitrofurantoin Monohyd Macro] Other (See Comments)    Stomach pain   Sudafed [Pseudoephedrine Hcl] Hives   Topiramate Other (See Comments)    Numbness/tingling in mouth/feet    HOME MEDICATIONS: Outpatient Medications Prior to Visit  Medication Sig Dispense  Refill   ALPRAZolam (XANAX XR) 0.5 MG 24 hr tablet Take 1 tablet by mouth daily.     CLONIDINE HCL PO Take 1 mg by mouth 3 (three) times daily.     diphenhydrAMINE (BENADRYL) 25 MG tablet Take 50 mg by mouth daily as needed for allergies.     doxepin (SINEQUAN) 100 MG capsule Take 100 mg by mouth at bedtime.     furosemide (LASIX) 20 MG tablet Take 10-20 mg by mouth daily as needed.     hydrochlorothiazide (MICROZIDE) 12.5 MG capsule Take 12.5 mg by mouth as needed.     ondansetron (ZOFRAN ODT) 4 MG disintegrating tablet Take 1 tablet (4 mg total) by mouth every 8 (eight) hours as needed. 20 tablet 6   propranolol ER (INDERAL LA) 60 MG 24 hr capsule Take 1 capsule by mouth daily.     sertraline (ZOLOFT) 100 MG tablet Take 1 tablet by mouth daily.     tiZANidine (ZANAFLEX) 4 MG tablet Take 1 tablet (4 mg total) by mouth every 6 (six) hours as needed for muscle spasms. 30 tablet 6   Vitamin D, Ergocalciferol, (DRISDOL) 1.25 MG (50000 UNIT) CAPS capsule Take 50,000 Units by mouth 2 (two) times a week.     eletriptan (RELPAX) 40 MG tablet Take 1 tablet (40 mg total) by mouth as needed for migraine or headache. May repeat in 2 hours if headache persists or recurs. 12 tablet 11   promethazine (PHENERGAN) 25 MG tablet Take 1 tablet (25 mg total) by mouth every 6 (six) hours as needed for nausea or vomiting. 30 tablet 6   rizatriptan (MAXALT) 10 MG tablet Take 1 tab at onset of migraine.  May repeat in 2 hrs, if needed.  Max dose: 2 tabs/day. This is a 30 day prescription. 10 tablet 5   ALPRAZolam (XANAX) 0.5 MG tablet Take 1 tablet by mouth as needed.     Diclofenac Potassium,Migraine, (CAMBIA) 50 MG PACK Take 50 mg by mouth once as needed for up to 1 dose. 9 each 11   Erenumab-aooe (AIMOVIG) 140 MG/ML SOAJ Inject 140 mg into the skin every 30 (thirty) days. (Patient not taking: Reported on 08/22/2021) 3 mL 4   orphenadrine (NORFLEX) 100 MG tablet Take 1 tablet by mouth 2 (two) times daily.      oxyCODONE-acetaminophen (PERCOCET) 10-325 MG tablet Take 1 tablet by mouth 3 (three) times daily.     sertraline (ZOLOFT) 50 MG tablet Take 1 tablet by mouth daily.     traZODone (DESYREL) 100 MG tablet Take 2 tablets by mouth at bedtime.     No facility-administered medications prior to visit.    PAST MEDICAL HISTORY: Past Medical History:  Diagnosis Date   ADHD (attention deficit hyperactivity disorder)    ADHD, adult residual type    Anxiety    Depression  Family history of adverse reaction to anesthesia    sister has PONV   GERD (gastroesophageal reflux disease)    Headache    otc med prn - last one 2 months ago   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   Pseudotumor cerebri     PAST SURGICAL HISTORY: Past Surgical History:  Procedure Laterality Date   ABDOMINAL HYSTERECTOMY     CESAREAN SECTION     x 2   CYSTOSCOPY N/A 06/19/2015   Procedure: CYSTOSCOPY;  Surgeon: Jaymes Graff, MD;  Location: [facility];  Service: Gynecology;  Laterality: N/A;   GASTRECTOMY     sleeve, [facility]   IR GENERIC HISTORICAL  05/12/2016   IR FLUORO GUIDED NEEDLE PLC ASPIRATION/INJECTION LOC 05/12/2016 Paulina Fusi, MD [facility]   LAPAROSCOPIC VAGINAL HYSTERECTOMY WITH SALPINGECTOMY Bilateral 06/19/2015   Procedure: LAPAROSCOPIC ASSISTED VAGINAL HYSTERECTOMY WITH SALPINGECTOMY;  Surgeon: Jaymes Graff, MD;  Location: [facility];  Service: Gynecology;  Laterality: Bilateral;   lumbar pucture     x2   SACROILIAC JOINT FUSION Left 10/28/2017   Procedure: SACROILIAC JOINT FUSION;  Surgeon: Venita Lick, MD;  Location: [facility] OR;  Service: Orthopedics;  Laterality: Left;  90 mins   TONSILLECTOMY      FAMILY HISTORY: Family History  Problem Relation Age of Onset   Pneumonia Mother        Sepsis   Heart attack Father     SOCIAL HISTORY: Social History   Socioeconomic History   Marital status: Married    Spouse name: Not on file   Number of children: 2   Years of education: cosmetology    Highest education level: Not on file  Occupational History   Occupation: Homemaker  Tobacco Use   Smoking status: Never   Smokeless tobacco: Never  Substance and Sexual Activity   Alcohol use: Yes    Comment: occasional liquor   Drug use: No   Sexual activity: Yes    Birth control/protection: None    Comment: Husband had vasectomy  Other Topics Concern   Not on file  Social History Narrative   Lives at home with her husband and children.   Right-handed.   3-4 cups caffeine per day.   Social Determinants of Health   Financial Resource Strain: Not on file  Food Insecurity: Not on file  Transportation Needs: Not on file  Physical Activity: Not on file  Stress: Not on file  Social Connections: Not on file  Intimate Partner Violence: Not on file    PHYSICAL EXAM  Vitals:   08/22/21 1251  BP: 91/71  Pulse: 84  Weight: 224 lb 8 oz (101.8 kg)  Height: 5\' 4"  (1.626 m)   Body mass index is 38.54 kg/m.  Generalized: Well developed, in no acute distress  Neurological examination  Mentation: Alert oriented to time, place, history taking. Follows all commands speech and language fluent Cranial nerve II-XII: Pupils were equal round reactive to light. Extraocular movements were full, visual field were full on confrontational test. Facial sensation and strength were normal. Head turning and shoulder shrug  were normal and symmetric. Motor: The motor testing reveals 5 over 5 strength of all 4 extremities. Good symmetric motor tone is noted throughout.  Sensory: Sensory testing is intact to soft touch on all 4 extremities. No evidence of extinction is noted.  Coordination: Cerebellar testing reveals good finger-nose-finger and heel-to-shin bilaterally.  Gait and station: Gait is normal.   Reflexes: Deep tendon reflexes are symmetric and normal bilaterally.  DIAGNOSTIC DATA (LABS, IMAGING, TESTING) - I reviewed patient records, labs, notes, testing and imaging myself where  available.  Lab Results  Component Value Date   WBC 7.7 02/18/2018   HGB 11.8 (L) 02/18/2018   HCT 37.1 02/18/2018   MCV 99.5 02/18/2018   PLT 310 02/18/2018      Component Value Date/Time   NA 143 02/18/2018 2006   K 4.0 02/18/2018 2006   CL 109 02/18/2018 2006   CO2 25 02/18/2018 2006   GLUCOSE 98 02/18/2018 2006   BUN 13 02/18/2018 2006   CREATININE 0.71 02/18/2018 2006   CALCIUM 8.9 02/18/2018 2006   PROT 7.6 12/11/2010 2035   ALBUMIN 4.0 12/11/2010 2035   AST 21 12/11/2010 2035   ALT 22 12/11/2010 2035   ALKPHOS 86 12/11/2010 2035   BILITOT 0.2 (L) 12/11/2010 2035   GFRNONAA >60 02/18/2018 2006   GFRAA >60 02/18/2018 2006   No results found for: "CHOL", "HDL", "LDLCALC", "LDLDIRECT", "TRIG", "CHOLHDL" No results found for: "HGBA1C" No results found for: "VITAMINB12" No results found for: "TSH"  Margie Ege, AGNP-C, DNP 08/22/2021, 1:22 PM [facility] [address] [place], [place] [address] [phone]

## 2021-08-22 ENCOUNTER — Ambulatory Visit: Payer: BC Managed Care – PPO | Admitting: Neurology

## 2021-08-22 ENCOUNTER — Encounter: Payer: Self-pay | Admitting: Neurology

## 2021-08-22 VITALS — BP 91/71 | HR 84 | Ht 64.0 in | Wt 224.5 lb

## 2021-08-22 DIAGNOSIS — G43711 Chronic migraine without aura, intractable, with status migrainosus: Secondary | ICD-10-CM | POA: Diagnosis not present

## 2021-08-22 MED ORDER — ZONISAMIDE 50 MG PO CAPS: ORAL_CAPSULE | ORAL | 6 refills | Status: DC

## 2021-08-22 MED ORDER — PROMETHAZINE HCL 25 MG PO TABS: 25.0000 mg | ORAL_TABLET | Freq: Four times a day (QID) | ORAL | 1 refills | Status: DC | PRN

## 2021-08-22 MED ORDER — ELETRIPTAN HYDROBROMIDE 40 MG PO TABS: 40.0000 mg | ORAL_TABLET | ORAL | 5 refills | Status: DC | PRN

## 2021-08-22 NOTE — Patient Instructions (Addendum)
Restart Zonegran working up to 100 mg at bedtime Recommend seeing your eye doctor for re-evaluation   Meds ordered this encounter  Medications   zonisamide (ZONEGRAN) 50 MG capsule    Sig: Take 1 capsule at bedtime for 1 week, then take 2 at bedtime    Dispense:  60 capsule    Refill:  6   eletriptan (RELPAX) 40 MG tablet    Sig: Take 1 tablet (40 mg total) by mouth as needed for migraine or headache. May repeat in 2 hours if headache persists or recurs.    Dispense:  12 tablet    Refill:  5   promethazine (PHENERGAN) 25 MG tablet    Sig: Take 1 tablet (25 mg total) by mouth every 6 (six) hours as needed for nausea or vomiting.    Dispense:  20 tablet    Refill:  1

## 2021-09-30 ENCOUNTER — Other Ambulatory Visit: Payer: Self-pay | Admitting: Neurology

## 2021-12-18 ENCOUNTER — Other Ambulatory Visit: Payer: Self-pay | Admitting: Neurology

## 2021-12-23 NOTE — Telephone Encounter (Signed)
Rx sent 

## 2022-01-14 NOTE — Progress Notes (Signed)
Patient: [name] Date of Birth: 10-Jun-1980  Reason for Visit: Follow up History from: Patient Primary Neurologist: [name]  ASSESSMENT AND PLAN 41 y.o. year old female   1.  History of pseudotumor cerebri 2.  Chronic migraine headaches -Restart Zonegran, working up to 100 mg at bedtime, if needed, will go back up to 100 mg twice a day -Try Relpax as needed for acute headache, may combine with Aleve, tizanidine, Phenergan for severe headache -In the past has not been able to tolerate Topamax, Diamox, Aimovig was too expensive, lack of benefit with Maxalt, Imitrex -Currently taking propanolol, doxepin, clonidine -Recent weight gain about 30 pounds in the last 6 weeks with the addition of doxepin, seeing PCP next week to discuss medication adjustment -Recommend seeing ophthalmology to ensure no return of papilledema, given significant weight gain over short  period of time, however she does not feel headaches are similar to prior IIH -Encouraged to reach out for any issues, otherwise follow-up in 4 to 6 months   HISTORY OF PRESENT ILLNESS:[name] is a 41 years old right-handed female, accompanied by her husband [name], seen in refer by her primary care physician nurse practitioner [name] for evaluation of pseudotumor cerebri, initial evaluation was on May 06 2016.   I saw her in 2011, She had a history of hypertension during pregnancy, postpartum depression, frequent headaches postpartum day, there was mild evidence of papillary edema on examination, she was referred for CT head without contrast that was normal in December 2011, lumbar puncture on February 06 2010, patient reported elevated open pressure 44 cm water, she later developed difficulty urination, low back pain, had MRI of lumbar on February 08 2010, that was normal.   I reviewed and summarized the referring note, reported a history of ADHD since age 41, only tolerate Adderall extended release, anxiety,  currently taking Wellbutrin 300 mg daily, recently started on BuSpar 15 mg twice a day, Klonopin 1 mg twice a day as needed, also taking instant release Adderall 5 mg one tablet in the afternoon   She  lost insurance has lost follow-up since 2011, today she came in complains of gradual worsening headaches since 2016, now she has daily left occipital headache, spreading forward to become left retro-orbital area severe pounding headache with associated light noise sensitivity, nauseous, she went to emergency room about 6 times over the past 12 months for headache treatment, she also had steady weight gain, complains of transient difficulty focusing with sudden body positional change. She denies tinnitus, no hearing loss.   UPDATE Jul 07 2016:YY She had a fluoroscopy guided lumbar puncture on May 07 2016, open pressure was 23 cm water, closing pressure was 12 cm water.   Post LP she developed severe positional related headache, require hospital admission, was treated with Morphine as needed, also received scheduled Percocet, Fioricet. She eventually had a blood patch on March twelfth 2018.   She could not tolerate Topamax, she complains of bilateral feet neuropathic pain, numbness tingling, also complains of alternating of the taste especially with soda, and her mouth feel raw, she could not tolerate it, she was put on Zonegran 100 mg twice a day since and of March 2018, she could not tolerate it better, she noticed improvement of her headaches.   Over the past 2 months, on average he has one or two headache each week, left-sided lateralized severe pounding headache with associated light noise sensitivity, nauseous, she is now taking Maxalt 10 mg, sometimes  Fioricet as needed, this will usually take care of the headache within one hour.   she is planning on to have bariatric surgery soon. have bariatric evaluation Jul 28 2016.   UPDATE October 29 2016:   Lumbar puncture in March 2018 showed open pressure  of 23 cm water, Laboratory evaluation showed normal BMP, INR, CBC, negative HIV,   She tolerated Zonegran well, if she is compliance with it, her migraine would be under better control.  She is planning on to have bariatric surgery. Imitrex injection as needed works well for her, she used average 6 injection every month.    She went to nutritional consultation, has lost 8 pounds over past 2 weeks,   UPDATE Feb 15 2019:  She stopped the Zonegran shortly after her weight loss surgery in September 2018, she lost more than 50 pounds,   Only recently she began to have increased migraine headaches again, couple times each week, starting at the neck, spreading forward become right retro-orbital area headache with associated light noise sensitivity, nauseous, lasting for few hours, Maxalt dissolvable provide limited help, she rely on Imitrex injection, which helps but not 100%, she also noticed significant burning pain with each injection, is very hesitant to inject   She denies visual change, weight has been stable, was followed by optometrist every year, there was no new concerns raised   With previous frequent headaches, pseudotumor cerebri, she was treated with Topamax, complains of unbearable side effect, numbness tingling, change of the taste, Diamox, also complains of discomfort in her tongue, feel acid all the time   She is also dealing with depression, fibromyalgia, with recent change to Lexapro   She was started on Zonegran since August 2020, with some improvement of her headaches, but she still have couple headaches each week,   UPDATE Feb 14 2021: She lost to follow-up since 2020, has all of her teeth pulled out in 2021, now has full denture, implant, her headache overall has improved, but still has mild major migraine once a week, lateralized severe headache, light and noise sensitivity, nauseous, worsened by movement.   She went to [facility] urgent care regularly to treat her headache, rely  on frequent over-the-counter medication, she has run out of her abortive, and preventive medication, previously was on aimovig 70 mg works well, also on Zomig,   Update August 22, 2021 SS: Never started back on the Aimovig, claims $700 a month even with insurance. Has a poor plan. Having 2-3 migraines a week, hard to get completely rid of. Takes Maxalt, ibuprofen/Tylenol. Saw Dr. [place] within the last year, no reports of papilledema. Started Doxepin, last month, has gained 30 lbs. Migraines are frontal, doesn't feel like IIH.  Update January 15, 2022 SS:   REVIEW OF SYSTEMS: Out of a complete 14 system review of symptoms, the patient complains only of the following symptoms, and all other reviewed systems are negative.  See HPI  ALLERGIES: Allergies  Allergen Reactions   Aspirin Other (See Comments)    Stomach pain Severe abdominal pain   Duloxetine Hcl Other (See Comments)    Other reaction(s): Edema   Lorazepam Other (See Comments)    "Can't remember anything while on it" aggressive    Naproxen Nausea And Vomiting and Other (See Comments)    cramping   Penicillins Hives    Has patient had a PCN reaction causing immediate rash, facial/tongue/throat swelling, SOB or lightheadedness with hypotension: No Has patient had a PCN reaction causing severe rash involving  mucus membranes or skin necrosis: No Has patient had a PCN reaction that required hospitalization No Has patient had a PCN reaction occurring within the last 10 years: No If all of the above answers are "NO", then may proceed with Cephalosporin use.   Cephalexin    Duloxetine Other (See Comments)    blisters   Buspar [Buspirone] Other (See Comments)    "felt weird, heart raced, cloudy"   Macrobid [Nitrofurantoin Monohyd Macro] Other (See Comments)    Stomach pain   Sudafed [Pseudoephedrine Hcl] Hives   Topiramate Other (See Comments)    Numbness/tingling in mouth/feet    HOME MEDICATIONS: Outpatient Medications Prior  to Visit  Medication Sig Dispense Refill   ALPRAZolam (XANAX XR) 0.5 MG 24 hr tablet Take 1 tablet by mouth daily.     CLONIDINE HCL PO Take 1 mg by mouth 3 (three) times daily.     diphenhydrAMINE (BENADRYL) 25 MG tablet Take 50 mg by mouth daily as needed for allergies.     doxepin (SINEQUAN) 100 MG capsule Take 100 mg by mouth at bedtime.     eletriptan (RELPAX) 40 MG tablet Take 1 tablet (40 mg total) by mouth as needed for migraine or headache. May repeat in 2 hours if headache persists or recurs. 12 tablet 5   furosemide (LASIX) 20 MG tablet Take 10-20 mg by mouth daily as needed.     hydrochlorothiazide (MICROZIDE) 12.5 MG capsule Take 12.5 mg by mouth as needed.     ondansetron (ZOFRAN ODT) 4 MG disintegrating tablet Take 1 tablet (4 mg total) by mouth every 8 (eight) hours as needed. 20 tablet 6   promethazine (PHENERGAN) 25 MG tablet Take 1 tablet (25 mg total) by mouth every 6 (six) hours as needed for nausea or vomiting. 20 tablet 1   propranolol ER (INDERAL LA) 60 MG 24 hr capsule Take 1 capsule by mouth daily.     sertraline (ZOLOFT) 100 MG tablet Take 1 tablet by mouth daily.     tiZANidine (ZANAFLEX) 4 MG tablet Take 1 tablet (4 mg total) by mouth every 6 (six) hours as needed for muscle spasms. 30 tablet 6   Vitamin D, Ergocalciferol, (DRISDOL) 1.25 MG (50000 UNIT) CAPS capsule Take 50,000 Units by mouth 2 (two) times a week.     zonisamide (ZONEGRAN) 50 MG capsule Take 1 capsule at bedtime for 1 week, then take 2 at bedtime 60 capsule 6   No facility-administered medications prior to visit.    PAST MEDICAL HISTORY: Past Medical History:  Diagnosis Date   ADHD (attention deficit hyperactivity disorder)    ADHD, adult residual type    Anxiety    Depression    Family history of adverse reaction to anesthesia    sister has PONV   GERD (gastroesophageal reflux disease)    Headache    otc med prn - last one 2 months ago   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    Pseudotumor cerebri     PAST SURGICAL HISTORY: Past Surgical History:  Procedure Laterality Date   ABDOMINAL HYSTERECTOMY     CESAREAN SECTION     x 2   CYSTOSCOPY N/A 06/19/2015   Procedure: CYSTOSCOPY;  Surgeon: Jaymes GraffNaima Dillard, MD;  Location: [facility];  Service: Gynecology;  Laterality: N/A;   [facility]   IR GENERIC HISTORICAL  05/12/2016   IR FLUORO GUIDED NEEDLE PLC ASPIRATION/INJECTION LOC 05/12/2016 Paulina FusiMark Shogry, MD [facility]   LAPAROSCOPIC VAGINAL  HYSTERECTOMY WITH SALPINGECTOMY Bilateral 06/19/2015   Procedure: LAPAROSCOPIC ASSISTED VAGINAL HYSTERECTOMY WITH SALPINGECTOMY;  Surgeon: Jaymes Graff, MD;  Location: [facility];  Service: Gynecology;  Laterality: Bilateral;   lumbar pucture     x2   SACROILIAC JOINT FUSION Left 10/28/2017   Procedure: SACROILIAC JOINT FUSION;  Surgeon: Venita Lick, MD;  Location: [facility] OR;  Service: Orthopedics;  Laterality: Left;  90 mins   TONSILLECTOMY      FAMILY HISTORY: Family History  Problem Relation Age of Onset   Pneumonia Mother        Sepsis   Heart attack Father     SOCIAL HISTORY: Social History   Socioeconomic History   Marital status: Married    Spouse name: Not on file   Number of children: 2   Years of education: cosmetology   Highest education level: Not on file  Occupational History   Occupation: Homemaker  Tobacco Use   Smoking status: Never   Smokeless tobacco: Never  Substance and Sexual Activity   Alcohol use: Yes    Comment: occasional liquor   Drug use: No   Sexual activity: Yes    Birth control/protection: None    Comment: Husband had vasectomy  Other Topics Concern   Not on file  Social History Narrative   Lives at home with her husband and children.   Right-handed.   3-4 cups caffeine per day.   Social Determinants of Health   Financial Resource Strain: Not on file  Food Insecurity: Not on file  Transportation Needs: Not on file  Physical Activity: Not on file   Stress: Not on file  Social Connections: Not on file  Intimate Partner Violence: Not on file    PHYSICAL EXAM  There were no vitals filed for this visit.  There is no height or weight on file to calculate BMI.  Generalized: Well developed, in no acute distress  Neurological examination  Mentation: Alert oriented to time, place, history taking. Follows all commands speech and language fluent Cranial nerve II-XII: Pupils were equal round reactive to light. Extraocular movements were full, visual field were full on confrontational test. Facial sensation and strength were normal. Head turning and shoulder shrug  were normal and symmetric. Motor: The motor testing reveals 5 over 5 strength of all 4 extremities. Good symmetric motor tone is noted throughout.  Sensory: Sensory testing is intact to soft touch on all 4 extremities. No evidence of extinction is noted.  Coordination: Cerebellar testing reveals good finger-nose-finger and heel-to-shin bilaterally.  Gait and station: Gait is normal.   Reflexes: Deep tendon reflexes are symmetric and normal bilaterally.   DIAGNOSTIC DATA (LABS, IMAGING, TESTING) - I reviewed patient records, labs, notes, testing and imaging myself where available.  Lab Results  Component Value Date   WBC 7.7 02/18/2018   HGB 11.8 (L) 02/18/2018   HCT 37.1 02/18/2018   MCV 99.5 02/18/2018   PLT 310 02/18/2018      Component Value Date/Time   NA 143 02/18/2018 2006   K 4.0 02/18/2018 2006   CL 109 02/18/2018 2006   CO2 25 02/18/2018 2006   GLUCOSE 98 02/18/2018 2006   BUN 13 02/18/2018 2006   CREATININE 0.71 02/18/2018 2006   CALCIUM 8.9 02/18/2018 2006   PROT 7.6 12/11/2010 2035   ALBUMIN 4.0 12/11/2010 2035   AST 21 12/11/2010 2035   ALT 22 12/11/2010 2035   ALKPHOS 86 12/11/2010 2035   BILITOT 0.2 (L) 12/11/2010 2035   GFRNONAA >60 02/18/2018  2006   GFRAA >60 02/18/2018 2006   No results found for: "CHOL", "HDL", "LDLCALC", "LDLDIRECT",  "TRIG", "CHOLHDL" No results found for: "HGBA1C" No results found for: "VITAMINB12" No results found for: "TSH"  Margie Ege, AGNP-C, DNP 01/14/2022, 3:27 PM [facility] [address] [place], [place] [address] [phone]

## 2022-01-15 ENCOUNTER — Ambulatory Visit: Payer: BC Managed Care – PPO | Admitting: Neurology

## 2022-01-15 ENCOUNTER — Encounter: Payer: Self-pay | Admitting: Neurology

## 2022-01-15 VITALS — BP 114/61 | HR 62 | Ht 64.0 in | Wt 243.0 lb

## 2022-01-15 DIAGNOSIS — G43709 Chronic migraine without aura, not intractable, without status migrainosus: Secondary | ICD-10-CM

## 2022-01-15 MED ORDER — ZONISAMIDE 100 MG PO CAPS: 100.0000 mg | ORAL_CAPSULE | Freq: Every day | ORAL | 3 refills | Status: DC

## 2022-01-15 MED ORDER — NARATRIPTAN HCL 2.5 MG PO TABS: 2.5000 mg | ORAL_TABLET | ORAL | 5 refills | Status: DC | PRN

## 2022-01-15 NOTE — Patient Instructions (Signed)
Try the Amerge for acute headache treatment Continue the Zonegran  Please see your eye doctor for evaluation Work on weight loss  See you back in 6 months

## 2022-01-22 ENCOUNTER — Ambulatory Visit: Payer: BC Managed Care – PPO | Admitting: Neurology

## 2022-01-29 ENCOUNTER — Other Ambulatory Visit: Payer: Self-pay | Admitting: Neurology

## 2022-03-18 ENCOUNTER — Telehealth: Payer: Self-pay | Admitting: Neurology

## 2022-03-18 NOTE — Telephone Encounter (Signed)
Per pt, insurance will not cover aimovig, but stated that they would cover amitriptyline. Pt would like to know if this medication could be an option for her instead of aimovig

## 2022-03-19 NOTE — Telephone Encounter (Signed)
Noted, thank you

## 2022-03-19 NOTE — Telephone Encounter (Signed)
I called the patient. She switched insurances, now has [place]. I don't think she is talking about amitriptyline, we think they meant another CGRP. She is going to follow-up with them.

## 2022-07-21 ENCOUNTER — Encounter: Payer: Self-pay | Admitting: Neurology

## 2022-07-21 ENCOUNTER — Ambulatory Visit: Payer: Self-pay | Admitting: Neurology

## 2022-07-21 NOTE — Progress Notes (Deleted)
Patient: [name] Date of Birth: 05-Jan-1981  Reason for Visit: Follow up History from: Patient Primary Neurologist: Terrace Arabia  ASSESSMENT AND PLAN 42 y.o. year old female   1.  History of pseudotumor cerebri 2.  Chronic migraine headaches -Continue Zonegran 100 mg at bedtime -Try Amerge 2.5 mg for acute headache treatment -Tried and failed: Topamax, Diamox, Aimovig (expensive), Maxalt, Imitrex; currently taking Inderal -Needs to follow-up with ophthalmology -Discussed the importance of weight loss for long-term management of pseudotumor -Follow-up in 6 months or sooner if needed, encouraged to reach out for medication adjustment, we can consider Nurtec or Ubrelvy in the future   HISTORY OF PRESENT ILLNESS:[name] is a 41 years old right-handed female, accompanied by her husband [name], seen in refer by her primary care physician nurse practitioner [name] for evaluation of pseudotumor cerebri, initial evaluation was on May 06 2016.   I saw her in 2011, She had a history of hypertension during pregnancy, postpartum depression, frequent headaches postpartum day, there was mild evidence of papillary edema on examination, she was referred for CT head without contrast that was normal in December 2011, lumbar puncture on February 06 2010, patient reported elevated open pressure 44 cm water, she later developed difficulty urination, low back pain, had MRI of lumbar on February 08 2010, that was normal.   I reviewed and summarized the referring note, reported a history of ADHD since age 55, only tolerate Adderall extended release, anxiety, currently taking Wellbutrin 300 mg daily, recently started on BuSpar 15 mg twice a day, Klonopin 1 mg twice a day as needed, also taking instant release Adderall 5 mg one tablet in the afternoon   She  lost insurance has lost follow-up since 2011, today she came in complains of gradual worsening headaches since 2016, now she has daily  left occipital headache, spreading forward to become left retro-orbital area severe pounding headache with associated light noise sensitivity, nauseous, she went to emergency room about 6 times over the past 12 months for headache treatment, she also had steady weight gain, complains of transient difficulty focusing with sudden body positional change. She denies tinnitus, no hearing loss.   UPDATE Jul 07 2016:YY She had a fluoroscopy guided lumbar puncture on May 07 2016, open pressure was 23 cm water, closing pressure was 12 cm water.   Post LP she developed severe positional related headache, require hospital admission, was treated with Morphine as needed, also received scheduled Percocet, Fioricet. She eventually had a blood patch on March twelfth 2018.   She could not tolerate Topamax, she complains of bilateral feet neuropathic pain, numbness tingling, also complains of alternating of the taste especially with soda, and her mouth feel raw, she could not tolerate it, she was put on Zonegran 100 mg twice a day since and of March 2018, she could not tolerate it better, she noticed improvement of her headaches.   Over the past 2 months, on average he has one or two headache each week, left-sided lateralized severe pounding headache with associated light noise sensitivity, nauseous, she is now taking Maxalt 10 mg, sometimes Fioricet as needed, this will usually take care of the headache within one hour.   she is planning on to have bariatric surgery soon. have bariatric evaluation Jul 28 2016.   UPDATE October 29 2016:   Lumbar puncture in March 2018 showed open pressure of 23 cm water, Laboratory evaluation showed normal BMP, INR, CBC, negative HIV,   She tolerated Zonegran  well, if she is compliance with it, her migraine would be under better control.  She is planning on to have bariatric surgery. Imitrex injection as needed works well for her, she used average 6 injection every month.    She  went to nutritional consultation, has lost 8 pounds over past 2 weeks,   UPDATE Feb 15 2019:  She stopped the Zonegran shortly after her weight loss surgery in September 2018, she lost more than 50 pounds,   Only recently she began to have increased migraine headaches again, couple times each week, starting at the neck, spreading forward become right retro-orbital area headache with associated light noise sensitivity, nauseous, lasting for few hours, Maxalt dissolvable provide limited help, she rely on Imitrex injection, which helps but not 100%, she also noticed significant burning pain with each injection, is very hesitant to inject   She denies visual change, weight has been stable, was followed by optometrist every year, there was no new concerns raised   With previous frequent headaches, pseudotumor cerebri, she was treated with Topamax, complains of unbearable side effect, numbness tingling, change of the taste, Diamox, also complains of discomfort in her tongue, feel acid all the time   She is also dealing with depression, fibromyalgia, with recent change to Lexapro   She was started on Zonegran since August 2020, with some improvement of her headaches, but she still have couple headaches each week,   UPDATE Feb 14 2021: She lost to follow-up since 2020, has all of her teeth pulled out in 2021, now has full denture, implant, her headache overall has improved, but still has mild major migraine once a week, lateralized severe headache, light and noise sensitivity, nauseous, worsened by movement.   She went to [facility] urgent care regularly to treat her headache, rely on frequent over-the-counter medication, she has run out of her abortive, and preventive medication, previously was on aimovig 70 mg works well, also on Zomig,   Update August 22, 2021 SS: Never started back on the Aimovig, claims $700 a month even with insurance. Has a poor plan. Having 2-3 migraines a week, hard to get completely  rid of. Takes Maxalt, ibuprofen/Tylenol. Saw Dr. [place] within the last year, no reports of papilledema. Started Doxepin, last month, has gained 30 lbs. Migraines are frontal, doesn't feel like IIH.  Update January 15, 2022 SS: Weight is up 20 lbs since last visit. Once a month will have migraine, last 2-3 days. Will take Relpax, it helps to dull it, but doesn't completely relieve, Imitrex injection works best but doesn't like it. Signing up for new insurance, Aimovig worked well but too expensive. Back on Zonegran taking 100 mg at bedtime. Has not been back to eye doctor. 5 years ago had gastric sleeve, considering newer injectable medications for weight loss. No change in vision, denies any IIH symptoms.   Update Jul 21, 2022 SS:   REVIEW OF SYSTEMS: Out of a complete 14 system review of symptoms, the patient complains only of the following symptoms, and all other reviewed systems are negative.  See HPI  ALLERGIES: Allergies  Allergen Reactions   Aspirin Other (See Comments)    Stomach pain Severe abdominal pain   Duloxetine Hcl Other (See Comments)    Other reaction(s): Edema   Lorazepam Other (See Comments)    "Can't remember anything while on it" aggressive    Naproxen Nausea And Vomiting and Other (See Comments)    cramping   Penicillins Hives    Has  patient had a PCN reaction causing immediate rash, facial/tongue/throat swelling, SOB or lightheadedness with hypotension: No Has patient had a PCN reaction causing severe rash involving mucus membranes or skin necrosis: No Has patient had a PCN reaction that required hospitalization No Has patient had a PCN reaction occurring within the last 10 years: No If all of the above answers are "NO", then may proceed with Cephalosporin use.   Cephalexin    Duloxetine Other (See Comments)    blisters   Buspar [Buspirone] Other (See Comments)    "felt weird, heart raced, cloudy"   Macrobid [Nitrofurantoin Monohyd Macro] Other (See Comments)     Stomach pain   Sudafed [Pseudoephedrine Hcl] Hives   Topiramate Other (See Comments)    Numbness/tingling in mouth/feet    HOME MEDICATIONS: Outpatient Medications Prior to Visit  Medication Sig Dispense Refill   ALPRAZolam (XANAX XR) 0.5 MG 24 hr tablet Take 1 tablet by mouth daily.     CLONIDINE HCL PO Take 1 mg by mouth 3 (three) times daily.     diphenhydrAMINE (BENADRYL) 25 MG tablet Take 50 mg by mouth daily as needed for allergies.     furosemide (LASIX) 20 MG tablet Take 10-20 mg by mouth daily as needed.     hydrochlorothiazide (MICROZIDE) 12.5 MG capsule Take 12.5 mg by mouth as needed.     naratriptan (AMERGE) 2.5 MG tablet Take 1 tablet (2.5 mg total) by mouth as needed for migraine. Take one (1) tablet at onset of headache; if returns or does not resolve, may repeat after 4 hours; do not exceed five (5) mg in 24 hours. 10 tablet 5   ondansetron (ZOFRAN ODT) 4 MG disintegrating tablet Take 1 tablet (4 mg total) by mouth every 8 (eight) hours as needed. 20 tablet 6   promethazine (PHENERGAN) 25 MG tablet Take 1 tablet (25 mg total) by mouth every 6 (six) hours as needed for nausea or vomiting. 20 tablet 1   propranolol ER (INDERAL LA) 60 MG 24 hr capsule Take 1 capsule by mouth daily.     sertraline (ZOLOFT) 100 MG tablet Take 1 tablet by mouth daily.     tiZANidine (ZANAFLEX) 4 MG tablet Take 1 tablet (4 mg total) by mouth every 6 (six) hours as needed for muscle spasms. 30 tablet 6   Vitamin D, Ergocalciferol, (DRISDOL) 1.25 MG (50000 UNIT) CAPS capsule Take 50,000 Units by mouth 2 (two) times a week.     zonisamide (ZONEGRAN) 100 MG capsule Take 1 capsule (100 mg total) by mouth at bedtime. 90 capsule 3   No facility-administered medications prior to visit.    PAST MEDICAL HISTORY: Past Medical History:  Diagnosis Date   ADHD (attention deficit hyperactivity disorder)    ADHD, adult residual type    Anxiety    Depression    Family history of adverse reaction to  anesthesia    sister has PONV   GERD (gastroesophageal reflux disease)    Headache    otc med prn - last one 2 months ago   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   Pseudotumor cerebri     PAST SURGICAL HISTORY: Past Surgical History:  Procedure Laterality Date   ABDOMINAL HYSTERECTOMY     CESAREAN SECTION     x 2   CYSTOSCOPY N/A 06/19/2015   Procedure: CYSTOSCOPY;  Surgeon: Jaymes Graff, MD;  Location: [facility];  Service: Gynecology;  Laterality: N/A;   GASTRECTOMY     sleeve, [facility]   IR  GENERIC HISTORICAL  05/12/2016   IR FLUORO GUIDED NEEDLE PLC ASPIRATION/INJECTION LOC 05/12/2016 Paulina Fusi, MD [facility]   LAPAROSCOPIC VAGINAL HYSTERECTOMY WITH SALPINGECTOMY Bilateral 06/19/2015   Procedure: LAPAROSCOPIC ASSISTED VAGINAL HYSTERECTOMY WITH SALPINGECTOMY;  Surgeon: Jaymes Graff, MD;  Location: [facility];  Service: Gynecology;  Laterality: Bilateral;   lumbar pucture     x2   SACROILIAC JOINT FUSION Left 10/28/2017   Procedure: SACROILIAC JOINT FUSION;  Surgeon: Venita Lick, MD;  Location[facility];  Service: Orthopedics;  Laterality: Left;  90 mins   TONSILLECTOMY      FAMILY HISTORY: Family History  Problem Relation Age of Onset   Pneumonia Mother        Sepsis   Heart attack Father     SOCIAL HISTORY: Social History   Socioeconomic History   Marital status: Married    Spouse name: Not on file   Number of children: 2   Years of education: cosmetology   Highest education level: Not on file  Occupational History   Occupation: Homemaker  Tobacco Use   Smoking status: Never   Smokeless tobacco: Never  Substance and Sexual Activity   Alcohol use: Yes    Comment: occasional liquor   Drug use: No   Sexual activity: Yes    Birth control/protection: None    Comment: Husband had vasectomy  Other Topics Concern   Not on file  Social History Narrative   Lives at home with her husband and children.   Right-handed.   3-4 cups caffeine per day.   Social  Determinants of Health   Financial Resource Strain: Not on file  Food Insecurity: Not on file  Transportation Needs: Not on file  Physical Activity: Not on file  Stress: Not on file  Social Connections: Not on file  Intimate Partner Violence: Not on file   PHYSICAL EXAM  There were no vitals filed for this visit.  There is no height or weight on file to calculate BMI.  Generalized: Well developed, in no acute distress  Neurological examination  Mentation: Alert oriented to time, place, history taking. Follows all commands speech and language fluent Cranial nerve II-XII: Pupils were equal round reactive to light. Extraocular movements were full, visual field were full on confrontational test. Facial sensation and strength were normal. Head turning and shoulder shrug  were normal and symmetric. Motor: The motor testing reveals 5 over 5 strength of all 4 extremities. Good symmetric motor tone is noted throughout.  Sensory: Sensory testing is intact to soft touch on all 4 extremities. No evidence of extinction is noted.  Coordination: Cerebellar testing reveals good finger-nose-finger and heel-to-shin bilaterally.  Gait and station: Gait is normal.   Reflexes: Deep tendon reflexes are symmetric and normal bilaterally.   DIAGNOSTIC DATA (LABS, IMAGING, TESTING) - I reviewed patient records, labs, notes, testing and imaging myself where available.  Lab Results  Component Value Date   WBC 7.7 02/18/2018   HGB 11.8 (L) 02/18/2018   HCT 37.1 02/18/2018   MCV 99.5 02/18/2018   PLT 310 02/18/2018      Component Value Date/Time   NA 143 02/18/2018 2006   K 4.0 02/18/2018 2006   CL 109 02/18/2018 2006   CO2 25 02/18/2018 2006   GLUCOSE 98 02/18/2018 2006   BUN 13 02/18/2018 2006   CREATININE 0.71 02/18/2018 2006   CALCIUM 8.9 02/18/2018 2006   PROT 7.6 12/11/2010 2035   ALBUMIN 4.0 12/11/2010 2035   AST 21 12/11/2010 2035  ALT 22 12/11/2010 2035   ALKPHOS 86 12/11/2010 2035    BILITOT 0.2 (L) 12/11/2010 2035   GFRNONAA >60 02/18/2018 2006   GFRAA >60 02/18/2018 2006   No results found for: "CHOL", "HDL", "LDLCALC", "LDLDIRECT", "TRIG", "CHOLHDL" No results found for: "HGBA1C" No results found for: "VITAMINB12" No results found for: "TSH"  Margie Ege, AGNP-C, DNP 07/21/2022, 5:17 AM [facility] [address], [address] [place], [place] [address] [phone]

## 2022-07-24 ENCOUNTER — Ambulatory Visit: Payer: BC Managed Care – PPO | Admitting: Neurology

## 2022-09-16 ENCOUNTER — Encounter (HOSPITAL_BASED_OUTPATIENT_CLINIC_OR_DEPARTMENT_OTHER): Payer: Self-pay | Admitting: *Deleted

## 2022-09-16 ENCOUNTER — Other Ambulatory Visit: Payer: Self-pay

## 2022-09-16 ENCOUNTER — Emergency Department (HOSPITAL_BASED_OUTPATIENT_CLINIC_OR_DEPARTMENT_OTHER)
Admission: EM | Admit: 2022-09-16 | Discharge: 2022-09-16 | Disposition: A | Discharge status: Home / Self Care | Payer: Commercial Managed Care - HMO | Attending: Emergency Medicine | Admitting: Emergency Medicine

## 2022-09-16 DIAGNOSIS — R42 Dizziness and giddiness: Secondary | ICD-10-CM | POA: Insufficient documentation

## 2022-09-16 LAB — CBC WITH DIFFERENTIAL/PLATELET
Abs Immature Granulocytes: 0.12 10*3/uL — ABNORMAL HIGH (ref 0.00–0.07)
Basophils Absolute: 0.1 10*3/uL (ref 0.0–0.1)
Basophils Relative: 0 %
Eosinophils Absolute: 0 10*3/uL (ref 0.0–0.5)
Eosinophils Relative: 0 %
HCT: 45.2 % (ref 36.0–46.0)
Hemoglobin: 15.4 g/dL — ABNORMAL HIGH (ref 12.0–15.0)
Immature Granulocytes: 1 %
Lymphocytes Relative: 13 %
Lymphs Abs: 2.4 10*3/uL (ref 0.7–4.0)
MCH: 31.6 pg (ref 26.0–34.0)
MCHC: 34.1 g/dL (ref 30.0–36.0)
MCV: 92.8 fL (ref 80.0–100.0)
Monocytes Absolute: 1.1 10*3/uL — ABNORMAL HIGH (ref 0.1–1.0)
Monocytes Relative: 6 %
Neutro Abs: 15 10*3/uL — ABNORMAL HIGH (ref 1.7–7.7)
Neutrophils Relative %: 80 %
Platelets: 519 10*3/uL — ABNORMAL HIGH (ref 150–400)
RBC: 4.87 MIL/uL (ref 3.87–5.11)
RDW: 13.7 % (ref 11.5–15.5)
WBC: 18.7 10*3/uL — ABNORMAL HIGH (ref 4.0–10.5)
nRBC: 0 % (ref 0.0–0.2)

## 2022-09-16 LAB — BASIC METABOLIC PANEL
Anion gap: 13 (ref 5–15)
BUN: 14 mg/dL (ref 6–20)
CO2: 21 mmol/L — ABNORMAL LOW (ref 22–32)
Calcium: 10 mg/dL (ref 8.9–10.3)
Chloride: 101 mmol/L (ref 98–111)
Creatinine, Ser: 0.8 mg/dL (ref 0.44–1.00)
GFR, Estimated: >60 mL/min (ref >60)
Glucose, Bld: 134 mg/dL — ABNORMAL HIGH (ref 70–99)
Potassium: 4.5 mmol/L (ref 3.5–5.1)
Sodium: 135 mmol/L (ref 135–145)

## 2022-09-16 MED ORDER — CLONIDINE HCL 0.1 MG PO TABS: 0.1000 mg | ORAL_TABLET | Freq: Three times a day (TID) | ORAL | 0 refills | Status: AC

## 2022-09-16 MED ORDER — ONDANSETRON HCL 4 MG/2ML IJ SOLN
4.0000 mg | Freq: Once | INTRAMUSCULAR | Status: AC
  Administered 2022-09-16: 4 mg via INTRAVENOUS
  Filled 2022-09-16: qty 2

## 2022-09-16 MED ORDER — CLONIDINE HCL 0.1 MG PO TABS: 0.1000 mg | ORAL_TABLET | Freq: Three times a day (TID) | ORAL | 0 refills | Status: DC

## 2022-09-16 MED ORDER — CLONIDINE HCL 0.1 MG PO TABS
0.1000 mg | ORAL_TABLET | Freq: Once | ORAL | Status: AC
  Administered 2022-09-16: 0.1 mg via ORAL
  Filled 2022-09-16: qty 1

## 2022-09-16 MED ORDER — ONDANSETRON 8 MG PO TBDP: ORAL_TABLET | ORAL | 0 refills | Status: DC

## 2022-09-16 NOTE — ED Triage Notes (Signed)
Pt has been out of clonidine since ~Sunday night, has been on medication for 6-8 months. C/o Nausea, dizziness, and not feeling well

## 2022-09-16 NOTE — Discharge Instructions (Signed)
Begin taking clonidine as previously prescribed.  Begin taking Zofran as prescribed as needed for nausea.  Follow-up with your primary doctor if symptoms or not improving in the next few days.

## 2022-09-16 NOTE — ED Provider Notes (Signed)
[facility] AT [facility] Provider Note   CSN: [id number] Arrival date & time: 09/16/22  0321     History  No chief complaint on file.   [name] is a 42 y.o. female.  Patient is a 42 year old female with past medical history of anxiety, hypertension.  Patient presented today with complaints of dizziness and feeling unwell.  She ran out of her clonidine over 24 hours ago which she normally takes 3 times a day.  She takes this medication for anxiety.  She denies any fevers or chills.  She denies any chest pain or difficulty breathing.  She states that she feels like she is spinning.  No weakness or numbness.  No visual disturbances.  No aggravating or alleviating factors.  The history is provided by the patient.       Home Medications Prior to Admission medications   Medication Sig Start Date End Date Taking? Authorizing Provider  ALPRAZolam (XANAX XR) 0.5 MG 24 hr tablet Take 1 tablet by mouth daily. 04/22/19   [provider]  CLONIDINE HCL PO Take 1 mg by mouth 3 (three) times daily.    [provider]  diphenhydrAMINE (BENADRYL) 25 MG tablet Take 50 mg by mouth daily as needed for allergies.    [provider]  furosemide (LASIX) 20 MG tablet Take 10-20 mg by mouth daily as needed. 05/12/21   [provider]  hydrochlorothiazide (MICROZIDE) 12.5 MG capsule Take 12.5 mg by mouth as needed. 09/20/18   [provider]  naratriptan (AMERGE) 2.5 MG tablet Take 1 tablet (2.5 mg total) by mouth as needed for migraine. Take one (1) tablet at onset of headache; if returns or does not resolve, may repeat after 4 hours; do not exceed five (5) mg in 24 hours. 01/15/22   Glean Salvo, NP  ondansetron (ZOFRAN ODT) 4 MG disintegrating tablet Take 1 tablet (4 mg total) by mouth every 8 (eight) hours as needed. 02/15/19   Levert Feinstein, MD  promethazine (PHENERGAN) 25 MG tablet Take 1 tablet (25 mg total) by mouth  every 6 (six) hours as needed for nausea or vomiting. 12/23/21   Glean Salvo, NP  propranolol ER (INDERAL LA) 60 MG 24 hr capsule Take 1 capsule by mouth daily. 07/28/21   [provider]  sertraline (ZOLOFT) 100 MG tablet Take 1 tablet by mouth daily. 11/26/20   [provider]  tiZANidine (ZANAFLEX) 4 MG tablet Take 1 tablet (4 mg total) by mouth every 6 (six) hours as needed for muscle spasms. 05/29/21   Levert Feinstein, MD  Vitamin D, Ergocalciferol, (DRISDOL) 1.25 MG (50000 UNIT) CAPS capsule Take 50,000 Units by mouth 2 (two) times a week.    [provider]  zonisamide (ZONEGRAN) 100 MG capsule Take 1 capsule (100 mg total) by mouth at bedtime. 01/15/22   Glean Salvo, NP      Allergies    Aspirin, Duloxetine hcl, Lorazepam, Naproxen, Penicillins, Cephalexin, Duloxetine, Buspar [buspirone], Macrobid [nitrofurantoin monohyd macro], Sudafed [pseudoephedrine hcl], and Topiramate    Review of Systems   Review of Systems  All other systems reviewed and are negative.   Physical Exam Updated Vital Signs BP (!) 158/113 (BP Location: Right Arm)   Pulse 97   Temp 98 F (36.7 C)   Resp 16   LMP 06/12/2015 Comment: negative pregnancy test result today.  SpO2 100%  Physical Exam Vitals and nursing note reviewed.  Constitutional:  General: She is not in acute distress.    Appearance: She is well-developed. She is not diaphoretic.  HENT:     Head: Normocephalic and atraumatic.  Eyes:     Extraocular Movements: Extraocular movements intact.     Pupils: Pupils are equal, round, and reactive to light.  Cardiovascular:     Rate and Rhythm: Normal rate and regular rhythm.     Heart sounds: No murmur heard.    No friction rub. No gallop.  Pulmonary:     Effort: Pulmonary effort is normal. No respiratory distress.     Breath sounds: Normal breath sounds. No wheezing.  Abdominal:     General: Bowel sounds are normal. There is no distension.     Palpations:  Abdomen is soft.     Tenderness: There is no abdominal tenderness.  Musculoskeletal:        General: Normal range of motion.     Cervical back: Normal range of motion and neck supple.  Skin:    General: Skin is warm and dry.  Neurological:     General: No focal deficit present.     Mental Status: She is alert and oriented to person, place, and time. Mental status is at baseline.     Cranial Nerves: No cranial nerve deficit.     Motor: No weakness.     ED Results / Procedures / Treatments   Labs (all labs ordered are listed, but only abnormal results are displayed) Labs Reviewed  BASIC METABOLIC PANEL  CBC WITH DIFFERENTIAL/PLATELET    EKG None  Radiology No results found.  Procedures Procedures    Medications Ordered in ED Medications  cloNIDine (CATAPRES) tablet 0.1 mg (has no administration in time range)    ED Course/ Medical Decision Making/ A&P  Patient presenting here with complaints of dizziness as described in the HPI.  This began after running out of her clonidine.  She also feels nauseated.  Patient arrives here with stable vital signs and is afebrile.  Physical examination is unremarkable and neurologic exam is nonfocal.  Laboratory studies obtained including CBC and basic metabolic panel.  She has a leukocytosis with white count of 18.6, the etiology of which I am uncertain.  She has no other complaints that would explain this.  Patient has received clonidine for blood pressure/anxiety and Zofran for nausea.  At this point, I feel as though her symptoms are most likely related to withdrawal from clonidine.  She had been taking 0.2 mg 3 times a day to help with her anxiety.  This will be represcribed for her and I will have her follow-up with her primary doctor.  Final Clinical Impression(s) / ED Diagnoses Final diagnoses:  None    Rx / DC Orders ED Discharge Orders     None         Geoffery Lyons, MD 09/16/22 [phone]

## 2022-11-08 ENCOUNTER — Emergency Department (HOSPITAL_COMMUNITY)
Admission: EM | Admit: 2022-11-08 | Discharge: 2022-11-08 | Disposition: A | Discharge status: Home / Self Care | Payer: Commercial Managed Care - HMO | Attending: Emergency Medicine | Admitting: Emergency Medicine

## 2022-11-08 ENCOUNTER — Other Ambulatory Visit: Payer: Self-pay

## 2022-11-08 ENCOUNTER — Encounter (HOSPITAL_COMMUNITY): Payer: Self-pay | Admitting: Emergency Medicine

## 2022-11-08 DIAGNOSIS — R519 Headache, unspecified: Secondary | ICD-10-CM | POA: Insufficient documentation

## 2022-11-08 MED ORDER — DEXAMETHASONE SODIUM PHOSPHATE 10 MG/ML IJ SOLN
10.0000 mg | Freq: Once | INTRAMUSCULAR | Status: AC
  Administered 2022-11-08: 10 mg via INTRAVENOUS
  Filled 2022-11-08: qty 1

## 2022-11-08 MED ORDER — LACTATED RINGERS IV BOLUS
1000.0000 mL | Freq: Once | INTRAVENOUS | Status: AC
  Administered 2022-11-08: 1000 mL via INTRAVENOUS

## 2022-11-08 MED ORDER — KETOROLAC TROMETHAMINE 15 MG/ML IJ SOLN
15.0000 mg | Freq: Once | INTRAMUSCULAR | Status: AC
  Administered 2022-11-08: 15 mg via INTRAVENOUS
  Filled 2022-11-08: qty 1

## 2022-11-08 MED ORDER — DIPHENHYDRAMINE HCL 50 MG/ML IJ SOLN
25.0000 mg | Freq: Once | INTRAMUSCULAR | Status: AC
  Administered 2022-11-08: 25 mg via INTRAVENOUS
  Filled 2022-11-08: qty 1

## 2022-11-08 MED ORDER — PROCHLORPERAZINE EDISYLATE 10 MG/2ML IJ SOLN
10.0000 mg | INTRAMUSCULAR | Status: AC
  Administered 2022-11-08: 10 mg via INTRAVENOUS
  Filled 2022-11-08: qty 2

## 2022-11-08 NOTE — ED Triage Notes (Signed)
Pt reports migraine headache and nausea x 6 days. PT reports taking her home meds with no relief.

## 2022-11-08 NOTE — ED Provider Notes (Signed)
[place] EMERGENCY DEPARTMENT AT [facility] Provider Note   CSN: [id number] Arrival date & time: 11/08/22  1804     History {Add pertinent medical, surgical, social history, OB history to HPI:1} Chief Complaint  Patient presents with   Migraine    [name] is a 42 y.o. female.   Migraine       Home Medications Prior to Admission medications   Medication Sig Start Date End Date Taking? Authorizing Provider  ALPRAZolam (XANAX XR) 0.5 MG 24 hr tablet Take 1 tablet by mouth daily. 04/22/19   [provider]  cloNIDine (CATAPRES) 0.1 MG tablet Take 1 tablet (0.1 mg total) by mouth 3 (three) times daily. 09/16/22   Geoffery Lyons, MD  diphenhydrAMINE (BENADRYL) 25 MG tablet Take 50 mg by mouth daily as needed for allergies.    [provider]  furosemide (LASIX) 20 MG tablet Take 10-20 mg by mouth daily as needed. 05/12/21   [provider]  hydrochlorothiazide (MICROZIDE) 12.5 MG capsule Take 12.5 mg by mouth as needed. 09/20/18   [provider]  naratriptan (AMERGE) 2.5 MG tablet Take 1 tablet (2.5 mg total) by mouth as needed for migraine. Take one (1) tablet at onset of headache; if returns or does not resolve, may repeat after 4 hours; do not exceed five (5) mg in 24 hours. 01/15/22   Glean Salvo, NP  ondansetron (ZOFRAN-ODT) 8 MG disintegrating tablet 8mg  ODT q4 hours prn nausea 09/16/22   Geoffery Lyons, MD  promethazine (PHENERGAN) 25 MG tablet Take 1 tablet (25 mg total) by mouth every 6 (six) hours as needed for nausea or vomiting. 12/23/21   Glean Salvo, NP  propranolol ER (INDERAL LA) 60 MG 24 hr capsule Take 1 capsule by mouth daily. 07/28/21   [provider]  sertraline (ZOLOFT) 100 MG tablet Take 1 tablet by mouth daily. 11/26/20   [provider]  tiZANidine (ZANAFLEX) 4 MG tablet Take 1 tablet (4 mg total) by mouth every 6 (six) hours as needed for muscle spasms. 05/29/21   Levert Feinstein, MD   Vitamin D, Ergocalciferol, (DRISDOL) 1.25 MG (50000 UNIT) CAPS capsule Take 50,000 Units by mouth 2 (two) times a week.    [provider]  zonisamide (ZONEGRAN) 100 MG capsule Take 1 capsule (100 mg total) by mouth at bedtime. 01/15/22   Glean Salvo, NP      Allergies    Aspirin, Duloxetine hcl, Lorazepam, Naproxen, Penicillins, Cephalexin, Duloxetine, Buspar [buspirone], Macrobid [nitrofurantoin monohyd macro], Sudafed [pseudoephedrine hcl], and Topiramate    Review of Systems   Review of Systems  Physical Exam Updated Vital Signs BP (!) 124/98 (BP Location: Left Arm)   Pulse 85   Temp 97.9 F (36.6 C) (Oral)   Resp 18   LMP 06/12/2015 Comment: negative pregnancy test result today.  SpO2 100%  Physical Exam  ED Results / Procedures / Treatments   Labs (all labs ordered are listed, but only abnormal results are displayed) Labs Reviewed - No data to display  EKG None  Radiology No results found.  Procedures Procedures  {Document cardiac monitor, telemetry assessment procedure when appropriate:1}  Medications Ordered in ED Medications  dexamethasone (DECADRON) injection 10 mg (10 mg Intravenous Given 11/08/22 1933)  lactated ringers bolus 1,000 mL (1,000 mLs Intravenous New Bag/Given 11/08/22 1932)  prochlorperazine (COMPAZINE) injection 10 mg (10 mg Intravenous Given 11/08/22 1934)  ketorolac (TORADOL) 15 MG/ML injection 15 mg (15 mg Intravenous Given 11/08/22 1932)  ED Course/ Medical Decision Making/ A&P   {   Click here for ABCD2, HEART and other calculatorsREFRESH Note before signing :1}                              Medical Decision Making Risk Prescription drug management.   ***  {Document critical care time when appropriate:1} {Document review of labs and clinical decision tools ie heart score, Chads2Vasc2 etc:1}  {Document your independent review of radiology images, and any outside records:1} {Document your discussion with family members,  caretakers, and with consultants:1} {Document social determinants of health affecting pt's care:1} {Document your decision making why or why not admission, treatments were needed:1} Final Clinical Impression(s) / ED Diagnoses Final diagnoses:  None    Rx / DC Orders ED Discharge Orders     None

## 2022-11-08 NOTE — Discharge Instructions (Addendum)
You were seen in the ER today for evaluation of your headache.  I am glad that you are feeling better.  Please make sure to follow-up with your neurologist.  Please make sure you are staying well-hydrated drinking plenty of fluids, mainly water.  If you start to have any vision changes, weakness, trouble walking, trouble talking, worsening headache, neck stiffness, fever, please return to your nearest emerged department for evaluation.  If you have any other concerns, new or worsening symptoms, please return to your nearest emergency department for evaluation.  Contact a doctor if: Medicine does not help your migraine. Your pain keeps coming back even with medicine. Get help right away if: Your migraine becomes really bad and medicine does not help. You have a fever or stiff neck. You have trouble seeing. Your muscles are weak or you lose control of them. You lose your balance or have trouble walking. You feel like you may faint or you faint. You start having sudden, very bad headaches. You have a seizure.

## 2022-11-10 ENCOUNTER — Telehealth: Payer: Self-pay | Admitting: Neurology

## 2022-11-10 MED ORDER — NURTEC 75 MG PO TBDP: 75.0000 mg | ORAL_TABLET | ORAL | 11 refills | Status: DC | PRN

## 2022-11-10 NOTE — Telephone Encounter (Signed)
This encounter was created in error - please disregard.

## 2022-11-10 NOTE — Telephone Encounter (Signed)
Returned call to pt who stated that amege isn't working and would be happy to try either nurtec or ubrelvy.  Also scheduled her next appt on 11/18/22 I used hospital f/u spot since that was within seven business days.   Routing to slack np

## 2022-11-10 NOTE — Addendum Note (Signed)
Addended by: Glean Salvo on: 11/10/2022 09:47 PM   Modules accepted: Orders

## 2022-11-10 NOTE — Telephone Encounter (Signed)
Patient was in the ER 11/08/22 for migraine headache, given migraine cocktail with good improvement. She missed her last follow up appointment, is due for revisit. Please have her schedule for revisit, place on waitlist. Also, if Amege is not helping, we can offer Ubrelvy or Nurtec, whichever her insurance prefers to hopefully prevent her from going back to ER. Thanks

## 2022-11-10 NOTE — Telephone Encounter (Signed)
Okay thanks. I will send in Nurtec.   Meds ordered this encounter  Medications   Rimegepant Sulfate (NURTEC) 75 MG TBDP    Sig: Take 1 tablet (75 mg total) by mouth as needed. Take 1 tablet at onset of headache, max is 1 tablet in 24 hours.    Dispense:  8 tablet    Refill:  11   My Note 01/15/22:  1.  History of pseudotumor cerebri 2.  Chronic migraine headaches -Continue Zonegran 100 mg at bedtime -Try Amerge 2.5 mg for acute headache treatment -Tried and failed: Topamax, Diamox, Aimovig (expensive), Maxalt, Imitrex; currently taking Inderal -Needs to follow-up with ophthalmology -Discussed the importance of weight loss for long-term management of pseudotumor -Follow-up in 6 months or sooner if needed, encouraged to reach out for medication adjustment, we can consider Nurtec or Ubrelvy in the future

## 2022-11-13 NOTE — Progress Notes (Signed)
Patient: [name] Date of Birth: [birth year]  Reason for Visit: Follow up History from: Patient Primary Neurologist: [name]  ASSESSMENT AND PLAN 42 y.o. year old female   1.  History of pseudotumor cerebri 2.  Chronic migraine headaches -Start Emgality with loading dose for migraine prevention -Continue Zonegran 100 mg at bedtime -Try Nurtec 75 mg as needed for acute headache, may combine with Aleve, tizanidine for severe headache.  Has Phenergan if needed for nausea. -Needs to follow-up with ophthalmology given history of IIH -Discussed the importance of weight loss, key to long-term management of IIH -Tried and failed: Topamax, Diamox, Aimovig (expensive), Maxalt, Imitrex, Amerge; currently taking Inderal -Needs to follow-up with ophthalmology -Encouraged to reach out via MyChart if needed, follow-up with me in 6 months or sooner  Meds ordered this encounter  Medications   Galcanezumab-gnlm (EMGALITY) 120 MG/ML SOAJ    Sig: Inject 240 mg into the skin every 30 (thirty) days.    Dispense:  2 mL    Refill:  0    Dispense 2 pens for loading dose   Galcanezumab-gnlm (EMGALITY) 120 MG/ML SOAJ    Sig: Inject 120 mg into the skin every 30 (thirty) days.    Dispense:  1.12 mL    Refill:  11    Fill this 2nd   zonisamide (ZONEGRAN) 100 MG capsule    Sig: Take 1 capsule (100 mg total) by mouth at bedtime.    Dispense:  90 capsule    Refill:  3   tiZANidine (ZANAFLEX) 4 MG tablet    Sig: Take 1 tablet (4 mg total) by mouth every 6 (six) hours as needed for muscle spasms.    Dispense:  30 tablet    Refill:  1   Rimegepant Sulfate (NURTEC) 75 MG TBDP    Sig: Take 1 tablet (75 mg total) by mouth as needed. Take 1 tablet at onset of headache, max is 1 tablet in 24 hours.    Dispense:  8 tablet    Refill:  11   promethazine (PHENERGAN) 25 MG tablet    Sig: Take 1 tablet (25 mg total) by mouth every 6 (six) hours as needed for nausea or vomiting.    Dispense:  20 tablet     Refill:  1    HISTORY OF PRESENT ILLNESS:[name] is a 42 years old right-handed female, accompanied by her husband [name], seen in refer by her primary care physician nurse practitioner [name] for evaluation of pseudotumor cerebri, initial evaluation was on May 06 2016.   I saw her in 2011, She had a history of hypertension during pregnancy, postpartum depression, frequent headaches postpartum day, there was mild evidence of papillary edema on examination, she was referred for CT head without contrast that was normal in December 2011, lumbar puncture on February 06 2010, patient reported elevated open pressure 44 cm water, she later developed difficulty urination, low back pain, had MRI of lumbar on February 08 2010, that was normal.   I reviewed and summarized the referring note, reported a history of ADHD since age 50, only tolerate Adderall extended release, anxiety, currently taking Wellbutrin 300 mg daily, recently started on BuSpar 15 mg twice a day, Klonopin 1 mg twice a day as needed, also taking instant release Adderall 5 mg one tablet in the afternoon   She  lost insurance has lost follow-up since 2011, today she came in complains of gradual worsening headaches since 2016, now she has daily left  occipital headache, spreading forward to become left retro-orbital area severe pounding headache with associated light noise sensitivity, nauseous, she went to emergency room about 6 times over the past 12 months for headache treatment, she also had steady weight gain, complains of transient difficulty focusing with sudden body positional change. She denies tinnitus, no hearing loss.   UPDATE Jul 07 2016:YY She had a fluoroscopy guided lumbar puncture on May 07 2016, open pressure was 23 cm water, closing pressure was 12 cm water.   Post LP she developed severe positional related headache, require hospital admission, was treated with Morphine as needed, also received scheduled  Percocet, Fioricet. She eventually had a blood patch on March twelfth 2018.   She could not tolerate Topamax, she complains of bilateral feet neuropathic pain, numbness tingling, also complains of alternating of the taste especially with soda, and her mouth feel raw, she could not tolerate it, she was put on Zonegran 100 mg twice a day since and of March 2018, she could not tolerate it better, she noticed improvement of her headaches.   Over the past 2 months, on average he has one or two headache each week, left-sided lateralized severe pounding headache with associated light noise sensitivity, nauseous, she is now taking Maxalt 10 mg, sometimes Fioricet as needed, this will usually take care of the headache within one hour.   she is planning on to have bariatric surgery soon. have bariatric evaluation Jul 28 2016.   UPDATE October 29 2016:   Lumbar puncture in March 2018 showed open pressure of 23 cm water, Laboratory evaluation showed normal BMP, INR, CBC, negative HIV,   She tolerated Zonegran well, if she is compliance with it, her migraine would be under better control.  She is planning on to have bariatric surgery. Imitrex injection as needed works well for her, she used average 6 injection every month.    She went to nutritional consultation, has lost 8 pounds over past 2 weeks,   UPDATE Feb 15 2019:  She stopped the Zonegran shortly after her weight loss surgery in September 2018, she lost more than 50 pounds,   Only recently she began to have increased migraine headaches again, couple times each week, starting at the neck, spreading forward become right retro-orbital area headache with associated light noise sensitivity, nauseous, lasting for few hours, Maxalt dissolvable provide limited help, she rely on Imitrex injection, which helps but not 100%, she also noticed significant burning pain with each injection, is very hesitant to inject   She denies visual change, weight has been  stable, was followed by optometrist every year, there was no new concerns raised   With previous frequent headaches, pseudotumor cerebri, she was treated with Topamax, complains of unbearable side effect, numbness tingling, change of the taste, Diamox, also complains of discomfort in her tongue, feel acid all the time   She is also dealing with depression, fibromyalgia, with recent change to Lexapro   She was started on Zonegran since August 2020, with some improvement of her headaches, but she still have couple headaches each week,   UPDATE Feb 14 2021: She lost to follow-up since 2020, has all of her teeth pulled out in 2021, now has full denture, implant, her headache overall has improved, but still has mild major migraine once a week, lateralized severe headache, light and noise sensitivity, nauseous, worsened by movement.   She went to [facility] urgent care regularly to treat her headache, rely on frequent over-the-counter medication, she has run  out of her abortive, and preventive medication, previously was on aimovig 70 mg works well, also on Zomig,   Update August 22, 2021 SS: Never started back on the Aimovig, claims $700 a month even with insurance. Has a poor plan. Having 2-3 migraines a week, hard to get completely rid of. Takes Maxalt, ibuprofen/Tylenol. Saw Dr. [place] within the last year, no reports of papilledema. Started Doxepin, last month, has gained 30 lbs. Migraines are frontal, doesn't feel like IIH.  Update January 15, 2022 SS: Weight is up 20 lbs since last visit. Once a month will have migraine, last 2-3 days. Will take Relpax, it helps to dull it, but doesn't completely relieve, Imitrex injection works best but doesn't like it. Signing up for new insurance, Aimovig worked well but too expensive. Back on Zonegran taking 100 mg at bedtime. Has not been back to eye doctor. 5 years ago had gastric sleeve, considering newer injectable medications for weight loss. No change in vision,  denies any IIH symptoms.   Update November 18, 2022 SS:  Patient was in the ER 11/08/22 for migraine headache, given migraine cocktail with good improvement.     Remains on Zonegran 100 mg at bedtime, Amerge provided brief relief, would return. Having 3-4 migraines a month, can last 1-2 days. Summer is worse. I sent Nurtec, she didn't pick it up yet. Has not seen her eye doctor. Weight is up 13 lbs, not a candidate for injectable medications, A1C is perfect. Migraine aura of yawning, usually right sided occipital, spreads forward, behind right eye, has nausea, sensitive to sound. Really can't distinguish IIH vs migraine headaches, IIH was so long ago.   REVIEW OF SYSTEMS: Out of a complete 14 system review of symptoms, the patient complains only of the following symptoms, and all other reviewed systems are negative.  See HPI  ALLERGIES: Allergies  Allergen Reactions   Aspirin Other (See Comments)    Stomach pain Severe abdominal pain   Duloxetine Hcl Other (See Comments)    Other reaction(s): Edema   Lorazepam Other (See Comments)    "Can't remember anything while on it" aggressive    Naproxen Nausea And Vomiting and Other (See Comments)    cramping   Penicillins Hives    Has patient had a PCN reaction causing immediate rash, facial/tongue/throat swelling, SOB or lightheadedness with hypotension: No Has patient had a PCN reaction causing severe rash involving mucus membranes or skin necrosis: No Has patient had a PCN reaction that required hospitalization No Has patient had a PCN reaction occurring within the last 10 years: No If all of the above answers are "NO", then may proceed with Cephalosporin use.   Cephalexin    Duloxetine Other (See Comments)    blisters   Buspar [Buspirone] Other (See Comments)    "felt weird, heart raced, cloudy"   Macrobid [Nitrofurantoin Monohyd Macro] Other (See Comments)    Stomach pain   Sudafed [Pseudoephedrine Hcl] Hives   Topiramate Other (See  Comments)    Numbness/tingling in mouth/feet    HOME MEDICATIONS: Outpatient Medications Prior to Visit  Medication Sig Dispense Refill   ALPRAZolam (XANAX XR) 0.5 MG 24 hr tablet Take 1 tablet by mouth daily.     cloNIDine (CATAPRES) 0.1 MG tablet Take 1 tablet (0.1 mg total) by mouth 3 (three) times daily. (Patient taking differently: Take 0.2 mg by mouth 3 (three) times daily.) 90 tablet 0   diphenhydrAMINE (BENADRYL) 25 MG tablet Take 50 mg by mouth daily as  needed for allergies.     furosemide (LASIX) 20 MG tablet Take 10-20 mg by mouth daily as needed.     hydrochlorothiazide (MICROZIDE) 12.5 MG capsule Take 12.5 mg by mouth as needed.     naratriptan (AMERGE) 2.5 MG tablet Take 1 tablet (2.5 mg total) by mouth as needed for migraine. Take one (1) tablet at onset of headache; if returns or does not resolve, may repeat after 4 hours; do not exceed five (5) mg in 24 hours. 10 tablet 5   ondansetron (ZOFRAN-ODT) 8 MG disintegrating tablet 8mg  ODT q4 hours prn nausea 10 tablet 0   propranolol ER (INDERAL LA) 120 MG 24 hr capsule Take 120 mg by mouth daily.     sertraline (ZOLOFT) 100 MG tablet Take 1 tablet by mouth daily.     Vitamin D, Ergocalciferol, (DRISDOL) 1.25 MG (50000 UNIT) CAPS capsule Take 50,000 Units by mouth 2 (two) times a week.     promethazine (PHENERGAN) 25 MG tablet Take 1 tablet (25 mg total) by mouth every 6 (six) hours as needed for nausea or vomiting. 20 tablet 1   Rimegepant Sulfate (NURTEC) 75 MG TBDP Take 1 tablet (75 mg total) by mouth as needed. Take 1 tablet at onset of headache, max is 1 tablet in 24 hours. 8 tablet 11   tiZANidine (ZANAFLEX) 4 MG tablet Take 1 tablet (4 mg total) by mouth every 6 (six) hours as needed for muscle spasms. 30 tablet 6   zonisamide (ZONEGRAN) 100 MG capsule Take 1 capsule (100 mg total) by mouth at bedtime. 90 capsule 3   propranolol ER (INDERAL LA) 60 MG 24 hr capsule Take 1 capsule by mouth daily.     No facility-administered  medications prior to visit.    PAST MEDICAL HISTORY: Past Medical History:  Diagnosis Date   ADHD (attention deficit hyperactivity disorder)    ADHD, adult residual type    Anxiety    Depression    Family history of adverse reaction to anesthesia    sister has PONV   GERD (gastroesophageal reflux disease)    Headache    otc med prn - last one 2 months ago   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   Pseudotumor cerebri     PAST SURGICAL HISTORY: Past Surgical History:  Procedure Laterality Date   ABDOMINAL HYSTERECTOMY     CESAREAN SECTION     x 2   CYSTOSCOPY N/A 06/19/2015   Procedure: CYSTOSCOPY;  Surgeon: Jaymes Graff, MD;  Location: [facility];  Service: Gynecology;  Laterality: N/A;   GASTRECTOMY     sleeve, [facility]   IR GENERIC HISTORICAL  05/12/2016   IR FLUORO GUIDED NEEDLE PLC ASPIRATION/INJECTION LOC 05/12/2016 Paulina Fusi, MD [facility]   LAPAROSCOPIC VAGINAL HYSTERECTOMY WITH SALPINGECTOMY Bilateral 06/19/2015   Procedure: LAPAROSCOPIC ASSISTED VAGINAL HYSTERECTOMY WITH SALPINGECTOMY;  Surgeon: Jaymes Graff, MD;  Location: [facility];  Service: Gynecology;  Laterality: Bilateral;   lumbar pucture     x2   SACROILIAC JOINT FUSION Left 10/28/2017   Procedure: SACROILIAC JOINT FUSION;  Surgeon: Venita Lick, MD;  Location: [facility] OR;  Service: Orthopedics;  Laterality: Left;  90 mins   TONSILLECTOMY      FAMILY HISTORY: Family History  Problem Relation Age of Onset   Pneumonia Mother        Sepsis   Heart attack Father     SOCIAL HISTORY: Social History   Socioeconomic History   Marital status: Married    Spouse name:  Not on file   Number of children: 2   Years of education: cosmetology   Highest education level: Not on file  Occupational History   Occupation: Homemaker  Tobacco Use   Smoking status: Never   Smokeless tobacco: Never  Substance and Sexual Activity   Alcohol use: Yes    Comment: occasional liquor   Drug use: No   Sexual activity:  Yes    Birth control/protection: None    Comment: Husband had vasectomy  Other Topics Concern   Not on file  Social History Narrative   Lives at home with her husband and children.   Right-handed.   3-4 cups caffeine per day.   Social Determinants of Health   Financial Resource Strain: Low Risk  (06/05/2022)   Received from [facility], [facility]   Overall Financial Resource Strain (CARDIA)    Difficulty of Paying Living Expenses: Not hard at all  Food Insecurity: No Food Insecurity (06/05/2022)   Received from [facility], [facility]   Hunger Vital Sign    Worried About Running Out of Food in the Last Year: Never true    Ran Out of Food in the Last Year: Never true  Transportation Needs: No Transportation Needs (06/05/2022)   Received from [facility], [facility]   PRAPARE - Transportation    Lack of Transportation (Medical): No    Lack of Transportation (Non-Medical): No  Physical Activity: Not on file  Stress: Not on file  Social Connections: Unknown (07/01/2021)   Received from [facility], [facility]   Social Network    Social Network: Not on file  Intimate Partner Violence: Unknown (06/04/2021)   Received from [facility], [facility]   HITS    Physically Hurt: Not on file    Insult or Talk Down To: Not on file    Threaten Physical Harm: Not on file    Scream or Curse: Not on file   PHYSICAL EXAM  Vitals:   11/18/22 0803  BP: 107/74  Pulse: (!) 105  Weight: 257 lb (116.6 kg)  Height: 5\' 4"  (1.626 m)   Body mass index is 44.11 kg/m.  Generalized: Well developed, in no acute distress  Neurological examination  Mentation: Alert oriented to time, place, history taking. Follows all commands speech and language fluent Cranial nerve II-XII: Pupils were equal round reactive to light. Extraocular movements were full, visual field were full on confrontational test. Facial sensation and strength were normal. Head turning and shoulder shrug  were normal  and symmetric. Motor: The motor testing reveals 5 over 5 strength of all 4 extremities. Good symmetric motor tone is noted throughout.  Sensory: Sensory testing is intact to soft touch on all 4 extremities. No evidence of extinction is noted.  Coordination: Cerebellar testing reveals good finger-nose-finger and heel-to-shin bilaterally.  Gait and station: Gait is normal.   Reflexes: Deep tendon reflexes are symmetric and normal bilaterally.   DIAGNOSTIC DATA (LABS, IMAGING, TESTING) - I reviewed patient records, labs, notes, testing and imaging myself where available.  Lab Results  Component Value Date   WBC 18.7 (H) 09/16/2022   HGB 15.4 (H) 09/16/2022   HCT 45.2 09/16/2022   MCV 92.8 09/16/2022   PLT 519 (H) 09/16/2022      Component Value Date/Time   NA 135 09/16/2022 0402   K 4.5 09/16/2022 0402   CL 101 09/16/2022 0402   CO2 21 (L) 09/16/2022 0402   GLUCOSE 134 (H) 09/16/2022 0402   BUN 14  09/16/2022 0402   CREATININE 0.80 09/16/2022 0402   CALCIUM 10.0 09/16/2022 0402   PROT 7.6 12/11/2010 2035   ALBUMIN 4.0 12/11/2010 2035   AST 21 12/11/2010 2035   ALT 22 12/11/2010 2035   ALKPHOS 86 12/11/2010 2035   BILITOT 0.2 (L) 12/11/2010 2035   GFRNONAA >60 09/16/2022 0402   GFRAA >60 02/18/2018 2006   No results found for: "CHOL", "HDL", "LDLCALC", "LDLDIRECT", "TRIG", "CHOLHDL" No results found for: "HGBA1C" No results found for: "VITAMINB12" No results found for: "TSH"  Margie Ege, AGNP-C, DNP 11/18/2022, 8:59 AM [facility] [address] [place], [place] [address] [phone]

## 2022-11-18 ENCOUNTER — Ambulatory Visit: Payer: Commercial Managed Care - HMO | Admitting: Neurology

## 2022-11-18 ENCOUNTER — Encounter: Payer: Self-pay | Admitting: Neurology

## 2022-11-18 VITALS — BP 107/74 | HR 105 | Ht 64.0 in | Wt 257.0 lb

## 2022-11-18 DIAGNOSIS — G43709 Chronic migraine without aura, not intractable, without status migrainosus: Secondary | ICD-10-CM | POA: Diagnosis not present

## 2022-11-18 DIAGNOSIS — G932 Benign intracranial hypertension: Secondary | ICD-10-CM | POA: Diagnosis not present

## 2022-11-18 MED ORDER — TIZANIDINE HCL 4 MG PO TABS: 4.0000 mg | ORAL_TABLET | Freq: Four times a day (QID) | ORAL | 1 refills | Status: DC | PRN

## 2022-11-18 MED ORDER — ZONISAMIDE 100 MG PO CAPS: 100.0000 mg | ORAL_CAPSULE | Freq: Every day | ORAL | 3 refills | Status: DC

## 2022-11-18 MED ORDER — PROMETHAZINE HCL 25 MG PO TABS: 25.0000 mg | ORAL_TABLET | Freq: Four times a day (QID) | ORAL | 1 refills | Status: DC | PRN

## 2022-11-18 MED ORDER — NURTEC 75 MG PO TBDP: 75.0000 mg | ORAL_TABLET | ORAL | 11 refills | Status: DC | PRN

## 2022-11-18 MED ORDER — EMGALITY 120 MG/ML ~~LOC~~ SOAJ: 240.0000 mg | SUBCUTANEOUS | 0 refills | Status: DC

## 2022-11-18 MED ORDER — EMGALITY 120 MG/ML ~~LOC~~ SOAJ: 120.0000 mg | SUBCUTANEOUS | 11 refills | Status: DC

## 2022-11-18 NOTE — Patient Instructions (Addendum)
Please see your eye doctor for eye exam with history of IIH Add on Emgality, start with 2 pens for loading dose, followed by 1 pen monthly for migraine prevention.  Try Nurtec for acute migraine, may combine with Aleve, tizanidine for severe migraine Work on weight loss, is key to longterm management of IIH Continue the Zonegran  See you back in 6 months

## 2022-12-08 ENCOUNTER — Other Ambulatory Visit (HOSPITAL_COMMUNITY): Payer: Self-pay

## 2022-12-23 ENCOUNTER — Emergency Department (HOSPITAL_COMMUNITY): Payer: Commercial Managed Care - HMO

## 2022-12-23 ENCOUNTER — Emergency Department (HOSPITAL_COMMUNITY)
Admission: EM | Admit: 2022-12-23 | Discharge: 2022-12-23 | Disposition: A | Discharge status: Home / Self Care | Payer: Commercial Managed Care - HMO | Attending: Emergency Medicine | Admitting: Emergency Medicine

## 2022-12-23 ENCOUNTER — Other Ambulatory Visit: Payer: Self-pay

## 2022-12-23 ENCOUNTER — Encounter (HOSPITAL_COMMUNITY): Payer: Self-pay

## 2022-12-23 DIAGNOSIS — W0110XA Fall on same level from slipping, tripping and stumbling with subsequent striking against unspecified object, initial encounter: Secondary | ICD-10-CM | POA: Insufficient documentation

## 2022-12-23 DIAGNOSIS — S43035A Inferior dislocation of left humerus, initial encounter: Secondary | ICD-10-CM

## 2022-12-23 DIAGNOSIS — S43142A Inferior dislocation of left acromioclavicular joint, initial encounter: Secondary | ICD-10-CM | POA: Diagnosis not present

## 2022-12-23 DIAGNOSIS — S4992XA Unspecified injury of left shoulder and upper arm, initial encounter: Secondary | ICD-10-CM | POA: Diagnosis present

## 2022-12-23 MED ORDER — KETOROLAC TROMETHAMINE 10 MG PO TABS: 10.0000 mg | ORAL_TABLET | Freq: Four times a day (QID) | ORAL | 0 refills | Status: DC | PRN

## 2022-12-23 MED ORDER — FENTANYL CITRATE PF 50 MCG/ML IJ SOSY
PREFILLED_SYRINGE | INTRAMUSCULAR | Status: AC
  Administered 2022-12-23: 50 ug
  Filled 2022-12-23: qty 1

## 2022-12-23 MED ORDER — KETOROLAC TROMETHAMINE 15 MG/ML IJ SOLN
15.0000 mg | Freq: Once | INTRAMUSCULAR | Status: AC
  Administered 2022-12-23: 15 mg via INTRAVENOUS
  Filled 2022-12-23: qty 1

## 2022-12-23 MED ORDER — ONDANSETRON HCL 4 MG/2ML IJ SOLN
4.0000 mg | Freq: Once | INTRAMUSCULAR | Status: AC
  Administered 2022-12-23: 4 mg via INTRAVENOUS
  Filled 2022-12-23: qty 2

## 2022-12-23 MED ORDER — FENTANYL CITRATE PF 50 MCG/ML IJ SOSY
25.0000 ug | PREFILLED_SYRINGE | Freq: Once | INTRAMUSCULAR | Status: AC
  Administered 2022-12-23: 25 ug via INTRAVENOUS
  Filled 2022-12-23: qty 1

## 2022-12-23 MED ORDER — PROPOFOL 10 MG/ML IV BOLUS
0.5000 mg/kg | Freq: Once | INTRAVENOUS | Status: AC
  Administered 2022-12-23: 140 mg via INTRAVENOUS

## 2022-12-23 MED ORDER — FENTANYL CITRATE PF 50 MCG/ML IJ SOSY
50.0000 ug | PREFILLED_SYRINGE | Freq: Once | INTRAMUSCULAR | Status: AC
  Administered 2022-12-23: 50 ug via INTRAVENOUS
  Filled 2022-12-23: qty 1

## 2022-12-23 MED ORDER — PROPOFOL 10 MG/ML IV BOLUS
INTRAVENOUS | Status: AC
  Filled 2022-12-23: qty 20

## 2022-12-23 NOTE — Discharge Instructions (Addendum)
You presented clinically with an inferior shoulder dislocation which was reduced under procedural sedation. You have been placed in a sling for comfort. Recommend rest, ice for the next 24 hours, Toradol for pain control. Maintain your sling for the next week. Follow-up outpatient with orthopedics.

## 2022-12-23 NOTE — ED Provider Notes (Signed)
[place] EMERGENCY DEPARTMENT AT [facility] Provider Note   CSN: [id number] Arrival date & time: 12/23/22  0358     History  Chief Complaint  Patient presents with   Shoulder Injury    [name] is a 42 y.o. female.   Shoulder Injury     42 year old female with medical history significant for anxiety, depression, GERD, ADHD presenting to the emergency department with a chief complaint of left shoulder injury.  The patient states that she got up and tripped sustaining a mechanical ground-level fall tripping on a rug.  She has been unable to range her left shoulder and has been stuck in an abducted position her left arm stuck above her head.  She has very limited range of motion of the shoulder.  She received 150 mcg of intranasal Narcan en route with EMS.  She denies any other injuries or complaints, no head trauma, loss of consciousness, no use of anticoagulation.  She arrives GCS 15, ABC intact.  Home Medications Prior to Admission medications   Medication Sig Start Date End Date Taking? Authorizing Provider  ketorolac (TORADOL) 10 MG tablet Take 1 tablet (10 mg total) by mouth every 6 (six) hours as needed. 12/23/22  Yes Ernie Avena, MD  ALPRAZolam (XANAX XR) 0.5 MG 24 hr tablet Take 1 tablet by mouth daily. 04/22/19   [provider]  cloNIDine (CATAPRES) 0.1 MG tablet Take 1 tablet (0.1 mg total) by mouth 3 (three) times daily. Patient taking differently: Take 0.2 mg by mouth 3 (three) times daily. 09/16/22   Geoffery Lyons, MD  diphenhydrAMINE (BENADRYL) 25 MG tablet Take 50 mg by mouth daily as needed for allergies.    [provider]  furosemide (LASIX) 20 MG tablet Take 10-20 mg by mouth daily as needed. 05/12/21   [provider]  Galcanezumab-gnlm (EMGALITY) 120 MG/ML SOAJ Inject 240 mg into the skin every 30 (thirty) days. 11/18/22   Glean Salvo, NP  Galcanezumab-gnlm (EMGALITY) 120 MG/ML SOAJ Inject 120 mg into the skin  every 30 (thirty) days. 11/18/22   Glean Salvo, NP  hydrochlorothiazide (MICROZIDE) 12.5 MG capsule Take 12.5 mg by mouth as needed. 09/20/18   [provider]  naratriptan (AMERGE) 2.5 MG tablet Take 1 tablet (2.5 mg total) by mouth as needed for migraine. Take one (1) tablet at onset of headache; if returns or does not resolve, may repeat after 4 hours; do not exceed five (5) mg in 24 hours. 01/15/22   Glean Salvo, NP  ondansetron (ZOFRAN-ODT) 8 MG disintegrating tablet 8mg  ODT q4 hours prn nausea 09/16/22   Geoffery Lyons, MD  promethazine (PHENERGAN) 25 MG tablet Take 1 tablet (25 mg total) by mouth every 6 (six) hours as needed for nausea or vomiting. 11/18/22   Glean Salvo, NP  propranolol ER (INDERAL LA) 120 MG 24 hr capsule Take 120 mg by mouth daily.    [provider]  Rimegepant Sulfate (NURTEC) 75 MG TBDP Take 1 tablet (75 mg total) by mouth as needed. Take 1 tablet at onset of headache, max is 1 tablet in 24 hours. 11/18/22   Glean Salvo, NP  sertraline (ZOLOFT) 100 MG tablet Take 1 tablet by mouth daily. 11/26/20   [provider]  tiZANidine (ZANAFLEX) 4 MG tablet Take 1 tablet (4 mg total) by mouth every 6 (six) hours as needed for muscle spasms. 11/18/22   Glean Salvo, NP  Vitamin D, Ergocalciferol, (DRISDOL) 1.25 MG (50000  UNIT) CAPS capsule Take 50,000 Units by mouth 2 (two) times a week.    [provider]  zonisamide (ZONEGRAN) 100 MG capsule Take 1 capsule (100 mg total) by mouth at bedtime. 11/18/22   Glean Salvo, NP      Allergies    Aspirin, Duloxetine hcl, Lorazepam, Naproxen, Penicillins, Cephalexin, Duloxetine, Buspar [buspirone], Macrobid [nitrofurantoin monohyd macro], Sudafed [pseudoephedrine hcl], and Topiramate    Review of Systems   Review of Systems  All other systems reviewed and are negative.   Physical Exam Updated Vital Signs BP (!) 159/86   Pulse 78   Temp 98.2 F (36.8 C) (Oral)   Resp 11   LMP  06/12/2015 Comment: negative pregnancy test result today.  SpO2 100%  Physical Exam Vitals and nursing note reviewed.  Constitutional:      General: She is not in acute distress. HENT:     Head: Normocephalic and atraumatic.  Eyes:     Conjunctiva/sclera: Conjunctivae normal.     Pupils: Pupils are equal, round, and reactive to light.  Cardiovascular:     Rate and Rhythm: Normal rate and regular rhythm.  Pulmonary:     Effort: Pulmonary effort is normal. No respiratory distress.  Abdominal:     General: There is no distension.     Tenderness: There is no guarding.  Musculoskeletal:        General: No deformity or signs of injury.     Cervical back: Neck supple.     Comments: Left shoulder abducted, rotated above the head, neurovascular intact with intact motor function along the median, ulnar, radial nerve distributions, 1+ radial pulses.  Pain in the shoulder on any attempts at range of motion.  Skin:    Findings: No lesion or rash.  Neurological:     General: No focal deficit present.     Mental Status: She is alert. Mental status is at baseline.     ED Results / Procedures / Treatments   Labs (all labs ordered are listed, but only abnormal results are displayed) Labs Reviewed - No data to display  EKG None  Radiology No results found.  Procedures .Ortho Injury Treatment  Date/Time: 12/23/2022 5:31 AM  Performed by: Ernie Avena, MD Authorized by: Ernie Avena, MD   Consent:    Consent obtained:  Written   Consent given by:  Patient   Risks discussed:  Irreducible dislocation, recurrent dislocation and nerve damageInjury location: shoulder Location details: left shoulder Injury type: dislocation Dislocation type: inferior Hill-Sachs deformity: no Chronicity: new Pre-procedure neurovascular assessment: neurovascularly intact Pre-procedure distal perfusion: normal Pre-procedure neurological function: normal Pre-procedure range of motion:  reduced  Anesthesia: Local anesthesia used: no  Patient sedated: Yes. Refer to sedation procedure documentation for details of sedation. Manipulation performed: yes Reduction method: traction and counter traction Reduction successful: yes X-ray confirmed reduction: yes Immobilization: sling Splint Applied by: ED Nurse Post-procedure neurovascular assessment: post-procedure neurovascularly intact   .Sedation  Date/Time: 12/23/2022 5:32 AM  Performed by: Ernie Avena, MD Authorized by: Ernie Avena, MD   Consent:    Consent obtained:  Written   Consent given by:  Patient   Risks discussed:  Allergic reaction, dysrhythmia, inadequate sedation, respiratory compromise necessitating ventilatory assistance and intubation, vomiting, nausea and prolonged hypoxia resulting in organ damage Universal protocol:    Immediately prior to procedure, a time out was called: yes     Patient identity confirmed:  Arm band and verbally with patient Indications:    Procedure performed:  Dislocation reduction   Procedure necessitating sedation performed by:  Physician performing sedation Pre-sedation assessment:    Time since last food or drink:  8 hours   ASA classification: class 1 - normal, healthy patient     Mouth opening:  3 or more finger widths   Thyromental distance:  3 finger widths   Mallampati score:  I - soft palate, uvula, fauces, pillars visible   Neck mobility: normal     Pre-sedation assessments completed and reviewed: airway patency, cardiovascular function, hydration status, mental status, nausea/vomiting, pain level, respiratory function and temperature     Pre-sedation assessment completed:  12/23/2022 5:15 AM Immediate pre-procedure details:    Reassessment: Patient reassessed immediately prior to procedure     Reviewed: vital signs, relevant labs/tests and NPO status     Verified: bag valve mask available, emergency equipment available, intubation equipment available, IV  patency confirmed, oxygen available, reversal medications available and suction available   Procedure details (see MAR for exact dosages):    Preoxygenation:  Nasal cannula   Sedation:  Propofol   Intended level of sedation: deep   Analgesia:  Fentanyl   Intra-procedure monitoring:  Blood pressure monitoring, cardiac monitor, continuous capnometry, continuous pulse oximetry, frequent LOC assessments and frequent vital sign checks   Intra-procedure events: hypoxia and respiratory depression     Intra-procedure management:  BVM ventilation   Total Provider sedation time (minutes):  10 Post-procedure details:    Post-sedation assessment completed:  12/23/2022 5:33 AM   Attendance: Constant attendance by certified staff until patient recovered     Recovery: Patient returned to pre-procedure baseline     Post-sedation assessments completed and reviewed: airway patency, cardiovascular function, hydration status, mental status, nausea/vomiting, pain level and respiratory function     Procedure completion:  Tolerated well, no immediate complications Comments:     Status post reduction, the patient had a brief period of apnea requiring BVM resuscitation for 30 seconds.  O2 sats did drop to 78% however this was at the same time the blood pressure cuff was going off.  With BVM resuscitation, the patient's sats immediately improved to 100%.     Medications Ordered in ED Medications  ketorolac (TORADOL) 15 MG/ML injection 15 mg (has no administration in time range)  fentaNYL (SUBLIMAZE) injection 50 mcg (50 mcg Intravenous Given 12/23/22 0451)  propofol (DIPRIVAN) 10 mg/mL bolus/IV push 0.5 mg/kg ( Intravenous Not Given 12/23/22 0543)  ondansetron (ZOFRAN) injection 4 mg (4 mg Intravenous Given 12/23/22 0455)  fentaNYL (SUBLIMAZE) 50 MCG/ML injection (50 mcg  Given 12/23/22 0515)  fentaNYL (SUBLIMAZE) injection 25 mcg (25 mcg Intravenous Given 12/23/22 1610)    ED Course/ Medical Decision Making/  A&P                                 Medical Decision Making Amount and/or Complexity of Data Reviewed Radiology: ordered.  Risk Prescription drug management.    42 year old female with medical history significant for anxiety, depression, GERD, ADHD presenting to the emergency department with a chief complaint of left shoulder injury.  The patient states that she got up and tripped sustaining a mechanical ground-level fall tripping on a rug.  She has been unable to range her left shoulder and has been stuck in an abducted position her left arm stuck above her head.  She has very limited range of motion of the shoulder.  She received 150 mcg of intranasal Narcan  en route with EMS.  She denies any other injuries or complaints, no head trauma, loss of consciousness, no use of anticoagulation.  She arrives GCS 15, ABC intact.  On arrival, the patient was vitally stable however in acute distress due to pain from a likely inferior shoulder dislocation.  Physical exam was concerning for a left shoulder that was abducted, rotated above the head with minimal range of motion, neurovascularly intact.  Concern for inferior shoulder dislocation.  X-ray imaging was performed On my read is consistent with an inferior shoulder dislocation.  The patient was consented for procedural sedation for reduction of her inferior shoulder dislocation.  She was placed on end-tidal capnography monitoring and administered an IV fluid bolus, IV Zofran and additional fentanyl for pain control.  Procedural sedation was then undertaken per the note above with successful reduction with a clear clunk.  Sedation was complicated by brief period of apnea which resolved with bag-valve-mask resuscitation for 30 seconds.  Patient was placed in a shoulder sling and postreduction films were obtained.  XR Shoulder L: Appears to be located on post reduction AP film. Final radiology read pending at signout. Plan for DC, rest, ice, oral Toradol  for pain control, outpatient Ortho follow-up. Signout given to Dr. Freida Busman at [phone].   Final Clinical Impression(s) / ED Diagnoses Final diagnoses:  Inferior dislocation of left shoulder, initial encounter    Rx / DC Orders ED Discharge Orders          Ordered    AMB referral to orthopedics        12/23/22 0738    ketorolac (TORADOL) 10 MG tablet  Every 6 hours PRN        12/23/22 0739              Ernie Avena, MD 12/23/22 0740

## 2022-12-23 NOTE — Progress Notes (Addendum)
This writer was called to pt bedside for a conscious sedation procedure.  Upon arrival, approximately 0510, pt found already on etco2 monitor.  Pt placed on 2lpm via etco2 / [place].  HR82, rr22, spo2 100%.   Suction and BMV both on and setup at hob.  After last dose of medicine was given, pt did exhibit some shallow / minimal respirations for less than one minute and was bagged on 15L O2 during that time.  Pt quickly recovered and is now awake and talking.  Pt remains on 2l, spo2 100%

## 2022-12-23 NOTE — ED Notes (Addendum)
Procedure completed - shoulder appears to be reduced

## 2022-12-23 NOTE — ED Triage Notes (Signed)
Pt to ED by EMS from home with c/o L shoulder injury. Pt tripped on a rug and landed on her shoulder. Pt has very limited ROM and is unable to lower that arm. Pt is tearful in triage. Received IN fentanyl by EMS. Arrives A+O, VSS.

## 2022-12-23 NOTE — ED Notes (Signed)
Time out noted

## 2022-12-30 ENCOUNTER — Inpatient Hospital Stay (HOSPITAL_COMMUNITY)
Admission: EM | Admit: 2022-12-30 | Discharge: 2023-01-02 | DRG: 071 | Disposition: A | Discharge status: Home / Self Care | Payer: Commercial Managed Care - HMO | Attending: Internal Medicine | Admitting: Internal Medicine

## 2022-12-30 ENCOUNTER — Encounter (HOSPITAL_COMMUNITY): Payer: Self-pay

## 2022-12-30 ENCOUNTER — Other Ambulatory Visit: Payer: Self-pay

## 2022-12-30 DIAGNOSIS — I6783 Posterior reversible encephalopathy syndrome: Secondary | ICD-10-CM | POA: Diagnosis not present

## 2022-12-30 DIAGNOSIS — D75839 Thrombocytosis, unspecified: Secondary | ICD-10-CM | POA: Diagnosis present

## 2022-12-30 DIAGNOSIS — D72829 Elevated white blood cell count, unspecified: Secondary | ICD-10-CM | POA: Diagnosis present

## 2022-12-30 DIAGNOSIS — E876 Hypokalemia: Secondary | ICD-10-CM | POA: Diagnosis present

## 2022-12-30 DIAGNOSIS — W010XXD Fall on same level from slipping, tripping and stumbling without subsequent striking against object, subsequent encounter: Secondary | ICD-10-CM | POA: Diagnosis present

## 2022-12-30 DIAGNOSIS — I5032 Chronic diastolic (congestive) heart failure: Secondary | ICD-10-CM | POA: Diagnosis present

## 2022-12-30 DIAGNOSIS — I110 Hypertensive heart disease with heart failure: Secondary | ICD-10-CM | POA: Diagnosis present

## 2022-12-30 DIAGNOSIS — F909 Attention-deficit hyperactivity disorder, unspecified type: Secondary | ICD-10-CM | POA: Diagnosis present

## 2022-12-30 DIAGNOSIS — D751 Secondary polycythemia: Secondary | ICD-10-CM | POA: Diagnosis present

## 2022-12-30 DIAGNOSIS — K219 Gastro-esophageal reflux disease without esophagitis: Secondary | ICD-10-CM | POA: Diagnosis present

## 2022-12-30 DIAGNOSIS — I959 Hypotension, unspecified: Secondary | ICD-10-CM | POA: Diagnosis present

## 2022-12-30 DIAGNOSIS — Z880 Allergy status to penicillin: Secondary | ICD-10-CM

## 2022-12-30 DIAGNOSIS — Z8739 Personal history of other diseases of the musculoskeletal system and connective tissue: Secondary | ICD-10-CM

## 2022-12-30 DIAGNOSIS — Z79899 Other long term (current) drug therapy: Secondary | ICD-10-CM

## 2022-12-30 DIAGNOSIS — Z9884 Bariatric surgery status: Secondary | ICD-10-CM

## 2022-12-30 DIAGNOSIS — G8929 Other chronic pain: Secondary | ICD-10-CM | POA: Diagnosis present

## 2022-12-30 DIAGNOSIS — N946 Dysmenorrhea, unspecified: Secondary | ICD-10-CM | POA: Insufficient documentation

## 2022-12-30 DIAGNOSIS — R739 Hyperglycemia, unspecified: Secondary | ICD-10-CM | POA: Diagnosis present

## 2022-12-30 DIAGNOSIS — R519 Headache, unspecified: Secondary | ICD-10-CM | POA: Diagnosis not present

## 2022-12-30 DIAGNOSIS — Z886 Allergy status to analgesic agent status: Secondary | ICD-10-CM

## 2022-12-30 DIAGNOSIS — F419 Anxiety disorder, unspecified: Secondary | ICD-10-CM | POA: Diagnosis present

## 2022-12-30 DIAGNOSIS — Z981 Arthrodesis status: Secondary | ICD-10-CM

## 2022-12-30 DIAGNOSIS — F32A Depression, unspecified: Secondary | ICD-10-CM | POA: Diagnosis present

## 2022-12-30 DIAGNOSIS — S43015D Anterior dislocation of left humerus, subsequent encounter: Secondary | ICD-10-CM

## 2022-12-30 DIAGNOSIS — R9431 Abnormal electrocardiogram [ECG] [EKG]: Secondary | ICD-10-CM | POA: Diagnosis present

## 2022-12-30 DIAGNOSIS — Z8249 Family history of ischemic heart disease and other diseases of the circulatory system: Secondary | ICD-10-CM

## 2022-12-30 DIAGNOSIS — Z6841 Body Mass Index (BMI) 40.0 and over, adult: Secondary | ICD-10-CM

## 2022-12-30 DIAGNOSIS — I674 Hypertensive encephalopathy: Secondary | ICD-10-CM | POA: Diagnosis present

## 2022-12-30 DIAGNOSIS — E66813 Obesity, class 3: Secondary | ICD-10-CM | POA: Diagnosis present

## 2022-12-30 DIAGNOSIS — I509 Heart failure, unspecified: Secondary | ICD-10-CM

## 2022-12-30 DIAGNOSIS — Z90710 Acquired absence of both cervix and uterus: Secondary | ICD-10-CM

## 2022-12-30 DIAGNOSIS — R7989 Other specified abnormal findings of blood chemistry: Secondary | ICD-10-CM | POA: Diagnosis present

## 2022-12-30 MED ORDER — CLONIDINE HCL 0.2 MG PO TABS
0.2000 mg | ORAL_TABLET | Freq: Once | ORAL | Status: AC
  Administered 2022-12-30: 0.2 mg via ORAL
  Filled 2022-12-30: qty 1

## 2022-12-30 NOTE — ED Triage Notes (Addendum)
Patient BIB [facility] from home with complaint of headache & high blood pressure, reports not taking night time BP meds yet today.  Was getting into shower when got dizzy, denies fall.   Patient reports taking home migraine medication with no relief of pain.   22g R FA  Patient reports that when she was going to take a shower she had an episode of confusion & reports having blurred vision.

## 2022-12-31 ENCOUNTER — Emergency Department (HOSPITAL_COMMUNITY): Payer: Commercial Managed Care - HMO

## 2022-12-31 ENCOUNTER — Encounter (HOSPITAL_COMMUNITY): Payer: Self-pay | Admitting: Internal Medicine

## 2022-12-31 ENCOUNTER — Inpatient Hospital Stay (HOSPITAL_COMMUNITY): Payer: Commercial Managed Care - HMO

## 2022-12-31 DIAGNOSIS — Z6841 Body Mass Index (BMI) 40.0 and over, adult: Secondary | ICD-10-CM | POA: Diagnosis not present

## 2022-12-31 DIAGNOSIS — Z8739 Personal history of other diseases of the musculoskeletal system and connective tissue: Secondary | ICD-10-CM | POA: Diagnosis not present

## 2022-12-31 DIAGNOSIS — D751 Secondary polycythemia: Secondary | ICD-10-CM | POA: Diagnosis present

## 2022-12-31 DIAGNOSIS — I509 Heart failure, unspecified: Secondary | ICD-10-CM | POA: Diagnosis not present

## 2022-12-31 DIAGNOSIS — Z8249 Family history of ischemic heart disease and other diseases of the circulatory system: Secondary | ICD-10-CM | POA: Diagnosis not present

## 2022-12-31 DIAGNOSIS — F419 Anxiety disorder, unspecified: Secondary | ICD-10-CM | POA: Diagnosis present

## 2022-12-31 DIAGNOSIS — I6783 Posterior reversible encephalopathy syndrome: Secondary | ICD-10-CM | POA: Diagnosis present

## 2022-12-31 DIAGNOSIS — R569 Unspecified convulsions: Secondary | ICD-10-CM

## 2022-12-31 DIAGNOSIS — I110 Hypertensive heart disease with heart failure: Secondary | ICD-10-CM | POA: Diagnosis present

## 2022-12-31 DIAGNOSIS — I674 Hypertensive encephalopathy: Secondary | ICD-10-CM | POA: Diagnosis present

## 2022-12-31 DIAGNOSIS — Z90710 Acquired absence of both cervix and uterus: Secondary | ICD-10-CM | POA: Diagnosis not present

## 2022-12-31 DIAGNOSIS — I959 Hypotension, unspecified: Secondary | ICD-10-CM | POA: Diagnosis not present

## 2022-12-31 DIAGNOSIS — I5032 Chronic diastolic (congestive) heart failure: Secondary | ICD-10-CM | POA: Diagnosis present

## 2022-12-31 DIAGNOSIS — R9431 Abnormal electrocardiogram [ECG] [EKG]: Secondary | ICD-10-CM | POA: Diagnosis present

## 2022-12-31 DIAGNOSIS — Z880 Allergy status to penicillin: Secondary | ICD-10-CM | POA: Diagnosis not present

## 2022-12-31 DIAGNOSIS — D75839 Thrombocytosis, unspecified: Secondary | ICD-10-CM | POA: Diagnosis present

## 2022-12-31 DIAGNOSIS — S43015D Anterior dislocation of left humerus, subsequent encounter: Secondary | ICD-10-CM | POA: Diagnosis not present

## 2022-12-31 DIAGNOSIS — Z9884 Bariatric surgery status: Secondary | ICD-10-CM | POA: Diagnosis not present

## 2022-12-31 DIAGNOSIS — Z886 Allergy status to analgesic agent status: Secondary | ICD-10-CM | POA: Diagnosis not present

## 2022-12-31 DIAGNOSIS — Z981 Arthrodesis status: Secondary | ICD-10-CM | POA: Diagnosis not present

## 2022-12-31 DIAGNOSIS — R7989 Other specified abnormal findings of blood chemistry: Secondary | ICD-10-CM | POA: Diagnosis present

## 2022-12-31 DIAGNOSIS — E876 Hypokalemia: Secondary | ICD-10-CM | POA: Diagnosis present

## 2022-12-31 DIAGNOSIS — D72829 Elevated white blood cell count, unspecified: Secondary | ICD-10-CM | POA: Diagnosis present

## 2022-12-31 DIAGNOSIS — F32A Depression, unspecified: Secondary | ICD-10-CM | POA: Diagnosis present

## 2022-12-31 DIAGNOSIS — R519 Headache, unspecified: Secondary | ICD-10-CM | POA: Diagnosis present

## 2022-12-31 DIAGNOSIS — I5031 Acute diastolic (congestive) heart failure: Secondary | ICD-10-CM | POA: Diagnosis not present

## 2022-12-31 DIAGNOSIS — W010XXD Fall on same level from slipping, tripping and stumbling without subsequent striking against object, subsequent encounter: Secondary | ICD-10-CM | POA: Diagnosis present

## 2022-12-31 DIAGNOSIS — Z79899 Other long term (current) drug therapy: Secondary | ICD-10-CM | POA: Diagnosis not present

## 2022-12-31 DIAGNOSIS — F909 Attention-deficit hyperactivity disorder, unspecified type: Secondary | ICD-10-CM | POA: Diagnosis present

## 2022-12-31 HISTORY — DX: Heart failure, unspecified: I50.9

## 2022-12-31 LAB — CBC WITH DIFFERENTIAL/PLATELET
Abs Immature Granulocytes: 0.05 10*3/uL (ref 0.00–0.07)
Basophils Absolute: 0 10*3/uL (ref 0.0–0.1)
Basophils Relative: 0 %
Eosinophils Absolute: 0 10*3/uL (ref 0.0–0.5)
Eosinophils Relative: 0 %
HCT: 37.9 % (ref 36.0–46.0)
Hemoglobin: 12.4 g/dL (ref 12.0–15.0)
Immature Granulocytes: 0 %
Lymphocytes Relative: 19 %
Lymphs Abs: 2.3 10*3/uL (ref 0.7–4.0)
MCH: 31.9 pg (ref 26.0–34.0)
MCHC: 32.7 g/dL (ref 30.0–36.0)
MCV: 97.4 fL (ref 80.0–100.0)
Monocytes Absolute: 0.8 10*3/uL (ref 0.1–1.0)
Monocytes Relative: 7 %
Neutro Abs: 8.8 10*3/uL — ABNORMAL HIGH (ref 1.7–7.7)
Neutrophils Relative %: 74 %
Platelets: 410 10*3/uL — ABNORMAL HIGH (ref 150–400)
RBC: 3.89 MIL/uL (ref 3.87–5.11)
RDW: 13.1 % (ref 11.5–15.5)
WBC: 11.9 10*3/uL — ABNORMAL HIGH (ref 4.0–10.5)
nRBC: 0 % (ref 0.0–0.2)

## 2022-12-31 LAB — COMPREHENSIVE METABOLIC PANEL
ALT: 41 U/L (ref 0–44)
AST: 28 U/L (ref 15–41)
Albumin: 3.7 g/dL (ref 3.5–5.0)
Alkaline Phosphatase: 98 U/L (ref 38–126)
Anion gap: 11 (ref 5–15)
BUN: 12 mg/dL (ref 6–20)
CO2: 21 mmol/L — ABNORMAL LOW (ref 22–32)
Calcium: 9.4 mg/dL (ref 8.9–10.3)
Chloride: 105 mmol/L (ref 98–111)
Creatinine, Ser: 0.94 mg/dL (ref 0.44–1.00)
GFR, Estimated: >60 mL/min (ref >60)
Glucose, Bld: 146 mg/dL — ABNORMAL HIGH (ref 70–99)
Potassium: 4.6 mmol/L (ref 3.5–5.1)
Sodium: 137 mmol/L (ref 135–145)
Total Bilirubin: 0.6 mg/dL (ref 0.3–1.2)
Total Protein: 7.3 g/dL (ref 6.5–8.1)

## 2022-12-31 LAB — RAPID URINE DRUG SCREEN, HOSP PERFORMED
Amphetamines: NOT DETECTED
Barbiturates: NOT DETECTED
Benzodiazepines: POSITIVE — AB
Cocaine: NOT DETECTED
Opiates: NOT DETECTED
Tetrahydrocannabinol: NOT DETECTED

## 2022-12-31 LAB — CBC
HCT: 45.5 % (ref 36.0–46.0)
Hemoglobin: 15.2 g/dL — ABNORMAL HIGH (ref 12.0–15.0)
MCH: 31.7 pg (ref 26.0–34.0)
MCHC: 33.4 g/dL (ref 30.0–36.0)
MCV: 94.8 fL (ref 80.0–100.0)
Platelets: 504 10*3/uL — ABNORMAL HIGH (ref 150–400)
RBC: 4.8 MIL/uL (ref 3.87–5.11)
RDW: 12.9 % (ref 11.5–15.5)
WBC: 15.8 10*3/uL — ABNORMAL HIGH (ref 4.0–10.5)
nRBC: 0 % (ref 0.0–0.2)

## 2022-12-31 LAB — TSH: TSH: 2.744 u[IU]/mL (ref 0.350–4.500)

## 2022-12-31 LAB — ACETAMINOPHEN LEVEL: Acetaminophen (Tylenol), Serum: <10 ug/mL — ABNORMAL LOW (ref 10–30)

## 2022-12-31 LAB — SALICYLATE LEVEL: Salicylate Lvl: <7 mg/dL — ABNORMAL LOW (ref 7.0–30.0)

## 2022-12-31 LAB — HEMOGLOBIN A1C
Hgb A1c MFr Bld: 5.4 % (ref 4.8–5.6)
Mean Plasma Glucose: 108.28 mg/dL

## 2022-12-31 LAB — HIV ANTIBODY (ROUTINE TESTING W REFLEX): HIV Screen 4th Generation wRfx: NONREACTIVE

## 2022-12-31 LAB — LIPASE, BLOOD: Lipase: 21 U/L (ref 11–51)

## 2022-12-31 LAB — HCG, SERUM, QUALITATIVE: Preg, Serum: NEGATIVE

## 2022-12-31 LAB — MAGNESIUM: Magnesium: 1.7 mg/dL (ref 1.7–2.4)

## 2022-12-31 LAB — TROPONIN I (HIGH SENSITIVITY)
Troponin I (High Sensitivity): 65 ng/L — ABNORMAL HIGH (ref <18)
Troponin I (High Sensitivity): 67 ng/L — ABNORMAL HIGH (ref <18)

## 2022-12-31 LAB — ETHANOL: Alcohol, Ethyl (B): <10 mg/dL (ref <10)

## 2022-12-31 LAB — PROCALCITONIN: Procalcitonin: <0.1 ng/mL

## 2022-12-31 LAB — BRAIN NATRIURETIC PEPTIDE: B Natriuretic Peptide: 1141.4 pg/mL — ABNORMAL HIGH (ref 0.0–100.0)

## 2022-12-31 MED ORDER — ACETAMINOPHEN 650 MG RE SUPP: 650.0000 mg | Freq: Four times a day (QID) | RECTAL | Status: DC | PRN

## 2022-12-31 MED ORDER — PROPRANOLOL HCL ER 120 MG PO CP24
120.0000 mg | ORAL_CAPSULE | Freq: Every day | ORAL | Status: DC
  Filled 2022-12-31: qty 1

## 2022-12-31 MED ORDER — ACETAMINOPHEN 325 MG PO TABS
650.0000 mg | ORAL_TABLET | Freq: Four times a day (QID) | ORAL | Status: DC | PRN
  Administered 2022-12-31 – 2023-01-01 (×2): 650 mg via ORAL
  Filled 2022-12-31 (×2): qty 2

## 2022-12-31 MED ORDER — ALBUTEROL SULFATE (2.5 MG/3ML) 0.083% IN NEBU: 2.5000 mg | INHALATION_SOLUTION | Freq: Four times a day (QID) | RESPIRATORY_TRACT | Status: DC | PRN

## 2022-12-31 MED ORDER — HYDRALAZINE HCL 20 MG/ML IJ SOLN: 10.0000 mg | INTRAMUSCULAR | Status: DC | PRN

## 2022-12-31 MED ORDER — SODIUM CHLORIDE 0.9 % IV BOLUS
500.0000 mL | Freq: Once | INTRAVENOUS | Status: AC
  Administered 2022-12-31: 500 mL via INTRAVENOUS

## 2022-12-31 MED ORDER — PROPRANOLOL HCL ER 120 MG PO CP24: 120.0000 mg | ORAL_CAPSULE | Freq: Every day | ORAL | Status: DC

## 2022-12-31 MED ORDER — SODIUM CHLORIDE 0.9% FLUSH
3.0000 mL | Freq: Two times a day (BID) | INTRAVENOUS | Status: DC
  Administered 2022-12-31 – 2023-01-02 (×5): 3 mL via INTRAVENOUS

## 2022-12-31 MED ORDER — MELATONIN 5 MG PO TABS
5.0000 mg | ORAL_TABLET | Freq: Every evening | ORAL | Status: DC | PRN
  Administered 2022-12-31 – 2023-01-02 (×2): 5 mg via ORAL
  Filled 2022-12-31 (×2): qty 1

## 2022-12-31 MED ORDER — METOCLOPRAMIDE HCL 5 MG/ML IJ SOLN: 10.0000 mg | Freq: Once | INTRAMUSCULAR | Status: DC

## 2022-12-31 MED ORDER — CLONIDINE HCL 0.2 MG PO TABS: 0.2000 mg | ORAL_TABLET | Freq: Three times a day (TID) | ORAL | Status: DC

## 2022-12-31 MED ORDER — DIPHENHYDRAMINE HCL 50 MG/ML IJ SOLN
25.0000 mg | Freq: Once | INTRAMUSCULAR | Status: AC
  Administered 2022-12-31: 25 mg via INTRAVENOUS
  Filled 2022-12-31: qty 1

## 2022-12-31 MED ORDER — SERTRALINE HCL 100 MG PO TABS
100.0000 mg | ORAL_TABLET | Freq: Every day | ORAL | Status: DC
  Administered 2023-01-01 (×2): 100 mg via ORAL
  Filled 2022-12-31 (×2): qty 1

## 2022-12-31 MED ORDER — IOHEXOL 350 MG/ML SOLN
70.0000 mL | Freq: Once | INTRAVENOUS | Status: AC | PRN
  Administered 2022-12-31: 70 mL via INTRAVENOUS

## 2022-12-31 MED ORDER — KETOROLAC TROMETHAMINE 15 MG/ML IJ SOLN
15.0000 mg | Freq: Once | INTRAMUSCULAR | Status: AC
  Administered 2022-12-31: 15 mg via INTRAVENOUS
  Filled 2022-12-31: qty 1

## 2022-12-31 MED ORDER — FUROSEMIDE 10 MG/ML IJ SOLN
40.0000 mg | Freq: Two times a day (BID) | INTRAMUSCULAR | Status: DC
  Filled 2022-12-31: qty 4

## 2022-12-31 MED ORDER — FUROSEMIDE 10 MG/ML IJ SOLN
20.0000 mg | Freq: Once | INTRAMUSCULAR | Status: AC
  Administered 2022-12-31: 20 mg via INTRAVENOUS
  Filled 2022-12-31: qty 2

## 2022-12-31 MED ORDER — MORPHINE SULFATE (PF) 2 MG/ML IV SOLN
2.0000 mg | INTRAVENOUS | Status: DC | PRN
Start: 2022-12-31 — End: 2023-01-02
  Administered 2022-12-31 – 2023-01-02 (×2): 2 mg via INTRAVENOUS
  Filled 2022-12-31 (×2): qty 1

## 2022-12-31 MED ORDER — PROPRANOLOL HCL ER 60 MG PO CP24
120.0000 mg | ORAL_CAPSULE | Freq: Every day | ORAL | Status: DC
  Filled 2022-12-31: qty 2

## 2022-12-31 MED ORDER — TRIMETHOBENZAMIDE HCL 100 MG/ML IM SOLN
200.0000 mg | Freq: Four times a day (QID) | INTRAMUSCULAR | Status: DC | PRN
  Administered 2023-01-01: 200 mg via INTRAMUSCULAR
  Filled 2022-12-31 (×2): qty 2

## 2022-12-31 MED ORDER — LABETALOL HCL 5 MG/ML IV SOLN
20.0000 mg | Freq: Once | INTRAVENOUS | Status: AC
  Administered 2022-12-31: 20 mg via INTRAVENOUS
  Filled 2022-12-31: qty 4

## 2022-12-31 MED ORDER — ENOXAPARIN SODIUM 40 MG/0.4ML IJ SOSY
40.0000 mg | PREFILLED_SYRINGE | INTRAMUSCULAR | Status: DC
  Administered 2022-12-31 – 2023-01-02 (×3): 40 mg via SUBCUTANEOUS
  Filled 2022-12-31 (×3): qty 0.4

## 2022-12-31 MED ORDER — HYDROCODONE-ACETAMINOPHEN 5-325 MG PO TABS
1.0000 | ORAL_TABLET | Freq: Four times a day (QID) | ORAL | Status: DC | PRN
  Administered 2022-12-31 – 2023-01-02 (×5): 1 via ORAL
  Filled 2022-12-31 (×5): qty 1

## 2022-12-31 NOTE — ED Notes (Signed)
EEG at bedside.

## 2022-12-31 NOTE — ED Notes (Signed)
Dr. Rancour at the bedside.  

## 2022-12-31 NOTE — ED Notes (Signed)
Pt reported chest pain, new EKG taken, Neurology at bedside - Dr. Otelia Limes. Pt also reports yeast growth on the left elbow in the shoulder sling. Pt is alert and oriented, speaking in full sentences. Pt reports dizziness and pulsating headache.

## 2022-12-31 NOTE — Procedures (Signed)
Patient Name: [name]  MRN: [id number]  Epilepsy Attending: Charlsie Quest  Referring Physician/Provider: Caryl Pina, MD  Date: 12/31/2022  Duration: 22.37 mins  Patient history:  42 y.o. female with a PMHx of anxiety, depression, ADHD, prior sleeve gastrectomy, pseudotumor cerebri and migraine headaches who presents to the ED with acute onset of visual blurring, severe headache and malignant HTN. MRI brain reveals findings most consistent with PRES. EEG to evaluate for seizure  Level of alertness: Awake  AEDs during EEG study: None  Technical aspects: This EEG study was done with scalp electrodes positioned according to the 10-20 International system of electrode placement. Electrical activity was reviewed with band pass filter of 1-70Hz , sensitivity of 7 uV/mm, display speed of 27mm/sec with a 60Hz  notched filter applied as appropriate. EEG data were recorded continuously and digitally stored.  Video monitoring was available and reviewed as appropriate.  Description: The posterior dominant rhythm consists of 9 Hz activity of moderate voltage (25-35 uV) seen predominantly in posterior head regions, symmetric and reactive to eye opening and eye closing. Hyperventilation and photic stimulation were not performed.     IMPRESSION: This study is within normal limits. No seizures or epileptiform discharges were seen throughout the recording.  A normal interictal EEG does not exclude the diagnosis of epilepsy.  Karoline Fleer Annabelle Harman

## 2022-12-31 NOTE — Plan of Care (Signed)
  Problem: Education: Goal: Ability to demonstrate management of disease process will improve Outcome: Progressing Goal: Ability to verbalize understanding of medication therapies will improve Outcome: Progressing Goal: Individualized Educational Video(s) Outcome: Progressing   Problem: Cardiac: Goal: Ability to achieve and maintain adequate cardiopulmonary perfusion will improve Outcome: Progressing

## 2022-12-31 NOTE — ED Notes (Addendum)
Pt return from xray, NAD noted, A&O x4

## 2022-12-31 NOTE — ED Notes (Signed)
Pt given warm blanket, ice pack, beverage, and daily care items.

## 2022-12-31 NOTE — ED Notes (Signed)
Pt in restroom, unable to complete admit at this time.

## 2022-12-31 NOTE — ED Provider Notes (Signed)
EMERGENCY DEPARTMENT AT [facility] Provider Note   CSN: [id number] Arrival date & time: 12/30/22  2329     History  Chief Complaint  Patient presents with   Headache   Hypertension    [name] is a 42 y.o. female.  Patient with a history of hypertension, depression, GERD, pseudotumor cerebri, migraine headaches presents with headache and confusion.  She reports she had a migraine headache earlier in the day that came on gradually and felt like her usual Of migraine.  It was associated with nausea and blurry vision to the point where she could not read the medication bottle to get her Maxalt.  This headache seem to abate after taking a nap.  She got up in the afternoon time and was going to take a shower with her husband.  Husband reports she suddenly became confused was asking bizarre questions and not making sense.  He states she became "dead weight" and almost passed out.  He had a catcher and she did not fall or lose consciousness.  She was confused asking bizarre questions and not making sense for more than 30 minutes.  At that time she apparently had a headache as well that is typical of her usual migraine headaches.  She feels like she is back to baseline now was still having a headache and blurry vision.  She states she did have blurry vision completely with her migraine headache earlier in the day.  She is alert and oriented x 3 currently with no focal deficits.  Her left arm is in a sling from previous shoulder dislocation.  The history is provided by the patient.  Headache Associated symptoms: dizziness, nausea and weakness   Associated symptoms: no abdominal pain, no congestion, no cough, no fever, no myalgias and no vomiting   Hypertension Associated symptoms include headaches. Pertinent negatives include no chest pain, no abdominal pain and no shortness of breath.       Home Medications Prior to Admission medications   Medication Sig Start  Date End Date Taking? Authorizing Provider  ALPRAZolam (XANAX XR) 0.5 MG 24 hr tablet Take 1 tablet by mouth daily. 04/22/19   [provider]  cloNIDine (CATAPRES) 0.1 MG tablet Take 1 tablet (0.1 mg total) by mouth 3 (three) times daily. Patient taking differently: Take 0.2 mg by mouth 3 (three) times daily. 09/16/22   Geoffery Lyons, MD  diphenhydrAMINE (BENADRYL) 25 MG tablet Take 50 mg by mouth daily as needed for allergies.    [provider]  furosemide (LASIX) 20 MG tablet Take 10-20 mg by mouth daily as needed. 05/12/21   [provider]  Galcanezumab-gnlm (EMGALITY) 120 MG/ML SOAJ Inject 240 mg into the skin every 30 (thirty) days. 11/18/22   Glean Salvo, NP  Galcanezumab-gnlm (EMGALITY) 120 MG/ML SOAJ Inject 120 mg into the skin every 30 (thirty) days. 11/18/22   Glean Salvo, NP  hydrochlorothiazide (MICROZIDE) 12.5 MG capsule Take 12.5 mg by mouth as needed. 09/20/18   [provider]  ketorolac (TORADOL) 10 MG tablet Take 1 tablet (10 mg total) by mouth every 6 (six) hours as needed. 12/23/22   Ernie Avena, MD  naratriptan (AMERGE) 2.5 MG tablet Take 1 tablet (2.5 mg total) by mouth as needed for migraine. Take one (1) tablet at onset of headache; if returns or does not resolve, may repeat after 4 hours; do not exceed five (5) mg in 24 hours. 01/15/22   Glean Salvo, NP  ondansetron (ZOFRAN-ODT) 8 MG disintegrating tablet 8mg  ODT q4 hours prn nausea 09/16/22   Geoffery Lyons, MD  promethazine (PHENERGAN) 25 MG tablet Take 1 tablet (25 mg total) by mouth every 6 (six) hours as needed for nausea or vomiting. 11/18/22   Glean Salvo, NP  propranolol ER (INDERAL LA) 120 MG 24 hr capsule Take 120 mg by mouth daily.    [provider]  Rimegepant Sulfate (NURTEC) 75 MG TBDP Take 1 tablet (75 mg total) by mouth as needed. Take 1 tablet at onset of headache, max is 1 tablet in 24 hours. 11/18/22   Glean Salvo, NP  sertraline (ZOLOFT) 100 MG tablet  Take 1 tablet by mouth daily. 11/26/20   [provider]  tiZANidine (ZANAFLEX) 4 MG tablet Take 1 tablet (4 mg total) by mouth every 6 (six) hours as needed for muscle spasms. 11/18/22   Glean Salvo, NP  Vitamin D, Ergocalciferol, (DRISDOL) 1.25 MG (50000 UNIT) CAPS capsule Take 50,000 Units by mouth 2 (two) times a week.    [provider]  zonisamide (ZONEGRAN) 100 MG capsule Take 1 capsule (100 mg total) by mouth at bedtime. 11/18/22   Glean Salvo, NP      Allergies    Aspirin, Duloxetine hcl, Lorazepam, Naproxen, Penicillins, Cephalexin, Duloxetine, Buspar [buspirone], Macrobid [nitrofurantoin monohyd macro], Sudafed [pseudoephedrine hcl], and Topiramate    Review of Systems   Review of Systems  Constitutional:  Negative for activity change and fever.  HENT:  Negative for congestion and rhinorrhea.   Eyes:  Positive for visual disturbance.  Respiratory:  Negative for cough, chest tightness and shortness of breath.   Cardiovascular:  Negative for chest pain.  Gastrointestinal:  Positive for nausea. Negative for abdominal pain and vomiting.  Genitourinary:  Negative for dysuria and hematuria.  Musculoskeletal:  Negative for arthralgias and myalgias.  Skin:  Negative for rash.  Neurological:  Positive for dizziness, speech difficulty, weakness, light-headedness and headaches.   all other systems are negative except as noted in the HPI and PMH.    Physical Exam Updated Vital Signs BP (!) 153/105   Pulse 74   Temp 98.2 F (36.8 C) (Oral)   Resp 15   Ht 5\' 4"  (1.626 m)   Wt 117.9 kg   LMP 06/12/2015 Comment: negative pregnancy test result today.  SpO2 100%   BMI 44.63 kg/m  Physical Exam Vitals and nursing note reviewed.  Constitutional:      General: She is not in acute distress.    Appearance: She is well-developed.  HENT:     Head: Normocephalic and atraumatic.     Mouth/Throat:     Pharynx: No oropharyngeal exudate.  Eyes:     Conjunctiva/sclera:  Conjunctivae normal.     Pupils: Pupils are equal, round, and reactive to light.  Neck:     Comments: No meningismus. Cardiovascular:     Rate and Rhythm: Normal rate and regular rhythm.     Heart sounds: Normal heart sounds. No murmur heard. Pulmonary:     Effort: Pulmonary effort is normal. No respiratory distress.     Breath sounds: Normal breath sounds.  Abdominal:     Palpations: Abdomen is soft.     Tenderness: There is no abdominal tenderness. There is no guarding or rebound.  Musculoskeletal:        General: No tenderness. Normal range of motion.     Cervical back: Normal range of motion and neck supple.  Skin:  General: Skin is warm.  Neurological:     Mental Status: She is alert and oriented to person, place, and time.     Cranial Nerves: No cranial nerve deficit.     Motor: No abnormal muscle tone.     Coordination: Coordination normal.     Comments: CN 2-12 intact, no ataxia on finger to nose, no nystagmus, 5/5 strength throughout, no pronator drift,   Left arm in sling.  No ataxia on finger-to-nose with right arm.  Psychiatric:        Behavior: Behavior normal.     ED Results / Procedures / Treatments   Labs (all labs ordered are listed, but only abnormal results are displayed) Labs Reviewed  COMPREHENSIVE METABOLIC PANEL - Abnormal; Notable for the following components:      Result Value   CO2 21 (*)    Glucose, Bld 146 (*)    All other components within normal limits  CBC - Abnormal; Notable for the following components:   WBC 15.8 (*)    Hemoglobin 15.2 (*)    Platelets 504 (*)    All other components within normal limits  ACETAMINOPHEN LEVEL - Abnormal; Notable for the following components:   Acetaminophen (Tylenol), Serum <10 (*)    All other components within normal limits  SALICYLATE LEVEL - Abnormal; Notable for the following components:   Salicylate Lvl <7.0 (*)    All other components within normal limits  RAPID URINE DRUG SCREEN, HOSP  PERFORMED - Abnormal; Notable for the following components:   Benzodiazepines POSITIVE (*)    All other components within normal limits  BRAIN NATRIURETIC PEPTIDE - Abnormal; Notable for the following components:   B Natriuretic Peptide 1,141.4 (*)    All other components within normal limits  TROPONIN I (HIGH SENSITIVITY) - Abnormal; Notable for the following components:   Troponin I (High Sensitivity) 65 (*)    All other components within normal limits  TROPONIN I (HIGH SENSITIVITY) - Abnormal; Notable for the following components:   Troponin I (High Sensitivity) 67 (*)    All other components within normal limits  LIPASE, BLOOD  ETHANOL  HCG, SERUM, QUALITATIVE  HIV ANTIBODY (ROUTINE TESTING W REFLEX)  TSH  PROCALCITONIN  DIFFERENTIAL  HEMOGLOBIN A1C  MAGNESIUM    EKG EKG Interpretation Date/Time:  Wednesday December 31 2022 04:04:51 EDT Ventricular Rate:  58 PR Interval:  156 QRS Duration:  95 QT Interval:  514 QTC Calculation: 505 R Axis:   39  Text Interpretation: Sinus rhythm Low voltage, precordial leads Consider anterior infarct No significant change was found Confirmed by Glynn Octave [phone]) on 12/31/2022 4:07:55 AM  Radiology DG Chest 2 View  Result Date: 12/31/2022 CLINICAL DATA:  42 year old female with history of edema on neck CT. EXAM: CHEST - 2 VIEW COMPARISON:  Chest x-ray 10/22/2007. FINDINGS: Lung volumes are low. No consolidative airspace disease. No pleural effusions. No pneumothorax. No pulmonary nodule or mass noted. Pulmonary vasculature and the cardiomediastinal silhouette are within normal limits. IMPRESSION: Low lung volumes without radiographic evidence of acute cardiopulmonary disease. Electronically Signed   By: Trudie Reed M.D.   On: 12/31/2022 06:02   MR BRAIN WO CONTRAST  Result Date: 12/31/2022 CLINICAL DATA:  Headache and high blood pressure. Patient did not take blood pressure medication today EXAM: MRI HEAD WITHOUT CONTRAST  TECHNIQUE: Multiplanar, multiecho pulse sequences of the brain and surrounding structures were obtained without intravenous contrast. COMPARISON:  CTA of the head neck from earlier today FINDINGS: Brain: Patchy  T2 hyperintensity along the cortical and subcortical regions of the bilateral posterior frontal, parietal, and especially occipital lobes without restricted diffusion or hemorrhage. No hydrocephalus or masslike finding. Vascular: Major flow voids are preserved. Patent dural sinuses on prior CTA. Skull and upper cervical spine: Normal marrow signal. Sinuses/Orbits: Negative. IMPRESSION: Patchy edematous appearance along the superior and posterior aspect of the cerebral hemispheres, consistent with posterior reversible encephalopathy syndrome. No complicating hemorrhage or completed infarct. Electronically Signed   By: Tiburcio Pea M.D.   On: 12/31/2022 05:01   CT ANGIO HEAD NECK W WO CM  Result Date: 12/31/2022 CLINICAL DATA:  Headache and dizziness EXAM: CT ANGIOGRAPHY HEAD AND NECK WITH AND WITHOUT CONTRAST TECHNIQUE: Multidetector CT imaging of the head and neck was performed using the standard protocol during bolus administration of intravenous contrast. Multiplanar CT image reconstructions and MIPs were obtained to evaluate the vascular anatomy. Carotid stenosis measurements (when applicable) are obtained utilizing NASCET criteria, using the distal internal carotid diameter as the denominator. RADIATION DOSE REDUCTION: This exam was performed according to the departmental dose-optimization program which includes automated exposure control, adjustment of the mA and/or kV according to patient size and/or use of iterative reconstruction technique. CONTRAST:  70mL OMNIPAQUE IOHEXOL 350 MG/ML SOLN COMPARISON:  None Available. FINDINGS: CT HEAD FINDINGS Brain: No mass, hemorrhage or extra-axial collection. CSF spaces are normal. Normal appearance of the brain parenchyma. Skull: The visualized skull  base, calvarium and extracranial soft tissues are normal. Sinuses/Orbits: No fluid levels or advanced mucosal thickening of the visualized paranasal sinuses. No mastoid or middle ear effusion. Normal orbits. CTA NECK FINDINGS SKELETON: No acute abnormality or high grade bony spinal canal stenosis. OTHER NECK: Normal pharynx, larynx and major salivary glands. No cervical lymphadenopathy. Unremarkable thyroid gland. UPPER CHEST: Small pleural effusions and mild pulmonary edema. AORTIC ARCH: There is no calcific atherosclerosis of the aortic arch. Normal variant aortic arch branching pattern with the brachiocephalic and left common carotid arteries sharing a common origin. RIGHT CAROTID SYSTEM: Normal without aneurysm, dissection or stenosis. LEFT CAROTID SYSTEM: Normal without aneurysm, dissection or stenosis. VERTEBRAL ARTERIES: Left dominant configuration. There is no dissection, occlusion or flow-limiting stenosis to the skull base (V1-V3 segments). CTA HEAD FINDINGS POSTERIOR CIRCULATION: --Vertebral arteries: Normal V4 segments. --Inferior cerebellar arteries: Normal. --Basilar artery: Normal. --Superior cerebellar arteries: Normal. --Posterior cerebral arteries (PCA): Normal. ANTERIOR CIRCULATION: --Intracranial internal carotid arteries: Normal. --Anterior cerebral arteries (ACA): Normal. --Middle cerebral arteries (MCA): Normal. VENOUS SINUSES: As permitted by contrast timing, patent. ANATOMIC VARIANTS: Fetal origin of the right posterior cerebral artery. Review of the MIP images confirms the above findings. IMPRESSION: 1. No emergent large vessel occlusion or high-grade stenosis of the intracranial arteries. 2. Small pleural effusions and mild pulmonary edema. Electronically Signed   By: Deatra Robinson M.D.   On: 12/31/2022 02:50    Procedures Procedures    Medications Ordered in ED Medications  cloNIDine (CATAPRES) tablet 0.2 mg (0.2 mg Oral Given 12/30/22 2350)  diphenhydrAMINE (BENADRYL) injection  25 mg (25 mg Intravenous Given 12/31/22 0056)  ketorolac (TORADOL) 15 MG/ML injection 15 mg (15 mg Intravenous Given 12/31/22 0056)    ED Course/ Medical Decision Making/ A&P                                 Medical Decision Making Amount and/or Complexity of Data Reviewed Independent Historian: spouse Labs: ordered. Decision-making details documented in ED Course. Radiology: ordered and independent  interpretation performed. Decision-making details documented in ED Course. ECG/medicine tests: ordered and independent interpretation performed. Decision-making details documented in ED Course.  Risk Prescription drug management. Decision regarding hospitalization.   Headache with altered mental status and elevated blood pressure with difficulty speaking and confusion.  Exam is nonfocal currently.  Code stroke not activated due to resolved symptoms  ` Labs show leukocytosis of 15.  No fever.  Hypertensive on arrival with history of same.  Did take her home clonidine. No meningismus.  Low suspicion for meningitis, SAH, temporal arteritis.   CT head and neck negative for acute pathology.  No hemorrhage.  No aneurysm or stenosis seen.  Results reviewed and interpreted by me. Does have apparent pulmonary edema on CT C spine. Wil add, EKG, troponin, BNP.  Discussed with Dr. Otelia Limes of neurology.  Recommends MRI brain to further assess episode of confusion and headache.  Troponin elevated but flat. No chest pain. BNP 1100.   MRI with concern for PRES. IV labetalol ordered.  D/w Dr. Otelia Limes who will evaluate. Consider IIH headache as well.   Admission d/w Dr. Loney Loh.         Final Clinical Impression(s) / ED Diagnoses Final diagnoses:  Bad headache  PRES (posterior reversible encephalopathy syndrome)    Rx / DC Orders ED Discharge Orders     None         Sanaiyah Kirchhoff, Jeannett Senior, MD 12/31/22 [phone]

## 2022-12-31 NOTE — H&P (Signed)
History and Physical    Patient: [name]:[id number] DOB: 29-Sep-1980 DOA: 12/30/2022 DOS: the patient was seen and examined on 12/31/2022 PCP: Morrell Riddle, PA-C  Patient coming from: Home  via EMS  Chief Complaint:  Chief Complaint  Patient presents with   Headache   Hypertension   HPI: [name] is a 42 y.o. female with medical history significant of pseudotumor cerebri, headaches, anxiety, depression, ADHD, and morbid obesity who presents after having headache with blurry vision and confusion yesterday.  She reported having a migraine headache earlier than the day with associated nausea and transient blurry vision that is not uncommon.  She had taken a nap and later on that evening had gotten up to take a shower with her husband.  Husband reports that  as she was walking into the shower she became confused, reported that she was unable to see, and was talking out of her head.  Reported that she did not know who they were thereafter passed out.  Patient does not recall anything besides getting up to go take a shower.  At this time she feels back to baseline and notes that blurry vision has resolved.  She reports being on clonidine 0.2 mg 3 times daily after history of opioid abuse which helps with her anxiety symptoms.  Patient also reports taking propranolol at night for symptoms.  She had been seen in the emergency department at [facility] 2 days ago after having a left shoulder injury where she got up and tripped falling onto her left shoulder.  Found to have anterior shoulder dislocation.  She has been wearing his sling as recommended, but does report having continued left shoulder pain.  She was scheduled to have a follow-up appointment with Dr. Ranell Patrick of orthopedics today.   In the emergency department patient was noted to be afebrile with respirations 15-26, systolic blood pressures elevated up to 174, and all other vital signs maintained.  CT angiogram of the  head noted no emergent large vessel occlusion or high-grade stenosis, and small pleural effusions with mild pulmonary edema.  Labs significant for WBC 15.8, hemoglobin 15.2, platelets 504, CO2 21, glucose 146, \troponin 65->67, and BNP 1141.4.  MRI of the brain noted patchy edematous appearance along the superior and posterior aspect of the cerebral hemispheres consistent with posterior reversible encephalopathy syndrome.  Chest x-ray noted low lung volumes without radiographic evidence of acute pulmonary disease.  Patient having given ketorolac 15 mg IV, Benadryl 25 mg IV, clonidine 0.2 mg p.o., labetalol 20 mg IV, and Lasix 20 mg IV.  Review of Systems: As mentioned in the history of present illness. All other systems reviewed and are negative. Past Medical History:  Diagnosis Date   ADHD (attention deficit hyperactivity disorder)    ADHD, adult residual type    Anxiety    Depression    Family history of adverse reaction to anesthesia    sister has PONV   GERD (gastroesophageal reflux disease)    Headache    otc med prn - last one 2 months ago   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   Pseudotumor cerebri    Past Surgical History:  Procedure Laterality Date   ABDOMINAL HYSTERECTOMY     CESAREAN SECTION     x 2   CYSTOSCOPY N/A 06/19/2015   Procedure: CYSTOSCOPY;  Surgeon: Jaymes Graff, MD;  Location: [facility];  Service: Gynecology;  Laterality: N/A;   GASTRECTOMY     sleeve, [facility]  IR GENERIC HISTORICAL  05/12/2016   IR FLUORO GUIDED NEEDLE PLC ASPIRATION/INJECTION LOC 05/12/2016 Paulina Fusi, MD [facility]   LAPAROSCOPIC VAGINAL HYSTERECTOMY WITH SALPINGECTOMY Bilateral 06/19/2015   Procedure: LAPAROSCOPIC ASSISTED VAGINAL HYSTERECTOMY WITH SALPINGECTOMY;  Surgeon: Jaymes Graff, MD;  Location: [facility];  Service: Gynecology;  Laterality: Bilateral;   lumbar pucture     x2   SACROILIAC JOINT FUSION Left 10/28/2017   Procedure: SACROILIAC JOINT FUSION;  Surgeon: Venita Lick, MD;  Location: [facility] OR;  Service: Orthopedics;  Laterality: Left;  90 mins   TONSILLECTOMY     Social History:  reports that she has never smoked. She has never used smokeless tobacco. She reports current alcohol use. She reports that she does not use drugs.  Allergies  Allergen Reactions   Aspirin Other (See Comments)    Stomach pain Severe abdominal pain   Duloxetine Hcl Other (See Comments)    Blisters, "shut kidneys down"   Lorazepam Other (See Comments)    "Can't remember anything while on it" aggressive    Naproxen Nausea And Vomiting and Other (See Comments)    cramping   Penicillins Hives    Has patient had a PCN reaction causing immediate rash, facial/tongue/throat swelling, SOB or lightheadedness with hypotension: No Has patient had a PCN reaction causing severe rash involving mucus membranes or skin necrosis: No Has patient had a PCN reaction that required hospitalization No Has patient had a PCN reaction occurring within the last 10 years: No If all of the above answers are "NO", then may proceed with Cephalosporin use.   Cephalexin     Unknown reaction   Buspar [Buspirone] Other (See Comments)    "felt weird, heart raced, cloudy"   Macrobid [Nitrofurantoin Monohyd Macro] Nausea Only and Other (See Comments)    Stomach pain   Sudafed [Pseudoephedrine Hcl] Hives   Topiramate Other (See Comments)    Numbness/tingling in mouth/feet    Family History  Problem Relation Age of Onset   Pneumonia Mother        Sepsis   Heart attack Father     Prior to Admission medications   Medication Sig Start Date End Date Taking? Authorizing Provider  ALPRAZolam (XANAX XR) 0.5 MG 24 hr tablet Take 1 tablet by mouth daily. 04/22/19  Yes [provider]  cloNIDine (CATAPRES) 0.1 MG tablet Take 1 tablet (0.1 mg total) by mouth 3 (three) times daily. Patient taking differently: Take 0.1-0.3 mg by mouth 3 (three) times daily. 09/16/22  Yes Delo, Riley Lam, MD   diphenhydrAMINE (BENADRYL) 25 MG tablet Take 25-50 mg by mouth daily as needed for allergies.   Yes [provider]  furosemide (LASIX) 20 MG tablet Take 10-20 mg by mouth daily as needed for edema. 05/12/21  Yes [provider]  hydrochlorothiazide (MICROZIDE) 12.5 MG capsule Take 12.5 mg by mouth daily as needed (for edema). 09/20/18  Yes [provider]  meloxicam (MOBIC) 7.5 MG tablet Take 7.5 mg by mouth 2 (two) times daily as needed. 12/26/22  Yes [provider]  naratriptan (AMERGE) 2.5 MG tablet Take 1 tablet (2.5 mg total) by mouth as needed for migraine. Take one (1) tablet at onset of headache; if returns or does not resolve, may repeat after 4 hours; do not exceed five (5) mg in 24 hours. 01/15/22  Yes Glean Salvo, NP  ondansetron (ZOFRAN-ODT) 8 MG disintegrating tablet 8mg  ODT q4 hours prn nausea 09/16/22  Yes Geoffery Lyons, MD  promethazine (PHENERGAN) 25 MG  tablet Take 1 tablet (25 mg total) by mouth every 6 (six) hours as needed for nausea or vomiting. 11/18/22  Yes Glean Salvo, NP  propranolol ER (INDERAL LA) 120 MG 24 hr capsule Take 120 mg by mouth daily.   Yes [provider]  sertraline (ZOLOFT) 100 MG tablet Take 1 tablet by mouth daily. 11/26/20  Yes [provider]  tiZANidine (ZANAFLEX) 4 MG tablet Take 1 tablet (4 mg total) by mouth every 6 (six) hours as needed for muscle spasms. Patient taking differently: Take 4 mg by mouth. 11/18/22  Yes Glean Salvo, NP  Vitamin D, Ergocalciferol, (DRISDOL) 1.25 MG (50000 UNIT) CAPS capsule Take 50,000 Units by mouth 2 (two) times a week. Thursday and Sunday   Yes [provider]  zonisamide (ZONEGRAN) 100 MG capsule Take 1 capsule (100 mg total) by mouth at bedtime. 11/18/22  Yes Glean Salvo, NP  Galcanezumab-gnlm (EMGALITY) 120 MG/ML SOAJ Inject 240 mg into the skin every 30 (thirty) days. Patient not taking: Reported on 12/31/2022 11/18/22   Glean Salvo, NP   Galcanezumab-gnlm [facility]) 120 MG/ML SOAJ Inject 120 mg into the skin every 30 (thirty) days. Patient not taking: Reported on 12/31/2022 11/18/22   Glean Salvo, NP  Rimegepant Sulfate (NURTEC) 75 MG TBDP Take 1 tablet (75 mg total) by mouth as needed. Take 1 tablet at onset of headache, max is 1 tablet in 24 hours. Patient not taking: Reported on 12/31/2022 11/18/22   Glean Salvo, NP    Physical Exam: Vitals:   12/31/22 0300 12/31/22 0301 12/31/22 0400 12/31/22 0641  BP: (!) 140/91  (!) 154/104 (!) 141/125  Pulse: 67  (!) 57 90  Resp: (!) 23  17 (!) 26  Temp:  98.1 F (36.7 C)    TempSrc:  Oral    SpO2: 100%  100% 100%  Weight:      Height:       Constitutional: Morbidly obese female currently in no acute distress. Eyes: PERRL, lids and conjunctivae normal ENMT: Mucous membranes are moist. Posterior pharynx clear of any exudate or lesions.  Neck: normal, supple, no masses, no thyromegaly Respiratory: clear to auscultation bilaterally, no wheezing, no crackles. Normal respiratory effort. No accessory muscle use.  Cardiovascular: Regular rate and rhythm, no murmurs / rubs / gallops.  Trace lower extremity edema.  2+ pedal pulses.   Abdomen: no tenderness, no masses palpated.   Bowel sounds positive.  Musculoskeletal: no clubbing / cyanosis. No joint deformity upper and lower extremities. Good ROM, no contractures. Normal muscle tone.  Skin: no rashes, lesions, ulcers. No induration Neurologic: CN 2-12 grossly intact. Sensation intact, DTR normal. Strength 5/5 in all 4.  Psychiatric: Normal judgment and insight. Alert and oriented x 3. Normal mood.   Data Reviewed:  EKG reveals sinus rhythm at 58 bpm with QTc 505.  Repeat EKG noted QTc 559.  Reviewed labs, imaging, and pertinent records as documented.  Assessment and Plan:  PRES Hypotension Acute.  Patient presented with complaints of headache and elevated blood pressure.  Blood pressure initially elevated up to 174/123.   CT angiogram of the head showed no acute abnormality, but subsequent MRI of the brain noted patchy edematous appearance along the superior and posterior aspect of the cerebral hemispheres.  Findings consistent with posterior reversible encephalopathy syndrome.  Patient had been given chronic pain 0.2 mg p.o., labetalol 20 mg IV, and Lasix IV.  Since that time systolic blood pressures have ranged from the  60s to 80s. -Admit to a telemetry bed -Neurochecks -Check TSH -Hold propranolol and clonidine due to subsequent hypotension. -Hydralazine IV as needed for elevated blood pressures -Appreciate neurology consultative services we will follow-up for any further recommendations  New onset congestive heart failure exacerbation Acute.  BNP elevated at 1414.4.  CT a of the head and neck noting small pleural effusions with mild pulmonary edema.  Patient has been given Lasix 20 mg IV. -Strict I&Os and daily weights -Check echocardiogram -Hold IV diuresis at this time due to hypotension -Consider need to formally consult cardiology in a.m. based off of heart function  Left shoulder dislocation pain Prior to arrival.  Patient had a presented to the emergency department on 10/20 after having a fall sustaining a left shoulder dislocation.  Since fall patient reports having continued pain in the left shoulder on -Check MRI of the shoulder  Elevated troponin Acute.  High-sensitivity troponin 65->67.  EKG without significant ischemic changes.  Thought secondary to demand in setting of CHF exacerbation. -Follow-up echocardiogram  Leukocytosis Possibly acute.  WBC elevated at 15.8, but had also been elevated at 18.7 on 09/16/2022. -Check procalcitonin  Prolonged QT interval Acute.  QTc noted to be elevated up to 559 on EKG. -Avoid QT prolonging medications -Check magnesium and correct abnormalities as needed  Polycythemia Thrombocytosis Possibly acute.  Hemoglobin noted to be 15.2 and platelet count  504 both which appears similar to previous labs done from 7/16. -Add-on differential if able -Recheck CBC in a.m.  Hyperglycemia Acute.  Acute.  On admission glucose noted to be elevated up to 146.  No known history of diabetes. -Check hemoglobin A1c  Anxiety/depression -Reassess on medically appropriate to resume home medication regimen of clonidine and propranolol  Morbid obesity BMI 44.63 kg/m Patient with prior history of gastric sleeve surgery. -Recommend outpatient follow-up with primary care provider   DVT prophylaxis: Lovenox Advance Care Planning:   Code Status: Full Code   Consults: Neurology  Family Communication: Husband updated at bedside  Severity of Illness: The appropriate patient status for this patient is INPATIENT. Inpatient status is judged to be reasonable and necessary in order to provide the required intensity of service to ensure the patient's safety. The patient's presenting symptoms, physical exam findings, and initial radiographic and laboratory data in the context of their chronic comorbidities is felt to place them at high risk for further clinical deterioration. Furthermore, it is not anticipated that the patient will be medically stable for discharge from the hospital within 2 midnights of admission.   * I certify that at the point of admission it is my clinical judgment that the patient will require inpatient hospital care spanning beyond 2 midnights from the point of admission due to high intensity of service, high risk for further deterioration and high frequency of surveillance required.*  Author: Clydie Braun, MD 12/31/2022 7:22 AM  For on call review [email or web address].

## 2022-12-31 NOTE — ED Notes (Signed)
ED TO INPATIENT HANDOFF REPORT  ED Nurse Name and Phone #: [phone]  S Name/Age/Gender [name] 42 y.o. female Room/Bed: 001C/001C  Code Status   Code Status: Full Code  Home/SNF/Other Home Patient oriented to: self, place, time, and situation Is this baseline? Yes   Triage Complete: Triage complete  Chief Complaint PRES (posterior reversible encephalopathy syndrome) [I67.83]  Triage Note Patient BIB [facility] from home with complaint of headache & high blood pressure, reports not taking night time BP meds yet today.  Was getting into shower when got dizzy, denies fall.   Patient reports taking home migraine medication with no relief of pain.   22g R FA  Patient reports that when she was going to take a shower she had an episode of confusion & reports having blurred vision.    Allergies Allergies  Allergen Reactions   Aspirin Other (See Comments)    Stomach pain Severe abdominal pain   Duloxetine Hcl Other (See Comments)    Blisters, "shut kidneys down"   Lorazepam Other (See Comments)    "Can't remember anything while on it" aggressive    Naproxen Nausea And Vomiting and Other (See Comments)    cramping   Penicillins Hives    Has patient had a PCN reaction causing immediate rash, facial/tongue/throat swelling, SOB or lightheadedness with hypotension: No Has patient had a PCN reaction causing severe rash involving mucus membranes or skin necrosis: No Has patient had a PCN reaction that required hospitalization No Has patient had a PCN reaction occurring within the last 10 years: No If all of the above answers are "NO", then may proceed with Cephalosporin use.   Cephalexin     Unknown reaction   Buspar [Buspirone] Other (See Comments)    "felt weird, heart raced, cloudy"   Macrobid [Nitrofurantoin Monohyd Macro] Nausea Only and Other (See Comments)    Stomach pain   Sudafed [Pseudoephedrine Hcl] Hives   Topiramate Other (See Comments)     Numbness/tingling in mouth/feet    Level of Care/Admitting Diagnosis ED Disposition     ED Disposition  Admit   Condition  --   Comment  Hospital Area: [facility] [[id number]]  Level of Care: Progressive [102]  Admit to Progressive based on following criteria: NEUROLOGICAL AND NEUROSURGICAL complex patients with significant risk of instability, who do not meet ICU criteria, yet require close observation or frequent assessment (< / = every 2 - 4 hours) with medical / nursing intervention.  Admit to Progressive based on following criteria: CARDIOVASCULAR & THORACIC of moderate stability with acute coronary syndrome symptoms/low risk myocardial infarction/hypertensive urgency/arrhythmias/heart failure potentially compromising stability and stable post cardiovascular intervention patients.  May admit patient to [facility] or [facility] if equivalent level of care is available:: No  Covid Evaluation: Asymptomatic - no recent exposure (last 10 days) testing not required  Diagnosis: PRES (posterior reversible encephalopathy syndrome) [[id number]]  Admitting Physician: Clydie Braun [[id number]]  Attending Physician: Clydie Braun [[id number]]  Certification:: I certify this patient will need inpatient services for at least 2 midnights          B Medical/Surgery History Past Medical History:  Diagnosis Date   ADHD (attention deficit hyperactivity disorder)    ADHD, adult residual type    Anxiety    Depression    Family history of adverse reaction to anesthesia    sister has PONV   GERD (gastroesophageal reflux disease)    Headache    otc med prn -  last one 2 months ago   New onset of congestive heart failure (HCC) 12/31/2022   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   Pseudotumor cerebri    Past Surgical History:  Procedure Laterality Date   ABDOMINAL HYSTERECTOMY     CESAREAN SECTION     x 2   CYSTOSCOPY N/A 06/19/2015   Procedure: CYSTOSCOPY;  Surgeon: Jaymes Graff, MD;  Location: [facility];  Service: Gynecology;  Laterality: N/A;   GASTRECTOMY     sleeve, [facility]   IR GENERIC HISTORICAL  05/12/2016   IR FLUORO GUIDED NEEDLE PLC ASPIRATION/INJECTION LOC 05/12/2016 Paulina Fusi, MD [facility]   LAPAROSCOPIC VAGINAL HYSTERECTOMY WITH SALPINGECTOMY Bilateral 06/19/2015   Procedure: LAPAROSCOPIC ASSISTED VAGINAL HYSTERECTOMY WITH SALPINGECTOMY;  Surgeon: Jaymes Graff, MD;  Location: [facility];  Service: Gynecology;  Laterality: Bilateral;   lumbar pucture     x2   SACROILIAC JOINT FUSION Left 10/28/2017   Procedure: SACROILIAC JOINT FUSION;  Surgeon: Venita Lick, MD;  Location: [facility] OR;  Service: Orthopedics;  Laterality: Left;  90 mins   TONSILLECTOMY       A IV Location/Drains/Wounds Patient Lines/Drains/Airways Status     Active Line/Drains/Airways     Name Placement date Placement time Site Days   Peripheral IV 12/30/22 22 G Anterior;Right Forearm 12/30/22  2333  Forearm  1   Peripheral IV 12/31/22 20 G Left;Posterior Forearm 12/31/22  0110  Forearm  less than 1            Intake/Output Last 24 hours  Intake/Output Summary (Last 24 hours) at 12/31/2022 1430 Last data filed at 12/31/2022 0934 Gross per 24 hour  Intake 3 ml  Output --  Net 3 ml    Labs/Imaging Results for orders placed or performed during the hospital encounter of 12/30/22 (from the past 48 hour(s))  Comprehensive metabolic panel     Status: Abnormal   Collection Time: 12/31/22 12:02 AM  Result Value Ref Range   Sodium 137 135 - 145 mmol/L   Potassium 4.6 3.5 - 5.1 mmol/L   Chloride 105 98 - 111 mmol/L   CO2 21 (L) 22 - 32 mmol/L   Glucose, Bld 146 (H) 70 - 99 mg/dL    Comment: Glucose reference range applies only to samples taken after fasting for at least 8 hours.   BUN 12 6 - 20 mg/dL   Creatinine, Ser 2.44 0.44 - 1.00 mg/dL   Calcium 9.4 8.9 - 01.0 mg/dL   Total Protein 7.3 6.5 - 8.1 g/dL   Albumin 3.7 3.5 - 5.0 g/dL   AST 28 15 - 41 U/L    ALT 41 0 - 44 U/L   Alkaline Phosphatase 98 38 - 126 U/L   Total Bilirubin 0.6 0.3 - 1.2 mg/dL   GFR, Estimated >27 >25 mL/min    Comment: (NOTE) Calculated using the CKD-EPI Creatinine Equation (2021)    Anion gap 11 5 - 15    Comment: Performed at [facility], [address] [place], [place] [address]  CBC     Status: Abnormal   Collection Time: 12/31/22 12:02 AM  Result Value Ref Range   WBC 15.8 (H) 4.0 - 10.5 K/uL   RBC 4.80 3.87 - 5.11 MIL/uL   Hemoglobin 15.2 (H) 12.0 - 15.0 g/dL   HCT 03.4 74.2 - 59.5 %   MCV 94.8 80.0 - 100.0 fL   MCH 31.7 26.0 - 34.0 pg   MCHC 33.4 30.0 - 36.0 g/dL  RDW 12.9 11.5 - 15.5 %   Platelets 504 (H) 150 - 400 K/uL   nRBC 0.0 0.0 - 0.2 %    Comment: Performed at [facility], [address], [place], [place] [address]  Lipase, blood     Status: None   Collection Time: 12/31/22 12:02 AM  Result Value Ref Range   Lipase 21 11 - 51 U/L    Comment: Performed at [facility], [address], [place], [place] [address]  Troponin I (High Sensitivity)     Status: Abnormal   Collection Time: 12/31/22 12:02 AM  Result Value Ref Range   Troponin I (High Sensitivity) 65 (H) <18 ng/L    Comment: (NOTE) Elevated high sensitivity troponin I (hsTnI) values and significant  changes across serial measurements may suggest ACS but many other  chronic and acute conditions are known to elevate hsTnI results.  Refer to the "Links" section for chest pain algorithms and additional  guidance. Performed at [facility], [address] [place], [place] [address]   Ethanol     Status: None   Collection Time: 12/31/22  1:10 AM  Result Value Ref Range   Alcohol, Ethyl (B) <10 <10 mg/dL    Comment: (NOTE) Lowest detectable limit for serum alcohol is 10 mg/dL.  For medical purposes only. Performed at [facility], [address] [place], [place] [address]   Acetaminophen level     Status: Abnormal   Collection Time: 12/31/22   1:10 AM  Result Value Ref Range   Acetaminophen (Tylenol), Serum <10 (L) 10 - 30 ug/mL    Comment: (NOTE) Therapeutic concentrations vary significantly. A range of 10-30 ug/mL  may be an effective concentration for many patients. However, some  are best treated at concentrations outside of this range. Acetaminophen concentrations >150 ug/mL at 4 hours after ingestion  and >50 ug/mL at 12 hours after ingestion are often associated with  toxic reactions.  Performed at [facility], [address], [place], [place] [address]   Salicylate level     Status: Abnormal   Collection Time: 12/31/22  1:10 AM  Result Value Ref Range   Salicylate Lvl <7.0 (L) 7.0 - 30.0 mg/dL    Comment: Performed at [facility], [address] [place], [place] [address]  hCG, serum, qualitative     Status: None   Collection Time: 12/31/22  2:14 AM  Result Value Ref Range   Preg, Serum NEGATIVE NEGATIVE    Comment:        THE SENSITIVITY OF THIS METHODOLOGY IS >10 mIU/mL. Performed at [facility], [address] [place], [place] [address]   Rapid urine drug screen (hospital performed)     Status: Abnormal   Collection Time: 12/31/22  3:02 AM  Result Value Ref Range   Opiates NONE DETECTED NONE DETECTED   Cocaine NONE DETECTED NONE DETECTED   Benzodiazepines POSITIVE (A) NONE DETECTED   Amphetamines NONE DETECTED NONE DETECTED   Tetrahydrocannabinol NONE DETECTED NONE DETECTED   Barbiturates NONE DETECTED NONE DETECTED    Comment: (NOTE) DRUG SCREEN FOR MEDICAL PURPOSES ONLY.  IF CONFIRMATION IS NEEDED FOR ANY PURPOSE, NOTIFY LAB WITHIN 5 DAYS.  LOWEST DETECTABLE LIMITS FOR URINE DRUG SCREEN Drug Class                     Cutoff (ng/mL) Amphetamine and metabolites    1000 Barbiturate and metabolites  200 Benzodiazepine                 200 Opiates and metabolites        300 Cocaine and metabolites        300 THC                            50 Performed at [facility], [address] [place], [place] [address]   Troponin I (High Sensitivity)     Status: Abnormal   Collection Time: 12/31/22  3:53 AM  Result Value Ref Range   Troponin I (High Sensitivity) 67 (H) <18 ng/L    Comment: (NOTE) Elevated high sensitivity troponin I (hsTnI) values and significant  changes across serial measurements may suggest ACS but many other  chronic and acute conditions are known to elevate hsTnI results.  Refer to the "Links" section for chest pain algorithms and additional  guidance. Performed at [facility], [address] [place], [place] [address]   Brain natriuretic peptide     Status: Abnormal   Collection Time: 12/31/22  3:53 AM  Result Value Ref Range   B Natriuretic Peptide 1,141.4 (H) 0.0 - 100.0 pg/mL    Comment: Performed at [facility], [address], [place], [place] [address]  HIV Antibody (routine testing w rflx)     Status: None   Collection Time: 12/31/22  8:56 AM  Result Value Ref Range   HIV Screen 4th Generation wRfx Non Reactive Non Reactive    Comment: Performed at [facility], [address] [place], [place] [address]  Procalcitonin     Status: None   Collection Time: 12/31/22  8:56 AM  Result Value Ref Range   Procalcitonin <0.10 ng/mL    Comment:        Interpretation: PCT (Procalcitonin) <= 0.5 ng/mL: Systemic infection (sepsis) is not likely. Local bacterial infection is possible. (NOTE)       Sepsis PCT Algorithm           Lower Respiratory Tract                                      Infection PCT Algorithm    ----------------------------     ----------------------------         PCT < 0.25 ng/mL                PCT < 0.10 ng/mL          Strongly encourage             Strongly discourage   discontinuation of antibiotics    initiation of antibiotics    ----------------------------     -----------------------------       PCT 0.25 - 0.50 ng/mL            PCT 0.10 - 0.25 ng/mL               OR        >80% decrease in PCT            Discourage initiation of  antibiotics      Encourage discontinuation           of antibiotics    ----------------------------     -----------------------------         PCT >= 0.50 ng/mL              PCT 0.26 - 0.50 ng/mL               AND        <80% decrease in PCT             Encourage initiation of                                             antibiotics       Encourage continuation           of antibiotics    ----------------------------     -----------------------------        PCT >= 0.50 ng/mL                  PCT > 0.50 ng/mL               AND         increase in PCT                  Strongly encourage                                      initiation of antibiotics    Strongly encourage escalation           of antibiotics                                     -----------------------------                                           PCT <= 0.25 ng/mL                                                 OR                                        > 80% decrease in PCT                                      Discontinue / Do not initiate                                             antibiotics  Performed at [facility], [address] [place], [place] [address]   Magnesium     Status: None   Collection Time: 12/31/22  8:56 AM  Result Value Ref Range   Magnesium 1.7 1.7 - 2.4 mg/dL    Comment: Performed at [facility], [address] [place], [place] [address]  TSH     Status: None   Collection Time: 12/31/22  8:56 AM  Result Value Ref Range   TSH 2.744 0.350 - 4.500 uIU/mL    Comment: Performed by a 3rd Generation assay with a functional sensitivity of <=0.01 uIU/mL. Performed at [facility], [address] [place], [place] [address]   Hemoglobin A1c     Status: None   Collection Time: 12/31/22  9:00 AM  Result Value Ref Range   Hgb A1c MFr Bld 5.4 4.8 - 5.6 %    Comment: (NOTE) Pre  diabetes:          5.7%-6.4%  Diabetes:              >6.4%  Glycemic control for   <7.0% adults with diabetes    Mean Plasma Glucose 108.28 mg/dL    Comment: Performed at [facility], [address], [place], [place] [address]  CBC with Differential/Platelet     Status: Abnormal   Collection Time: 12/31/22  9:00 AM  Result Value Ref Range   WBC 11.9 (H) 4.0 - 10.5 K/uL   RBC 3.89 3.87 - 5.11 MIL/uL   Hemoglobin 12.4 12.0 - 15.0 g/dL   HCT 91.4 78.2 - 95.6 %   MCV 97.4 80.0 - 100.0 fL   MCH 31.9 26.0 - 34.0 pg   MCHC 32.7 30.0 - 36.0 g/dL   RDW 21.3 08.6 - 57.8 %   Platelets 410 (H) 150 - 400 K/uL   nRBC 0.0 0.0 - 0.2 %   Neutrophils Relative % 74 %   Neutro Abs 8.8 (H) 1.7 - 7.7 K/uL   Lymphocytes Relative 19 %   Lymphs Abs 2.3 0.7 - 4.0 K/uL   Monocytes Relative 7 %   Monocytes Absolute 0.8 0.1 - 1.0 K/uL   Eosinophils Relative 0 %   Eosinophils Absolute 0.0 0.0 - 0.5 K/uL   Basophils Relative 0 %   Basophils Absolute 0.0 0.0 - 0.1 K/uL   Immature Granulocytes 0 %   Abs Immature Granulocytes 0.05 0.00 - 0.07 K/uL    Comment: Performed at [facility], [address] [place], [place] [address]   EEG adult  Result Date: 12/31/2022 Charlsie Quest, MD     12/31/2022 10:57 AM Patient Name: [name] MRN: [id number] Epilepsy Attending: Charlsie Quest Referring Physician/Provider: Caryl Pina, MD Date: 12/31/2022 Duration: 22.37 mins Patient history:  42 y.o. female with a PMHx of anxiety, depression, ADHD, prior sleeve gastrectomy, pseudotumor cerebri and migraine headaches who presents to the ED with acute onset of visual blurring, severe headache and malignant HTN. MRI brain reveals findings most consistent with PRES. EEG to evaluate for seizure Level of alertness: Awake AEDs during EEG study: None Technical aspects: This EEG study was done with scalp electrodes positioned according to the 10-20 International system of electrode placement. Electrical  activity was reviewed with band pass filter of 1-70Hz , sensitivity of 7 uV/mm, display speed of 63mm/sec with a 60Hz  notched filter applied as appropriate. EEG data were recorded continuously and digitally stored.  Video monitoring was available and reviewed as appropriate. Description: The posterior dominant rhythm consists of 9 Hz activity of moderate voltage (25-35 uV) seen predominantly in posterior head regions, symmetric and reactive to eye opening  and eye closing. Hyperventilation and photic stimulation were not performed.   IMPRESSION: This study is within normal limits. No seizures or epileptiform discharges were seen throughout the recording. A normal interictal EEG does not exclude the diagnosis of epilepsy. Charlsie Quest   DG Chest 2 View  Result Date: 12/31/2022 CLINICAL DATA:  41 year old female with history of edema on neck CT. EXAM: CHEST - 2 VIEW COMPARISON:  Chest x-ray 10/22/2007. FINDINGS: Lung volumes are low. No consolidative airspace disease. No pleural effusions. No pneumothorax. No pulmonary nodule or mass noted. Pulmonary vasculature and the cardiomediastinal silhouette are within normal limits. IMPRESSION: Low lung volumes without radiographic evidence of acute cardiopulmonary disease. Electronically Signed   By: Trudie Reed M.D.   On: 12/31/2022 06:02   MR BRAIN WO CONTRAST  Result Date: 12/31/2022 CLINICAL DATA:  Headache and high blood pressure. Patient did not take blood pressure medication today EXAM: MRI HEAD WITHOUT CONTRAST TECHNIQUE: Multiplanar, multiecho pulse sequences of the brain and surrounding structures were obtained without intravenous contrast. COMPARISON:  CTA of the head neck from earlier today FINDINGS: Brain: Patchy T2 hyperintensity along the cortical and subcortical regions of the bilateral posterior frontal, parietal, and especially occipital lobes without restricted diffusion or hemorrhage. No hydrocephalus or masslike finding. Vascular: Major  flow voids are preserved. Patent dural sinuses on prior CTA. Skull and upper cervical spine: Normal marrow signal. Sinuses/Orbits: Negative. IMPRESSION: Patchy edematous appearance along the superior and posterior aspect of the cerebral hemispheres, consistent with posterior reversible encephalopathy syndrome. No complicating hemorrhage or completed infarct. Electronically Signed   By: Tiburcio Pea M.D.   On: 12/31/2022 05:01   CT ANGIO HEAD NECK W WO CM  Result Date: 12/31/2022 CLINICAL DATA:  Headache and dizziness EXAM: CT ANGIOGRAPHY HEAD AND NECK WITH AND WITHOUT CONTRAST TECHNIQUE: Multidetector CT imaging of the head and neck was performed using the standard protocol during bolus administration of intravenous contrast. Multiplanar CT image reconstructions and MIPs were obtained to evaluate the vascular anatomy. Carotid stenosis measurements (when applicable) are obtained utilizing NASCET criteria, using the distal internal carotid diameter as the denominator. RADIATION DOSE REDUCTION: This exam was performed according to the departmental dose-optimization program which includes automated exposure control, adjustment of the mA and/or kV according to patient size and/or use of iterative reconstruction technique. CONTRAST:  70mL OMNIPAQUE IOHEXOL 350 MG/ML SOLN COMPARISON:  None Available. FINDINGS: CT HEAD FINDINGS Brain: No mass, hemorrhage or extra-axial collection. CSF spaces are normal. Normal appearance of the brain parenchyma. Skull: The visualized skull base, calvarium and extracranial soft tissues are normal. Sinuses/Orbits: No fluid levels or advanced mucosal thickening of the visualized paranasal sinuses. No mastoid or middle ear effusion. Normal orbits. CTA NECK FINDINGS SKELETON: No acute abnormality or high grade bony spinal canal stenosis. OTHER NECK: Normal pharynx, larynx and major salivary glands. No cervical lymphadenopathy. Unremarkable thyroid gland. UPPER CHEST: Small pleural  effusions and mild pulmonary edema. AORTIC ARCH: There is no calcific atherosclerosis of the aortic arch. Normal variant aortic arch branching pattern with the brachiocephalic and left common carotid arteries sharing a common origin. RIGHT CAROTID SYSTEM: Normal without aneurysm, dissection or stenosis. LEFT CAROTID SYSTEM: Normal without aneurysm, dissection or stenosis. VERTEBRAL ARTERIES: Left dominant configuration. There is no dissection, occlusion or flow-limiting stenosis to the skull base (V1-V3 segments). CTA HEAD FINDINGS POSTERIOR CIRCULATION: --Vertebral arteries: Normal V4 segments. --Inferior cerebellar arteries: Normal. --Basilar artery: Normal. --Superior cerebellar arteries: Normal. --Posterior cerebral arteries (PCA): Normal. ANTERIOR CIRCULATION: --Intracranial internal carotid arteries:  Normal. --Anterior cerebral arteries (ACA): Normal. --Middle cerebral arteries (MCA): Normal. VENOUS SINUSES: As permitted by contrast timing, patent. ANATOMIC VARIANTS: Fetal origin of the right posterior cerebral artery. Review of the MIP images confirms the above findings. IMPRESSION: 1. No emergent large vessel occlusion or high-grade stenosis of the intracranial arteries. 2. Small pleural effusions and mild pulmonary edema. Electronically Signed   By: Deatra Robinson M.D.   On: 12/31/2022 02:50    Pending Labs Unresulted Labs (From admission, onward)     Start     Ordered   01/01/23 0500  CBC  Tomorrow morning,   R        12/31/22 0819   01/01/23 0500  Basic metabolic panel  Tomorrow morning,   R        12/31/22 0819            Vitals/Pain Today's Vitals   12/31/22 1200 12/31/22 1213 12/31/22 1230 12/31/22 1300  BP: (!) 83/48  (!) 86/46 (!) 89/58  Pulse: 72 70 76 77  Resp: 18 17 (!) 23 17  Temp:  98 F (36.7 C)    TempSrc:      SpO2: 95% 98% 99% 100%  Weight:      Height:      PainSc:        Isolation Precautions No active isolations  Medications Medications  furosemide  (LASIX) injection 40 mg (has no administration in time range)  enoxaparin (LOVENOX) injection 40 mg (40 mg Subcutaneous Given 12/31/22 0933)  sodium chloride flush (NS) 0.9 % injection 3 mL (3 mLs Intravenous Given 12/31/22 0934)  acetaminophen (TYLENOL) tablet 650 mg (650 mg Oral Given 12/31/22 0932)    Or  acetaminophen (TYLENOL) suppository 650 mg ( Rectal See Alternative 12/31/22 0932)  trimethobenzamide (TIGAN) injection 200 mg (has no administration in time range)  albuterol (PROVENTIL) (2.5 MG/3ML) 0.083% nebulizer solution 2.5 mg (has no administration in time range)  hydrALAZINE (APRESOLINE) injection 10 mg (has no administration in time range)  morphine (PF) 2 MG/ML injection 2 mg (has no administration in time range)  HYDROcodone-acetaminophen (NORCO/VICODIN) 5-325 MG per tablet 1 tablet (has no administration in time range)  cloNIDine (CATAPRES) tablet 0.2 mg (0.2 mg Oral Given 12/30/22 2350)  diphenhydrAMINE (BENADRYL) injection 25 mg (25 mg Intravenous Given 12/31/22 0056)  ketorolac (TORADOL) 15 MG/ML injection 15 mg (15 mg Intravenous Given 12/31/22 0056)  iohexol (OMNIPAQUE) 350 MG/ML injection 70 mL (70 mLs Intravenous Contrast Given 12/31/22 0117)  labetalol (NORMODYNE) injection 20 mg (20 mg Intravenous Given 12/31/22 0520)  furosemide (LASIX) injection 20 mg (20 mg Intravenous Given 12/31/22 0558)  sodium chloride 0.9 % bolus 500 mL (500 mLs Intravenous New Bag/Given 12/31/22 1408)    Mobility walks     Focused Assessments Neuro Assessment Handoff:  Swallow screen pass? Yes  Cardiac Rhythm: Normal sinus rhythm       Neuro Assessment: Within Defined Limits Neuro Checks:      Has TPA been given? No If patient is a Neuro Trauma and patient is going to OR before floor call report to 4N Charge nurse: [phone] or [phone]   R Recommendations: See Admitting Provider Note  Report given to:   Additional Notes:

## 2022-12-31 NOTE — Consult Note (Signed)
NEURO HOSPITALIST CONSULT NOTE   Requesting physician: Dr. Manus Gunning  Reason for Consult: Findings on MRI concerning for PRES  History obtained from:   Patient and Chart     HPI:                                                                                                                                          [name] is an 42 y.o. female with a PMHx of anxiety, depression, ADHD, prior sleeve gastrectomy, pseudotumor cerebri and migraine headaches who presents to the ED with acute onset of visual blurring, severe headache and malignant HTN. MRI brain reveals findings most consistent with PRES.   Past Medical History:  Diagnosis Date   ADHD (attention deficit hyperactivity disorder)    ADHD, adult residual type    Anxiety    Depression    Family history of adverse reaction to anesthesia    sister has PONV   GERD (gastroesophageal reflux disease)    Headache    otc med prn - last one 2 months ago   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   Pseudotumor cerebri     Past Surgical History:  Procedure Laterality Date   ABDOMINAL HYSTERECTOMY     CESAREAN SECTION     x 2   CYSTOSCOPY N/A 06/19/2015   Procedure: CYSTOSCOPY;  Surgeon: Jaymes Graff, MD;  Location: [facility];  Service: Gynecology;  Laterality: N/A;   GASTRECTOMY     sleeve, [facility]   IR GENERIC HISTORICAL  05/12/2016   IR FLUORO GUIDED NEEDLE PLC ASPIRATION/INJECTION LOC 05/12/2016 Paulina Fusi, MD [facility]   LAPAROSCOPIC VAGINAL HYSTERECTOMY WITH SALPINGECTOMY Bilateral 06/19/2015   Procedure: LAPAROSCOPIC ASSISTED VAGINAL HYSTERECTOMY WITH SALPINGECTOMY;  Surgeon: Jaymes Graff, MD;  Location: [facility];  Service: Gynecology;  Laterality: Bilateral;   lumbar pucture     x2   SACROILIAC JOINT FUSION Left 10/28/2017   Procedure: SACROILIAC JOINT FUSION;  Surgeon: Venita Lick, MD;  Location: [facility] OR;  Service: Orthopedics;  Laterality: Left;  90 mins   TONSILLECTOMY      Family  History  Problem Relation Age of Onset   Pneumonia Mother        Sepsis   Heart attack Father              Social History:  reports that she has never smoked. She has never used smokeless tobacco. She reports current alcohol use. She reports that she does not use drugs.  Allergies  Allergen Reactions   Aspirin Other (See Comments)    Stomach pain Severe abdominal pain   Duloxetine Hcl Other (See Comments)    Blisters, "shut kidneys down"   Lorazepam Other (See Comments)    "Can't remember anything while on it" aggressive  Naproxen Nausea And Vomiting and Other (See Comments)    cramping   Penicillins Hives    Has patient had a PCN reaction causing immediate rash, facial/tongue/throat swelling, SOB or lightheadedness with hypotension: No Has patient had a PCN reaction causing severe rash involving mucus membranes or skin necrosis: No Has patient had a PCN reaction that required hospitalization No Has patient had a PCN reaction occurring within the last 10 years: No If all of the above answers are "NO", then may proceed with Cephalosporin use.   Cephalexin     Unknown reaction   Buspar [Buspirone] Other (See Comments)    "felt weird, heart raced, cloudy"   Macrobid [Nitrofurantoin Monohyd Macro] Nausea Only and Other (See Comments)    Stomach pain   Sudafed [Pseudoephedrine Hcl] Hives   Topiramate Other (See Comments)    Numbness/tingling in mouth/feet    MEDICATIONS:                                                                                                                     No current facility-administered medications on file prior to encounter.   Current Outpatient Medications on File Prior to Encounter  Medication Sig Dispense Refill   ALPRAZolam (XANAX XR) 0.5 MG 24 hr tablet Take 1 tablet by mouth daily.     cloNIDine (CATAPRES) 0.1 MG tablet Take 1 tablet (0.1 mg total) by mouth 3 (three) times daily. (Patient taking differently: Take 0.1-0.3 mg by mouth 3  (three) times daily.) 90 tablet 0   diphenhydrAMINE (BENADRYL) 25 MG tablet Take 25-50 mg by mouth daily as needed for allergies.     furosemide (LASIX) 20 MG tablet Take 10-20 mg by mouth daily as needed for edema.     hydrochlorothiazide (MICROZIDE) 12.5 MG capsule Take 12.5 mg by mouth daily as needed (for edema).     meloxicam (MOBIC) 7.5 MG tablet Take 7.5 mg by mouth 2 (two) times daily as needed.     naratriptan (AMERGE) 2.5 MG tablet Take 1 tablet (2.5 mg total) by mouth as needed for migraine. Take one (1) tablet at onset of headache; if returns or does not resolve, may repeat after 4 hours; do not exceed five (5) mg in 24 hours. 10 tablet 5   ondansetron (ZOFRAN-ODT) 8 MG disintegrating tablet 8mg  ODT q4 hours prn nausea 10 tablet 0   promethazine (PHENERGAN) 25 MG tablet Take 1 tablet (25 mg total) by mouth every 6 (six) hours as needed for nausea or vomiting. 20 tablet 1   propranolol ER (INDERAL LA) 120 MG 24 hr capsule Take 120 mg by mouth daily.     sertraline (ZOLOFT) 100 MG tablet Take 1 tablet by mouth daily.     tiZANidine (ZANAFLEX) 4 MG tablet Take 1 tablet (4 mg total) by mouth every 6 (six) hours as needed for muscle spasms. (Patient taking differently: Take 4 mg by mouth.) 30 tablet 1   Vitamin D, Ergocalciferol, (DRISDOL) 1.25 MG (50000 UNIT) CAPS capsule Take 50,000  Units by mouth 2 (two) times a week. Thursday and Sunday     zonisamide (ZONEGRAN) 100 MG capsule Take 1 capsule (100 mg total) by mouth at bedtime. 90 capsule 3   Galcanezumab-gnlm (EMGALITY) 120 MG/ML SOAJ Inject 240 mg into the skin every 30 (thirty) days. (Patient not taking: Reported on 12/31/2022) 2 mL 0   Galcanezumab-gnlm (EMGALITY) 120 MG/ML SOAJ Inject 120 mg into the skin every 30 (thirty) days. (Patient not taking: Reported on 12/31/2022) 1.12 mL 11   Rimegepant Sulfate (NURTEC) 75 MG TBDP Take 1 tablet (75 mg total) by mouth as needed. Take 1 tablet at onset of headache, max is 1 tablet in 24 hours.  (Patient not taking: Reported on 12/31/2022) 8 tablet 11      ROS:                                                                                                                                       No focal numbness except to her left upper arm region in the context of the sling. Has a rash to her medial LUE due to the sling. No motor weakness. No aphasia or dysarthria endorsed by the patient. Other ROS as per HPI.    Blood pressure 91/62, pulse 77, temperature 98.1 F (36.7 C), temperature source Oral, resp. rate 11, height 5\' 4"  (1.626 m), weight 117.9 kg, last menstrual period 06/12/2015, SpO2 100%.   General Examination:                                                                                                       Physical Exam  General: Morbidly obese HEENT-  [place]/AT    Lungs- Respirations unlabored Extremities- No edema  Neurological Examination Mental Status: Alert, oriented x 5, thought content appropriate.  Speech fluent without evidence of aphasia.  Able to follow all commands without difficulty. Cranial Nerves: II: Slight constriction of the inferior quadrants of her nasal visual fields OU.  PERRL  III,IV, VI: No ptosis. EOMI.  V: Temp sensation equal bilaterally  VII: Smile symmetric VIII: Hearing intact to voice IX,X: No hoarseness XI: Symmetric shoulder shrug XII: Midline tongue extension Motor: BUE 5/5 proximally and distally BLE 5/5 proximally and distally  Sensory: Temp and light touch intact throughout, bilaterally. No extinction to DSS.  Deep Tendon Reflexes: 1-2+ and symmetric throughout Cerebellar: No ataxia with FNF bilaterally  Gait: Deferred     Lab Results: Basic Metabolic Panel: Recent Labs  Lab 12/31/22 0002  NA 137  K 4.6  CL 105  CO2 21*  GLUCOSE 146*  BUN 12  CREATININE 0.94  CALCIUM 9.4    CBC: Recent Labs  Lab 12/31/22 0002  WBC 15.8*  HGB 15.2*  HCT 45.5  MCV 94.8  PLT 504*    Cardiac Enzymes: No results for  input(s): "CKTOTAL", "CKMB", "CKMBINDEX", "TROPONINI" in the last 168 hours.  Lipid Panel: No results for input(s): "CHOL", "TRIG", "HDL", "CHOLHDL", "VLDL", "LDLCALC" in the last 168 hours.  Imaging: DG Chest 2 View  Result Date: 12/31/2022 CLINICAL DATA:  42 year old female with history of edema on neck CT. EXAM: CHEST - 2 VIEW COMPARISON:  Chest x-ray 10/22/2007. FINDINGS: Lung volumes are low. No consolidative airspace disease. No pleural effusions. No pneumothorax. No pulmonary nodule or mass noted. Pulmonary vasculature and the cardiomediastinal silhouette are within normal limits. IMPRESSION: Low lung volumes without radiographic evidence of acute cardiopulmonary disease. Electronically Signed   By: Trudie Reed M.D.   On: 12/31/2022 06:02   MR BRAIN WO CONTRAST  Result Date: 12/31/2022 CLINICAL DATA:  Headache and high blood pressure. Patient did not take blood pressure medication today EXAM: MRI HEAD WITHOUT CONTRAST TECHNIQUE: Multiplanar, multiecho pulse sequences of the brain and surrounding structures were obtained without intravenous contrast. COMPARISON:  CTA of the head neck from earlier today FINDINGS: Brain: Patchy T2 hyperintensity along the cortical and subcortical regions of the bilateral posterior frontal, parietal, and especially occipital lobes without restricted diffusion or hemorrhage. No hydrocephalus or masslike finding. Vascular: Major flow voids are preserved. Patent dural sinuses on prior CTA. Skull and upper cervical spine: Normal marrow signal. Sinuses/Orbits: Negative. IMPRESSION: Patchy edematous appearance along the superior and posterior aspect of the cerebral hemispheres, consistent with posterior reversible encephalopathy syndrome. No complicating hemorrhage or completed infarct. Electronically Signed   By: Tiburcio Pea M.D.   On: 12/31/2022 05:01   CT ANGIO HEAD NECK W WO CM  Result Date: 12/31/2022 CLINICAL DATA:  Headache and dizziness EXAM: CT  ANGIOGRAPHY HEAD AND NECK WITH AND WITHOUT CONTRAST TECHNIQUE: Multidetector CT imaging of the head and neck was performed using the standard protocol during bolus administration of intravenous contrast. Multiplanar CT image reconstructions and MIPs were obtained to evaluate the vascular anatomy. Carotid stenosis measurements (when applicable) are obtained utilizing NASCET criteria, using the distal internal carotid diameter as the denominator. RADIATION DOSE REDUCTION: This exam was performed according to the departmental dose-optimization program which includes automated exposure control, adjustment of the mA and/or kV according to patient size and/or use of iterative reconstruction technique. CONTRAST:  70mL OMNIPAQUE IOHEXOL 350 MG/ML SOLN COMPARISON:  None Available. FINDINGS: CT HEAD FINDINGS Brain: No mass, hemorrhage or extra-axial collection. CSF spaces are normal. Normal appearance of the brain parenchyma. Skull: The visualized skull base, calvarium and extracranial soft tissues are normal. Sinuses/Orbits: No fluid levels or advanced mucosal thickening of the visualized paranasal sinuses. No mastoid or middle ear effusion. Normal orbits. CTA NECK FINDINGS SKELETON: No acute abnormality or high grade bony spinal canal stenosis. OTHER NECK: Normal pharynx, larynx and major salivary glands. No cervical lymphadenopathy. Unremarkable thyroid gland. UPPER CHEST: Small pleural effusions and mild pulmonary edema. AORTIC ARCH: There is no calcific atherosclerosis of the aortic arch. Normal variant aortic arch branching pattern with the brachiocephalic and left common carotid arteries sharing a common origin. RIGHT CAROTID SYSTEM: Normal without aneurysm, dissection or stenosis. LEFT CAROTID SYSTEM: Normal without aneurysm, dissection or stenosis. VERTEBRAL ARTERIES: Left dominant configuration. There is no dissection, occlusion  or flow-limiting stenosis to the skull base (V1-V3 segments). CTA HEAD FINDINGS  POSTERIOR CIRCULATION: --Vertebral arteries: Normal V4 segments. --Inferior cerebellar arteries: Normal. --Basilar artery: Normal. --Superior cerebellar arteries: Normal. --Posterior cerebral arteries (PCA): Normal. ANTERIOR CIRCULATION: --Intracranial internal carotid arteries: Normal. --Anterior cerebral arteries (ACA): Normal. --Middle cerebral arteries (MCA): Normal. VENOUS SINUSES: As permitted by contrast timing, patent. ANATOMIC VARIANTS: Fetal origin of the right posterior cerebral artery. Review of the MIP images confirms the above findings. IMPRESSION: 1. No emergent large vessel occlusion or high-grade stenosis of the intracranial arteries. 2. Small pleural effusions and mild pulmonary edema. Electronically Signed   By: Deatra Robinson M.D.   On: 12/31/2022 02:50     Assessment: 42 year old female presenting with severe headache, visual blurring and confusion. Severe HTN was noted on arrival. MRI brain reveals findings most consistent with PRES.  - Exam reveals no focal deficits other than slight constriction of the inferior quadrants of her nasal visual fields OU.  - CTA of head and neck: No emergent large vessel occlusion or high-grade stenosis of the intracranial or extracranial arteries. Small pleural effusions and mild pulmonary edema. - MRI brain without contrast:  Patchy edematous appearance along the superior and posterior aspects of the cerebral hemispheres, appearing most consistent with posterior reversible encephalopathy syndrome. No complicating hemorrhage or completed infarct.   - Given her amnesia for the fall last week with left shoulder dislocation, possible seizure occurring at that time is a consideration. In her age group, a fall at home spontaneously with the degree of trauma she sustained would be unusual. However, a seizure could result in a sudden fall and/or limb jerking of sufficient force to result in a shoulder dislocation.  - Her most recent BP at a doctor's visit about  3 months ago is felt by patient most likely to have been normal "because they did not say anything about it".  - History of sleeve gastrectomy for weight loss to manage pseudotumor cerebri. Also has a history of migraines.  - Currently with a 7/10 occipital and frontal headache, worsened with coughing. Most likely secondary to PRES rather than her pseudotumor or a migraine, as patient states the current headache is atypical.   Recommendations: - BP management with SBP goal of < 160 - Wash creases and skin on skin contacts in left arm/thorax region then apply an antifungal ointment - Seizure precautions.  - EEG (ordered) - I have recommended that she start to keep a home BP diary - PRN medications for headache pain.  - Orthopedics consult and left shoulder MRI   Electronically signed: Dr. Caryl Pina 12/31/2022, 8:02 AM

## 2022-12-31 NOTE — Progress Notes (Signed)
EEG complete - results pending 

## 2022-12-31 NOTE — ED Notes (Addendum)
Patient transported to x-ray. ?

## 2022-12-31 NOTE — ED Notes (Signed)
CT called this RN to come place a larger bore IV for CT angio as pt only has a 22g, needs 20g or higher. Pt is a very difficult stick.

## 2022-12-31 NOTE — ED Notes (Signed)
Patient transported to MRI 

## 2022-12-31 NOTE — ED Notes (Signed)
Patient transported to CT 

## 2022-12-31 NOTE — ED Notes (Signed)
Pt arrived to treatment RM 31 at this time via wheelchair brought back NT Ashlyn, from WR after triage and being MSE by Dr. Durwin Nora , pt's husband is very upset as pt was sent to WR, pt's husband is being rude and disrespectful shouting out from the treatment room towards the nurses station, Diplomatic Services operational officer has security on standby.  Pt's husband assume pt was in RM 31, (security let pt's husband back) as pt has been assigned to RM 31 since 2353, while she was actually in in triage 2, then was placed in WR after MSE and labs obtain, however pt did not arrive to RM until 0024

## 2022-12-31 NOTE — Progress Notes (Signed)
16:05H Received patient from ED via bed. Alert and oriented to the room and room set-up

## 2023-01-01 ENCOUNTER — Inpatient Hospital Stay (HOSPITAL_COMMUNITY): Payer: Commercial Managed Care - HMO

## 2023-01-01 DIAGNOSIS — I5031 Acute diastolic (congestive) heart failure: Secondary | ICD-10-CM | POA: Diagnosis not present

## 2023-01-01 DIAGNOSIS — I6783 Posterior reversible encephalopathy syndrome: Secondary | ICD-10-CM | POA: Diagnosis not present

## 2023-01-01 LAB — CBC
HCT: 36.6 % (ref 36.0–46.0)
Hemoglobin: 12.5 g/dL (ref 12.0–15.0)
MCH: 32.5 pg (ref 26.0–34.0)
MCHC: 34.2 g/dL (ref 30.0–36.0)
MCV: 95.1 fL (ref 80.0–100.0)
Platelets: 324 10*3/uL (ref 150–400)
RBC: 3.85 MIL/uL — ABNORMAL LOW (ref 3.87–5.11)
RDW: 13.2 % (ref 11.5–15.5)
WBC: 9.3 10*3/uL (ref 4.0–10.5)
nRBC: 0.2 % (ref 0.0–0.2)

## 2023-01-01 LAB — ECHOCARDIOGRAM COMPLETE
AR max vel: 2.33 cm2
AV Area VTI: 2.52 cm2
AV Area mean vel: 2.21 cm2
AV Mean grad: 4 mm[Hg]
AV Peak grad: 7.2 mm[Hg]
Ao pk vel: 1.34 m/s
Area-P 1/2: 4.68 cm2
Height: 64 in
S' Lateral: 2.6 cm
Weight: 4292.8 [oz_av]

## 2023-01-01 LAB — BASIC METABOLIC PANEL
Anion gap: 11 (ref 5–15)
BUN: 13 mg/dL (ref 6–20)
CO2: 21 mmol/L — ABNORMAL LOW (ref 22–32)
Calcium: 8.9 mg/dL (ref 8.9–10.3)
Chloride: 107 mmol/L (ref 98–111)
Creatinine, Ser: 1 mg/dL (ref 0.44–1.00)
GFR, Estimated: >60 mL/min (ref >60)
Glucose, Bld: 90 mg/dL (ref 70–99)
Potassium: 3.8 mmol/L (ref 3.5–5.1)
Sodium: 139 mmol/L (ref 135–145)

## 2023-01-01 MED ORDER — ALPRAZOLAM 0.5 MG PO TABS
0.5000 mg | ORAL_TABLET | Freq: Once | ORAL | Status: AC
  Administered 2023-01-01: 0.5 mg via ORAL
  Filled 2023-01-01: qty 1

## 2023-01-01 MED ORDER — MAGNESIUM SULFATE 2 GM/50ML IV SOLN
2.0000 g | Freq: Once | INTRAVENOUS | Status: DC
  Filled 2023-01-01: qty 50

## 2023-01-01 MED ORDER — PROPRANOLOL HCL ER 60 MG PO CP24
120.0000 mg | ORAL_CAPSULE | Freq: Every day | ORAL | Status: DC
  Administered 2023-01-01 – 2023-01-02 (×2): 120 mg via ORAL
  Filled 2023-01-01: qty 1
  Filled 2023-01-01 (×2): qty 2

## 2023-01-01 MED ORDER — POTASSIUM CHLORIDE CRYS ER 20 MEQ PO TBCR
40.0000 meq | EXTENDED_RELEASE_TABLET | Freq: Once | ORAL | Status: AC
  Administered 2023-01-01: 40 meq via ORAL
  Filled 2023-01-01: qty 2

## 2023-01-01 MED ORDER — CLONIDINE HCL 0.1 MG PO TABS
0.1000 mg | ORAL_TABLET | Freq: Three times a day (TID) | ORAL | Status: DC
  Administered 2023-01-01 – 2023-01-02 (×3): 0.1 mg via ORAL
  Filled 2023-01-01 (×3): qty 1

## 2023-01-01 MED ORDER — ALPRAZOLAM 0.25 MG PO TABS
0.2500 mg | ORAL_TABLET | Freq: Two times a day (BID) | ORAL | Status: DC
  Administered 2023-01-01 – 2023-01-02 (×2): 0.25 mg via ORAL
  Filled 2023-01-01 (×2): qty 1

## 2023-01-01 MED ORDER — DIPHENHYDRAMINE HCL 50 MG/ML IJ SOLN
25.0000 mg | Freq: Every evening | INTRAMUSCULAR | Status: DC | PRN
  Administered 2023-01-02: 25 mg via INTRAVENOUS
  Filled 2023-01-01: qty 1

## 2023-01-01 MED ORDER — PERFLUTREN LIPID MICROSPHERE
1.0000 mL | INTRAVENOUS | Status: AC | PRN
  Administered 2023-01-01: 2 mL via INTRAVENOUS

## 2023-01-01 MED ORDER — ALPRAZOLAM ER 0.5 MG PO TB24: 0.5000 mg | ORAL_TABLET | Freq: Every day | ORAL | Status: DC

## 2023-01-01 MED ORDER — MAGNESIUM OXIDE -MG SUPPLEMENT 400 (240 MG) MG PO TABS
400.0000 mg | ORAL_TABLET | Freq: Two times a day (BID) | ORAL | Status: AC
  Administered 2023-01-01 – 2023-01-02 (×3): 400 mg via ORAL
  Filled 2023-01-01 (×3): qty 1

## 2023-01-01 NOTE — Evaluation (Signed)
Occupational Therapy Evaluation Patient Details Name: [name] MRN: [id number] DOB: 13-Sep-1980 Today's Date: 01/01/2023   History of Present Illness 42 y.o. female presents 10/29 with acute onset of visual blurring, severe headache and malignant HTN. MRI brain reveals findings most consistent with PRES. L shoulder MRI demonstrated partial-thickness tears of anterior infraspinatus tendinosis and supraspinatus tendon. PMHx:  hypertension, depression, GERD, pseudotumor cerebri, migraine headaches, ADHD   Clinical Impression   [name] was evaluated s/p the above admission list. She is indep and lives with family at baseline. Upon evaluation the pt was limited by significant L shoulder pain, decreased activity tolerance and dizziness with mobility. Overall she was mod I for transfers and generalized superivsion A for mobility for safety only. Due to the deficits listed below the pt also needs up to mod A for all ADLs due to L shoulder pain with any active movement. Pt has sling in room with no active WBing or ROM orders. Pt will benefit from continued acute OT services and OP OT for L shoulder management.        If plan is discharge home, recommend the following: A little help with bathing/dressing/bathroom;Assistance with cooking/housework;Assist for transportation;Help with stairs or ramp for entrance;Direct supervision/assist for medications management    Functional Status Assessment  Patient has had a recent decline in their functional status and demonstrates the ability to make significant improvements in function in a reasonable and predictable amount of time.  Equipment Recommendations  None recommended by OT       Precautions / Restrictions Precautions Precautions: Fall Required Braces or Orthoses: Sling Restrictions Weight Bearing Restrictions: No      Mobility Bed Mobility Overal bed mobility: Modified Independent                  Transfers Overall transfer  level: Modified independent Equipment used: None               General transfer comment: mod I for simple transers, superivsion A fro mobility due to report of dizziness/light headedness with mobility      Balance Overall balance assessment: No apparent balance deficits (not formally assessed)           ADL either performed or assessed with clinical judgement   ADL Overall ADL's : Needs assistance/impaired Eating/Feeding: Set up;Sitting   Grooming: Set up;Sitting   Upper Body Bathing: Moderate assistance;Sitting   Lower Body Bathing: Moderate assistance;Sit to/from stand   Upper Body Dressing : Moderate assistance   Lower Body Dressing: Moderate assistance   Toilet Transfer: Contact guard assist   Toileting- Clothing Manipulation and Hygiene: Supervision/safety;Sitting/lateral lean       Functional mobility during ADLs: Supervision/safety;Cueing for safety;Cueing for sequencing General ADL Comments: assist due to lack of use of LUE     Vision Baseline Vision/History: 1 Wears glasses Vision Assessment?: No apparent visual deficits     Perception Perception: Within Functional Limits       Praxis Praxis: WFL       Pertinent Vitals/Pain Pain Assessment Pain Assessment: Faces Faces Pain Scale: Hurts even more Pain Location: L shoulder Pain Descriptors / Indicators: Aching Pain Intervention(s): Limited activity within patient's tolerance, Monitored during session     Extremity/Trunk Assessment Upper Extremity Assessment Upper Extremity Assessment: RUE deficits/detail;LUE deficits/detail;Right hand dominant RUE Deficits / Details: WFL LUE Deficits / Details: MRI shows partial tendon tears, all active movement at shoulder is extremely painful. Elbow wrist and hand are [facility]. Pt has sling but reports it is more  comfortable to rest out of the sling - no formal orders written LUE Sensation: WNL LUE Coordination: decreased fine motor;decreased gross motor    Lower Extremity Assessment Lower Extremity Assessment: Defer to PT evaluation   Cervical / Trunk Assessment Cervical / Trunk Assessment: Normal   Communication Communication Communication: No apparent difficulties   Cognition Arousal: Alert Behavior During Therapy: WFL for tasks assessed/performed Overall Cognitive Status: Within Functional Limits for tasks assessed                                 General Comments: anxious about pain management of L shoulder, reports she has hx of pain medication abuse and not goes to a pain clinic                Home Living Family/patient expects to be discharged to:: Private residence Living Arrangements: Spouse/significant other;Children Available Help at Discharge: Family;Available 24 hours/day Type of Home: House Home Access: Stairs to enter [employer] of Steps: 2.5 Entrance Stairs-Rails: Left Home Layout: One level     Bathroom Shower/Tub: Chief Strategy Officer: Handicapped height Bathroom Accessibility: Yes   Home Equipment: None          Prior Functioning/Environment Prior Level of Function : Independent/Modified Independent;History of Falls (last six months)             Mobility Comments: ind ADLs Comments: ind, drives, stay at home mom        OT Problem List: Decreased activity tolerance;Impaired UE functional use      OT Treatment/Interventions: Self-care/ADL training;DME and/or AE instruction;Therapeutic activities;Patient/family education    OT Goals(Current goals can be found in the care plan section) Acute Rehab OT Goals Patient Stated Goal: less pain OT Goal Formulation: With patient Time For Goal Achievement: 01/15/23 Potential to Achieve Goals: Good ADL Goals Pt Will Perform Grooming: with modified independence;sitting Pt Will Perform Upper Body Dressing: with modified independence;sitting Pt Will Perform Lower Body Dressing: with min assist;sit to/from  stand Pt Will Transfer to Toilet: Independently  OT Frequency: Min 1X/week       AM-PAC OT "6 Clicks" Daily Activity     Outcome Measure Help from another person eating meals?: A Little Help from another person taking care of personal grooming?: A Little Help from another person toileting, which includes using toliet, bedpan, or urinal?: A Little Help from another person bathing (including washing, rinsing, drying)?: A Lot Help from another person to put on and taking off regular upper body clothing?: A Lot Help from another person to put on and taking off regular lower body clothing?: A Lot 6 Click Score: 15   End of Session Nurse Communication: Mobility status  Activity Tolerance: Patient tolerated treatment well Patient left: in bed;with call bell/phone within reach  OT Visit Diagnosis: Unsteadiness on feet (R26.81);Muscle weakness (generalized) (M62.81);Pain                Time: 0981-1914 OT Time Calculation (min): 24 min Charges:  OT General Charges $OT Visit: 1 Visit OT Evaluation $OT Eval Moderate Complexity: 1 Mod OT Treatments $Self Care/Home Management : 8-22 mins  Derenda Mis, OTR/L Acute Rehabilitation Services Office [phone] Secure Chat Communication Preferred   Donia Pounds 01/01/2023, 11:29 AM

## 2023-01-01 NOTE — Progress Notes (Signed)
Echocardiogram 2D Echocardiogram has been performed.  [name] 01/01/2023, 8:54 AM

## 2023-01-01 NOTE — Consult Note (Signed)
Reason for Consult:Left shoulder injury Referring Physician: Melina Schools Dahal Time called: 9147 Time at bedside: 1119   [name] is an 42 y.o. female.  HPI: Doe fell and dislocated her left shoulder October 22nd. She was due to see Dr. Ranell Patrick in f/u but was admitted yesterday with confusion. An MRI was done that showed expected cuff tears and orthopedic surgery was consulted to see pt. She is RHD.  Past Medical History:  Diagnosis Date   ADHD (attention deficit hyperactivity disorder)    ADHD, adult residual type    Anxiety    Depression    Family history of adverse reaction to anesthesia    sister has PONV   GERD (gastroesophageal reflux disease)    Headache    otc med prn - last one 2 months ago   New onset of congestive heart failure (HCC) 12/31/2022   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   Pseudotumor cerebri     Past Surgical History:  Procedure Laterality Date   ABDOMINAL HYSTERECTOMY     CESAREAN SECTION     x 2   CYSTOSCOPY N/A 06/19/2015   Procedure: CYSTOSCOPY;  Surgeon: Jaymes Graff, MD;  Location: [facility];  Service: Gynecology;  Laterality: N/A;   GASTRECTOMY     sleeve, [facility]   IR GENERIC HISTORICAL  05/12/2016   IR FLUORO GUIDED NEEDLE PLC ASPIRATION/INJECTION LOC 05/12/2016 Paulina Fusi, MD [facility]   LAPAROSCOPIC VAGINAL HYSTERECTOMY WITH SALPINGECTOMY Bilateral 06/19/2015   Procedure: LAPAROSCOPIC ASSISTED VAGINAL HYSTERECTOMY WITH SALPINGECTOMY;  Surgeon: Jaymes Graff, MD;  Location: [facility];  Service: Gynecology;  Laterality: Bilateral;   lumbar pucture     x2   SACROILIAC JOINT FUSION Left 10/28/2017   Procedure: SACROILIAC JOINT FUSION;  Surgeon: Venita Lick, MD;  Location: [facility];  Service: Orthopedics;  Laterality: Left;  90 mins   TONSILLECTOMY      Family History  Problem Relation Age of Onset   Pneumonia Mother        Sepsis   Heart attack Father     Social History:  reports that she has never smoked. She has never  used smokeless tobacco. She reports current alcohol use. She reports that she does not use drugs.  Allergies:  Allergies  Allergen Reactions   Aspirin Other (See Comments)    Stomach pain Severe abdominal pain   Duloxetine Hcl Other (See Comments)    Blisters, "shut kidneys down"   Lorazepam Other (See Comments)    "Can't remember anything while on it" aggressive    Naproxen Nausea And Vomiting and Other (See Comments)    cramping   Penicillins Hives    Has patient had a PCN reaction causing immediate rash, facial/tongue/throat swelling, SOB or lightheadedness with hypotension: No Has patient had a PCN reaction causing severe rash involving mucus membranes or skin necrosis: No Has patient had a PCN reaction that required hospitalization No Has patient had a PCN reaction occurring within the last 10 years: No If all of the above answers are "NO", then may proceed with Cephalosporin use.   Cephalexin     Unknown reaction   Buspar [Buspirone] Other (See Comments)    "felt weird, heart raced, cloudy"   Macrobid [Nitrofurantoin Monohyd Macro] Nausea Only and Other (See Comments)    Stomach pain   Sudafed [Pseudoephedrine Hcl] Hives   Topiramate Other (See Comments)    Numbness/tingling in mouth/feet    Medications: I have reviewed the patient's current medications.  Results for orders placed  or performed during the hospital encounter of 12/30/22 (from the past 48 hour(s))  Comprehensive metabolic panel     Status: Abnormal   Collection Time: 12/31/22 12:02 AM  Result Value Ref Range   Sodium 137 135 - 145 mmol/L   Potassium 4.6 3.5 - 5.1 mmol/L   Chloride 105 98 - 111 mmol/L   CO2 21 (L) 22 - 32 mmol/L   Glucose, Bld 146 (H) 70 - 99 mg/dL    Comment: Glucose reference range applies only to samples taken after fasting for at least 8 hours.   BUN 12 6 - 20 mg/dL   Creatinine, Ser 8.29 0.44 - 1.00 mg/dL   Calcium 9.4 8.9 - 56.2 mg/dL   Total Protein 7.3 6.5 - 8.1 g/dL    Albumin 3.7 3.5 - 5.0 g/dL   AST 28 15 - 41 U/L   ALT 41 0 - 44 U/L   Alkaline Phosphatase 98 38 - 126 U/L   Total Bilirubin 0.6 0.3 - 1.2 mg/dL   GFR, Estimated >13 >08 mL/min    Comment: (NOTE) Calculated using the CKD-EPI Creatinine Equation (2021)    Anion gap 11 5 - 15    Comment: Performed at [facility], [address] [place], [place] [address]  CBC     Status: Abnormal   Collection Time: 12/31/22 12:02 AM  Result Value Ref Range   WBC 15.8 (H) 4.0 - 10.5 K/uL   RBC 4.80 3.87 - 5.11 MIL/uL   Hemoglobin 15.2 (H) 12.0 - 15.0 g/dL   HCT 69.6 29.5 - 28.4 %   MCV 94.8 80.0 - 100.0 fL   MCH 31.7 26.0 - 34.0 pg   MCHC 33.4 30.0 - 36.0 g/dL   RDW 13.2 44.0 - 10.2 %   Platelets 504 (H) 150 - 400 K/uL   nRBC 0.0 0.0 - 0.2 %    Comment: Performed at [facility], [address], [place], [place] [address]  Lipase, blood     Status: None   Collection Time: 12/31/22 12:02 AM  Result Value Ref Range   Lipase 21 11 - 51 U/L    Comment: Performed at [facility], [address], [place], [place] [address]  Troponin I (High Sensitivity)     Status: Abnormal   Collection Time: 12/31/22 12:02 AM  Result Value Ref Range   Troponin I (High Sensitivity) 65 (H) <18 ng/L    Comment: (NOTE) Elevated high sensitivity troponin I (hsTnI) values and significant  changes across serial measurements may suggest ACS but many other  chronic and acute conditions are known to elevate hsTnI results.  Refer to the "Links" section for chest pain algorithms and additional  guidance. Performed at [facility], [address], [place], [place] [address]   Ethanol     Status: None   Collection Time: 12/31/22  1:10 AM  Result Value Ref Range   Alcohol, Ethyl (B) <10 <10 mg/dL    Comment: (NOTE) Lowest detectable limit for serum alcohol is 10 mg/dL.  For medical purposes only. Performed at [facility], [address], [place], [place] [address]   Acetaminophen level      Status: Abnormal   Collection Time: 12/31/22  1:10 AM  Result Value Ref Range   Acetaminophen (Tylenol), Serum <10 (L) 10 - 30 ug/mL    Comment: (NOTE) Therapeutic concentrations vary significantly. A range of 10-30 ug/mL  may be an effective concentration for many  patients. However, some  are best treated at concentrations outside of this range. Acetaminophen concentrations >150 ug/mL at 4 hours after ingestion  and >50 ug/mL at 12 hours after ingestion are often associated with  toxic reactions.  Performed at [facility], [address] [place], [place] [address]   Salicylate level     Status: Abnormal   Collection Time: 12/31/22  1:10 AM  Result Value Ref Range   Salicylate Lvl <7.0 (L) 7.0 - 30.0 mg/dL    Comment: Performed at [facility], [address] [place], [place] [address]  hCG, serum, qualitative     Status: None   Collection Time: 12/31/22  2:14 AM  Result Value Ref Range   Preg, Serum NEGATIVE NEGATIVE    Comment:        THE SENSITIVITY OF THIS METHODOLOGY IS >10 mIU/mL. Performed at [facility], [address], [place], [place] [address]   Rapid urine drug screen (hospital performed)     Status: Abnormal   Collection Time: 12/31/22  3:02 AM  Result Value Ref Range   Opiates NONE DETECTED NONE DETECTED   Cocaine NONE DETECTED NONE DETECTED   Benzodiazepines POSITIVE (A) NONE DETECTED   Amphetamines NONE DETECTED NONE DETECTED   Tetrahydrocannabinol NONE DETECTED NONE DETECTED   Barbiturates NONE DETECTED NONE DETECTED    Comment: (NOTE) DRUG SCREEN FOR MEDICAL PURPOSES ONLY.  IF CONFIRMATION IS NEEDED FOR ANY PURPOSE, NOTIFY LAB WITHIN 5 DAYS.  LOWEST DETECTABLE LIMITS FOR URINE DRUG SCREEN Drug Class                     Cutoff (ng/mL) Amphetamine and metabolites    1000 Barbiturate and metabolites    200 Benzodiazepine                 200 Opiates and metabolites        300 Cocaine and metabolites        300 THC                             50 Performed at [facility], [address], [place], [place] [address]   Troponin I (High Sensitivity)     Status: Abnormal   Collection Time: 12/31/22  3:53 AM  Result Value Ref Range   Troponin I (High Sensitivity) 67 (H) <18 ng/L    Comment: (NOTE) Elevated high sensitivity troponin I (hsTnI) values and significant  changes across serial measurements may suggest ACS but many other  chronic and acute conditions are known to elevate hsTnI results.  Refer to the "Links" section for chest pain algorithms and additional  guidance. Performed at [facility], [address], [place], [place] [address]   Brain natriuretic peptide     Status: Abnormal   Collection Time: 12/31/22  3:53 AM  Result Value Ref Range   B Natriuretic Peptide 1,141.4 (H) 0.0 - 100.0 pg/mL    Comment: Performed at [facility], [address] [place], [place] [address]  HIV Antibody (routine testing w rflx)     Status: None   Collection Time: 12/31/22  8:56 AM  Result Value Ref Range   HIV Screen 4th Generation wRfx Non Reactive Non Reactive    Comment: Performed at [facility], [address], [place], [place] [address]  Procalcitonin     Status: None   Collection  Time: 12/31/22  8:56 AM  Result Value Ref Range   Procalcitonin <0.10 ng/mL    Comment:        Interpretation: PCT (Procalcitonin) <= 0.5 ng/mL: Systemic infection (sepsis) is not likely. Local bacterial infection is possible. (NOTE)       Sepsis PCT Algorithm           Lower Respiratory Tract                                      Infection PCT Algorithm    ----------------------------     ----------------------------         PCT < 0.25 ng/mL                PCT < 0.10 ng/mL          Strongly encourage             Strongly discourage   discontinuation of antibiotics    initiation of antibiotics    ----------------------------     -----------------------------       PCT 0.25 - 0.50 ng/mL            PCT  0.10 - 0.25 ng/mL               OR       >80% decrease in PCT            Discourage initiation of                                            antibiotics      Encourage discontinuation           of antibiotics    ----------------------------     -----------------------------         PCT >= 0.50 ng/mL              PCT 0.26 - 0.50 ng/mL               AND        <80% decrease in PCT             Encourage initiation of                                             antibiotics       Encourage continuation           of antibiotics    ----------------------------     -----------------------------        PCT >= 0.50 ng/mL                  PCT > 0.50 ng/mL               AND         increase in PCT                  Strongly encourage                                      initiation of antibiotics    Strongly encourage escalation  of antibiotics                                     -----------------------------                                           PCT <= 0.25 ng/mL                                                 OR                                        > 80% decrease in PCT                                      Discontinue / Do not initiate                                             antibiotics  Performed at [facility], [address] [place], [place] [address]   Magnesium     Status: None   Collection Time: 12/31/22  8:56 AM  Result Value Ref Range   Magnesium 1.7 1.7 - 2.4 mg/dL    Comment: Performed at [facility], [address], [place], [place] [address]  TSH     Status: None   Collection Time: 12/31/22  8:56 AM  Result Value Ref Range   TSH 2.744 0.350 - 4.500 uIU/mL    Comment: Performed by a 3rd Generation assay with a functional sensitivity of <=0.01 uIU/mL. Performed at [facility], [address], [place], [place] [address]   Hemoglobin A1c     Status: None   Collection Time: 12/31/22  9:00 AM  Result Value Ref Range   Hgb A1c MFr Bld  5.4 4.8 - 5.6 %    Comment: (NOTE) Pre diabetes:          5.7%-6.4%  Diabetes:              >6.4%  Glycemic control for   <7.0% adults with diabetes    Mean Plasma Glucose 108.28 mg/dL    Comment: Performed at [facility], [address] [place], [place] [address]  CBC with Differential/Platelet     Status: Abnormal   Collection Time: 12/31/22  9:00 AM  Result Value Ref Range   WBC 11.9 (H) 4.0 - 10.5 K/uL   RBC 3.89 3.87 - 5.11 MIL/uL   Hemoglobin 12.4 12.0 - 15.0 g/dL   HCT 29.5 62.1 - 30.8 %   MCV 97.4 80.0 - 100.0 fL   MCH 31.9 26.0 - 34.0 pg   MCHC 32.7 30.0 - 36.0 g/dL   RDW 65.7 84.6 - 96.2 %   Platelets 410 (H) 150 - 400 K/uL   nRBC 0.0 0.0 - 0.2 %   Neutrophils Relative % 74 %  Neutro Abs 8.8 (H) 1.7 - 7.7 K/uL   Lymphocytes Relative 19 %   Lymphs Abs 2.3 0.7 - 4.0 K/uL   Monocytes Relative 7 %   Monocytes Absolute 0.8 0.1 - 1.0 K/uL   Eosinophils Relative 0 %   Eosinophils Absolute 0.0 0.0 - 0.5 K/uL   Basophils Relative 0 %   Basophils Absolute 0.0 0.0 - 0.1 K/uL   Immature Granulocytes 0 %   Abs Immature Granulocytes 0.05 0.00 - 0.07 K/uL    Comment: Performed at [facility], [address], [place], [place] [address]  CBC     Status: Abnormal   Collection Time: 01/01/23  7:07 AM  Result Value Ref Range   WBC 9.3 4.0 - 10.5 K/uL   RBC 3.85 (L) 3.87 - 5.11 MIL/uL   Hemoglobin 12.5 12.0 - 15.0 g/dL   HCT 82.9 56.2 - 13.0 %   MCV 95.1 80.0 - 100.0 fL   MCH 32.5 26.0 - 34.0 pg   MCHC 34.2 30.0 - 36.0 g/dL   RDW 86.5 78.4 - 69.6 %   Platelets 324 150 - 400 K/uL   nRBC 0.2 0.0 - 0.2 %    Comment: Performed at [facility], [address] [place], [place] [address]  Basic metabolic panel     Status: Abnormal   Collection Time: 01/01/23  7:07 AM  Result Value Ref Range   Sodium 139 135 - 145 mmol/L   Potassium 3.8 3.5 - 5.1 mmol/L   Chloride 107 98 - 111 mmol/L   CO2 21 (L) 22 - 32 mmol/L   Glucose, Bld 90 70 - 99 mg/dL    Comment:  Glucose reference range applies only to samples taken after fasting for at least 8 hours.   BUN 13 6 - 20 mg/dL   Creatinine, Ser 4.13 0.44 - 1.00 mg/dL   Calcium 8.9 8.9 - 24.4 mg/dL   GFR, Estimated >01 >02 mL/min    Comment: (NOTE) Calculated using the CKD-EPI Creatinine Equation (2021)    Anion gap 11 5 - 15    Comment: Performed at [facility], [address] [place], [place] [address]    ECHOCARDIOGRAM COMPLETE  Result Date: 01/01/2023    ECHOCARDIOGRAM REPORT   Patient Name:   [name] Date of Exam: 01/01/2023 Medical Rec #:  [id number]          Height:       64.0 in Accession #:    [id number]         Weight:       268.3 lb Date of Birth:  October 27, 1980          BSA:          2.217 m Patient Age:    42 years           BP:           145/90 mmHg Patient Gender: F                  HR:           77 bpm. Exam Location:  Inpatient Procedure: 2D Echo, Cardiac Doppler, Color Doppler and Intracardiac            Opacification Agent Indications:    CHF- Acute Diastolic  History:        Patient has no prior history of Echocardiogram examinations.  Sonographer:    Karma Ganja Referring Phys: Madelyn Flavors, A  Sonographer Comments: Technically difficult study due to poor echo windows and patient is obese. IMPRESSIONS  1. Left ventricular ejection fraction, by estimation, is 60 to 65%. The left ventricle has normal function. The left ventricle has no regional wall motion abnormalities. There is mild left ventricular hypertrophy. Left ventricular diastolic parameters are consistent with Grade I diastolic dysfunction (impaired relaxation).  2. Right ventricular systolic function is normal. The right ventricular size is normal. Tricuspid regurgitation signal is inadequate for assessing PA pressure.  3. The mitral valve is normal in structure. No evidence of mitral valve regurgitation.  4. The aortic valve was not well visualized. Aortic valve regurgitation is not visualized. Comparison(s): No  prior Echocardiogram. FINDINGS  Left Ventricle: Left ventricular ejection fraction, by estimation, is 60 to 65%. The left ventricle has normal function. The left ventricle has no regional wall motion abnormalities. Definity contrast agent was given IV to delineate the left ventricular  endocardial borders. The left ventricular internal cavity size was normal in size. There is mild left ventricular hypertrophy. Left ventricular diastolic parameters are consistent with Grade I diastolic dysfunction (impaired relaxation). Right Ventricle: The right ventricular size is normal. Right ventricular systolic function is normal. Tricuspid regurgitation signal is inadequate for assessing PA pressure. Left Atrium: Left atrial size was normal in size. Right Atrium: Right atrial size was normal in size. Pericardium: There is no evidence of pericardial effusion. Mitral Valve: The mitral valve is normal in structure. No evidence of mitral valve regurgitation. Tricuspid Valve: Tricuspid valve regurgitation is not demonstrated. Aortic Valve: The aortic valve was not well visualized. Aortic valve regurgitation is not visualized. Aortic valve mean gradient measures 4.0 mmHg. Aortic valve peak gradient measures 7.2 mmHg. Aortic valve area, by VTI measures 2.52 cm. Pulmonic Valve: The pulmonic valve was not well visualized. Pulmonic valve regurgitation is not visualized. Aorta: The aortic root and ascending aorta are structurally normal, with no evidence of dilitation. IAS/Shunts: The interatrial septum was not well visualized.  LEFT VENTRICLE PLAX 2D LVIDd:         4.50 cm   Diastology LVIDs:         2.60 cm   LV e' medial:    6.64 cm/s LV PW:         1.10 cm   LV E/e' medial:  9.9 LV IVS:        1.10 cm   LV e' lateral:   10.90 cm/s LVOT diam:     2.00 cm   LV E/e' lateral: 6.1 LV SV:         61 LV SV Index:   27 LVOT Area:     3.14 cm  RIGHT VENTRICLE RV Basal diam:  3.60 cm LEFT ATRIUM           Index        RIGHT ATRIUM            Index LA diam:      3.20 cm 1.44 cm/m   RA Area:     18.20 cm LA Vol (A2C): 64.3 ml 29.01 ml/m  RA Volume:   53.00 ml  23.91 ml/m LA Vol (A4C): 23.1 ml 10.42 ml/m  AORTIC VALVE AV Area (Vmax):    2.33 cm AV Area (Vmean):   2.21 cm AV Area (VTI):     2.52 cm AV Vmax:           134.00 cm/s AV Vmean:          89.600 cm/s AV VTI:  0.242 m AV Peak Grad:      7.2 mmHg AV Mean Grad:      4.0 mmHg LVOT Vmax:         99.40 cm/s LVOT Vmean:        63.000 cm/s LVOT VTI:          0.194 m LVOT/AV VTI ratio: 0.80  AORTA Ao Root diam: 2.80 cm Ao Asc diam:  2.60 cm MITRAL VALVE MV Area (PHT): 4.68 cm    SHUNTS MV Decel Time: 162 msec    Systemic VTI:  0.19 m MV E velocity: 66.00 cm/s  Systemic Diam: 2.00 cm MV A velocity: 90.80 cm/s MV E/A ratio:  0.73 [place] signed by Carolan Clines Signature Date/Time: 01/01/2023/10:41:00 AM    Final    MR SHOULDER LEFT WO CONTRAST  Result Date: 12/31/2022 CLINICAL DATA:  Shoulder trauma. Rotator cuff tear suspected. Left shoulder pain. EXAM: MRI OF THE LEFT SHOULDER WITHOUT CONTRAST TECHNIQUE: Multiplanar, multisequence MR imaging of the shoulder was performed. No intravenous contrast was administered. COMPARISON:  Left shoulder radiographs 12/23/2022 (multiple studies) FINDINGS: Despite efforts by the technologist and patient, mild motion artifact is present on today's exam and could not be eliminated. This reduces exam sensitivity and specificity. Rotator cuff: There is moderate intermediate T2 signal and thickening of the anterior infraspinatus tendon insertion suggesting tendinosis. Additional linear increased T2 signal suggesting a tear extending through the bursal tendon footprint insertion (coronal series 6, image 13 and sagittal series 10, images 19 and 20). This involves the majority of the transverse dimension of the anterior infraspinatus tendon insertion with likely minimal intact articular sided tendon fibers. Additional horizontal linear  increased T2 signal suggesting a high-grade partial-thickness articular sided tear of the adjacent posterior and mid aspect of the supraspinatus tendon fibers in a region measuring up to approximately 1.4 cm in AP dimension (sagittal series 10 images 19 and 20 and coronal images 15 and 16). The subscapularis and teres minor are intact. Muscles: No rotator cuff muscle atrophy, fatty infiltration, or edema. Biceps long head: The intra-articular long head of the biceps tendon is intact. Acromioclavicular Joint: There are mild degenerative changes of the acromioclavicular joint including joint space narrowing and peripheral osteophytosis. Type II acromion. Mild fluid within the subacromial/subdeltoid bursa. Glenohumeral Joint: Mild-to-moderate glenohumeral cartilage thinning. Small joint effusion likely extending through a defect within the inferior joint capsule, secondary to the recent glenohumeral dislocation. Labrum: Grossly intact, but evaluation is limited by lack of intraarticular fluid. Bones: There is high-grade marrow edema within the anterior greater tuberosity bordering the bicipital groove (axial series 5 images 8 through 13) with at least two curvilinear fracture lines and up to approximately 4 mm lateral cortical depression. No bony Bankart fracture/lesion is visualized. No definite bony Bankart lesion is seen. On the recent 12/23/2022 radiographs demonstrating left shoulder dislocation, no standard AP or transscapular Y-view was provided (likely due to decreased patient mobility), and the images are actually compatible with a more rare inferior shoulder dislocation. That would also be consistent with the anterior superolateral humeral head contusion and fracture. Other: None. IMPRESSION: 1. Moderate anterior infraspinatus tendinosis with a high-grade partial-thickness horizontal linear bursal sided tear of the anterior tendon footprint insertion. 2. High-grade partial-thickness horizontal linear  articular sided tear of the adjacent posterior and mid aspect of the supraspinatus tendon footprint. 3. High-grade marrow edema within the anterior greater tuberosity bordering the bicipital groove with at least two curvilinear fracture lines and up to approximately 4 mm  lateral cortical depression. This marrow edema would be compatible with a direct contusion, possibly from inferior shoulder dislocation. Recommend correlation with patient trauma history. 4. Small joint effusion likely extending through a defect within the inferior joint capsule, secondary to the recent glenohumeral dislocation. Electronically Signed   By: Neita Garnet M.D.   On: 12/31/2022 20:27   EEG adult  Result Date: 12/31/2022 Charlsie Quest, MD     12/31/2022 10:57 AM Patient Name: DEONKA DONOHOE MRN: [id number] Epilepsy Attending: Charlsie Quest Referring Physician/Provider: Caryl Pina, MD Date: 12/31/2022 Duration: 22.37 mins Patient history:  42 y.o. female with a PMHx of anxiety, depression, ADHD, prior sleeve gastrectomy, pseudotumor cerebri and migraine headaches who presents to the ED with acute onset of visual blurring, severe headache and malignant HTN. MRI brain reveals findings most consistent with PRES. EEG to evaluate for seizure Level of alertness: Awake AEDs during EEG study: None Technical aspects: This EEG study was done with scalp electrodes positioned according to the 10-20 International system of electrode placement. Electrical activity was reviewed with band pass filter of 1-70Hz , sensitivity of 7 uV/mm, display speed of 29mm/sec with a 60Hz  notched filter applied as appropriate. EEG data were recorded continuously and digitally stored.  Video monitoring was available and reviewed as appropriate. Description: The posterior dominant rhythm consists of 9 Hz activity of moderate voltage (25-35 uV) seen predominantly in posterior head regions, symmetric and reactive to eye opening and eye closing.  Hyperventilation and photic stimulation were not performed.   IMPRESSION: This study is within normal limits. No seizures or epileptiform discharges were seen throughout the recording. A normal interictal EEG does not exclude the diagnosis of epilepsy. Charlsie Quest   DG Chest 2 View  Result Date: 12/31/2022 CLINICAL DATA:  42 year old female with history of edema on neck CT. EXAM: CHEST - 2 VIEW COMPARISON:  Chest x-ray 10/22/2007. FINDINGS: Lung volumes are low. No consolidative airspace disease. No pleural effusions. No pneumothorax. No pulmonary nodule or mass noted. Pulmonary vasculature and the cardiomediastinal silhouette are within normal limits. IMPRESSION: Low lung volumes without radiographic evidence of acute cardiopulmonary disease. Electronically Signed   By: Trudie Reed M.D.   On: 12/31/2022 06:02   MR BRAIN WO CONTRAST  Result Date: 12/31/2022 CLINICAL DATA:  Headache and high blood pressure. Patient did not take blood pressure medication today EXAM: MRI HEAD WITHOUT CONTRAST TECHNIQUE: Multiplanar, multiecho pulse sequences of the brain and surrounding structures were obtained without intravenous contrast. COMPARISON:  CTA of the head neck from earlier today FINDINGS: Brain: Patchy T2 hyperintensity along the cortical and subcortical regions of the bilateral posterior frontal, parietal, and especially occipital lobes without restricted diffusion or hemorrhage. No hydrocephalus or masslike finding. Vascular: Major flow voids are preserved. Patent dural sinuses on prior CTA. Skull and upper cervical spine: Normal marrow signal. Sinuses/Orbits: Negative. IMPRESSION: Patchy edematous appearance along the superior and posterior aspect of the cerebral hemispheres, consistent with posterior reversible encephalopathy syndrome. No complicating hemorrhage or completed infarct. Electronically Signed   By: Tiburcio Pea M.D.   On: 12/31/2022 05:01   CT ANGIO HEAD NECK W WO CM  Result  Date: 12/31/2022 CLINICAL DATA:  Headache and dizziness EXAM: CT ANGIOGRAPHY HEAD AND NECK WITH AND WITHOUT CONTRAST TECHNIQUE: Multidetector CT imaging of the head and neck was performed using the standard protocol during bolus administration of intravenous contrast. Multiplanar CT image reconstructions and MIPs were obtained to evaluate the vascular anatomy. Carotid stenosis measurements (when applicable) are obtained  utilizing NASCET criteria, using the distal internal carotid diameter as the denominator. RADIATION DOSE REDUCTION: This exam was performed according to the departmental dose-optimization program which includes automated exposure control, adjustment of the mA and/or kV according to patient size and/or use of iterative reconstruction technique. CONTRAST:  70mL OMNIPAQUE IOHEXOL 350 MG/ML SOLN COMPARISON:  None Available. FINDINGS: CT HEAD FINDINGS Brain: No mass, hemorrhage or extra-axial collection. CSF spaces are normal. Normal appearance of the brain parenchyma. Skull: The visualized skull base, calvarium and extracranial soft tissues are normal. Sinuses/Orbits: No fluid levels or advanced mucosal thickening of the visualized paranasal sinuses. No mastoid or middle ear effusion. Normal orbits. CTA NECK FINDINGS SKELETON: No acute abnormality or high grade bony spinal canal stenosis. OTHER NECK: Normal pharynx, larynx and major salivary glands. No cervical lymphadenopathy. Unremarkable thyroid gland. UPPER CHEST: Small pleural effusions and mild pulmonary edema. AORTIC ARCH: There is no calcific atherosclerosis of the aortic arch. Normal variant aortic arch branching pattern with the brachiocephalic and left common carotid arteries sharing a common origin. RIGHT CAROTID SYSTEM: Normal without aneurysm, dissection or stenosis. LEFT CAROTID SYSTEM: Normal without aneurysm, dissection or stenosis. VERTEBRAL ARTERIES: Left dominant configuration. There is no dissection, occlusion or flow-limiting  stenosis to the skull base (V1-V3 segments). CTA HEAD FINDINGS POSTERIOR CIRCULATION: --Vertebral arteries: Normal V4 segments. --Inferior cerebellar arteries: Normal. --Basilar artery: Normal. --Superior cerebellar arteries: Normal. --Posterior cerebral arteries (PCA): Normal. ANTERIOR CIRCULATION: --Intracranial internal carotid arteries: Normal. --Anterior cerebral arteries (ACA): Normal. --Middle cerebral arteries (MCA): Normal. VENOUS SINUSES: As permitted by contrast timing, patent. ANATOMIC VARIANTS: Fetal origin of the right posterior cerebral artery. Review of the MIP images confirms the above findings. IMPRESSION: 1. No emergent large vessel occlusion or high-grade stenosis of the intracranial arteries. 2. Small pleural effusions and mild pulmonary edema. Electronically Signed   By: Deatra Robinson M.D.   On: 12/31/2022 02:50    Review of Systems  HENT:  Negative for ear discharge, ear pain, hearing loss and tinnitus.   Eyes:  Negative for photophobia and pain.  Respiratory:  Negative for cough and shortness of breath.   Cardiovascular:  Negative for chest pain.  Gastrointestinal:  Negative for abdominal pain, nausea and vomiting.  Genitourinary:  Negative for dysuria, flank pain, frequency and urgency.  Musculoskeletal:  Positive for arthralgias (Left shoulder). Negative for back pain, myalgias and neck pain.  Neurological:  Negative for dizziness and headaches.  Hematological:  Does not bruise/bleed easily.  Psychiatric/Behavioral:  The patient is not nervous/anxious.    Blood pressure (!) 148/73, pulse 86, temperature 97.9 F (36.6 C), temperature source Oral, resp. rate 18, height 5\' 4"  (1.626 m), weight 121.7 kg, last menstrual period 06/12/2015, SpO2 100%. Physical Exam Constitutional:      General: She is not in acute distress.    Appearance: She is well-developed. She is not diaphoretic.  HENT:     Head: Normocephalic and atraumatic.  Eyes:     General: No scleral icterus.        Right eye: No discharge.        Left eye: No discharge.     Conjunctiva/sclera: Conjunctivae normal.  Cardiovascular:     Rate and Rhythm: Normal rate and regular rhythm.  Pulmonary:     Effort: Pulmonary effort is normal. No respiratory distress.  Musculoskeletal:     Cervical back: Normal range of motion.     Comments: Left shoulder, elbow, wrist, digits- no skin wounds, mild TTP shoulder, no instability, no blocks to motion  Sens  R/M/U intact, Ax paresthetic  Mot   Ax/ R/ PIN/ M/ AIN/ U intact  Rad 2+  Skin:    General: Skin is warm and dry.  Neurological:     Mental Status: She is alert.  Psychiatric:        Mood and Affect: Mood normal.        Behavior: Behavior normal.     Assessment/Plan: Left shoulder dislocation -- Will allow out of sling for gentle ROM and ADL's. F/u with Dr. Ranell Patrick within 2 weeks, will need to start PT program. Sling for comfort only.    Freeman Caldron, PA-C Orthopedic Surgery [phone] 01/01/2023, 11:29 AM

## 2023-01-01 NOTE — Progress Notes (Addendum)
NEUROLOGY CONSULT FOLLOW UP NOTE   Date of service: January 01, 2023 Patient Name: [name] MRN:  [id number] DOB:  05/10/1980  Brief HPI  Eleasha SAMEERA SPRUNK is a 43 year old female presenting with severe headache, visual blurring and confusion. Severe HTN was noted on arrival. MRI brain reveals findings most consistent with PRES. CTA with no LVO. rEEG with no seizures. BP dipped to 80s systolic with labetalol. Now in 140s.   Interval Hx/subjective   No headache right now. Conversant and back to baseline  Vitals   Vitals:   12/31/22 2053 01/01/23 0149 01/01/23 0523 01/01/23 0921  BP: 122/79 (!) 141/95 (!) 145/90 (!) 148/73  Pulse: 84 76 74 86  Resp: 18 18 18    Temp: 97.8 F (36.6 C) 97.7 F (36.5 C) 97.7 F (36.5 C) 97.9 F (36.6 C)  TempSrc: Oral Oral Oral Oral  SpO2: 97% 99% 100% 100%  Weight:      Height:         Body mass index is 46.05 kg/m.  Physical Exam   Constitutional: Appears well-developed and well-nourished.  Psych: Affect appropriate to situation.  Eyes: No scleral injection.  HENT: No OP obstrucion.  Head: Normocephalic.  Cardiovascular: Normal rate and regular rhythm.  Respiratory: Effort normal, non-labored breathing.  GI: Soft.  No distension. There is no tenderness.  Skin: WDI.   Neurologic Examination  Mental status/Cognition: Alert, oriented to self, place, month and year, good attention.  Speech/language: Fluent, comprehension intact, object naming intact, repetition intact.  Cranial nerves:   CN II Pupils equal and reactive to light, no VF deficits    CN III,IV,VI EOM intact, no gaze preference or deviation, no nystagmus    CN V normal sensation in V1, V2, and V3 segments bilaterally    CN VII no asymmetry, no nasolabial fold flattening    CN VIII normal hearing to speech    CN IX & X normal palatal elevation, no uvular deviation    CN XI 5/5 head turn and 5/5 shoulder shrug bilaterally    CN XII midline tongue protrusion     Motor:  Muscle bulk: normal, tone normal, pronator drift none tremor none Mvmt Root Nerve  Muscle Right Left Comments  SA C5/6 Ax Deltoid 5  Limited L arm eval 2/2 severe shoulder pain  EF C5/6 Mc Biceps 5    EE C6/7/8 Rad Triceps 5    WF C6/7 Med FCR     WE C7/8 PIN ECU     F Ab C8/T1 U ADM/FDI 5 5   HF L1/2/3 Fem Illopsoas 5 4+ Known fusion of L hip and this is chronic per patient  KE L2/3/4 Fem Quad     DF L4/5 D Peron Tib Ant 5 5   PF S1/2 Tibial Grc/Sol 5 5    Sensation:  Light touch Intact throughout   Pin prick    Temperature    Vibration   Proprioception    Coordination/Complex Motor:  - Finger to Nose intact on the right, unable to do with L due to shoulder pain - Heel to shin unable to do due to body habitus. - Rapid alternating movement are intact BL - Gait: deferred.  Labs and Diagnostic Imaging   CBC:  Recent Labs  Lab 12/31/22 0900 01/01/23 0707  WBC 11.9* 9.3  NEUTROABS 8.8*  --   HGB 12.4 12.5  HCT 37.9 36.6  MCV 97.4 95.1  PLT 410* 324    Basic Metabolic Panel:  Lab Results  Component Value Date   NA 139 01/01/2023   K 3.8 01/01/2023   CO2 21 (L) 01/01/2023   GLUCOSE 90 01/01/2023   BUN 13 01/01/2023   CREATININE 1.00 01/01/2023   CALCIUM 8.9 01/01/2023   GFRNONAA >60 01/01/2023   GFRAA >60 02/18/2018   Lipid Panel: No results found for: "LDLCALC" HgbA1c:  Lab Results  Component Value Date   HGBA1C 5.4 12/31/2022   Urine Drug Screen:     Component Value Date/Time   LABOPIA NONE DETECTED 12/31/2022 0302   COCAINSCRNUR NONE DETECTED 12/31/2022 0302   LABBENZ POSITIVE (A) 12/31/2022 0302   AMPHETMU NONE DETECTED 12/31/2022 0302   THCU NONE DETECTED 12/31/2022 0302   LABBARB NONE DETECTED 12/31/2022 0302    Alcohol Level     Component Value Date/Time   ETH <10 12/31/2022 0110   INR  Lab Results  Component Value Date   INR 1.04 05/10/2016   APTT No results found for: "APTT" AED levels: No results found for: "PHENYTOIN",  "ZONISAMIDE", "LAMOTRIGINE", "LEVETIRACETA"  MRI Brain(Personally reviewed): Consistent with PRES.  rEEG:  This study is within normal limits. No seizures or epileptiform discharges were seen throughout the recording.   Impression   [name] is a 42 y.o. female p/w severe headache, confusion with poor attention and memory and blurred vision. 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 Significant hypertensive upon arrival. Was hypotensive with labetalol IV to 80s. Now BP in 140s systolic.  Presenting symptoms most likely secondary to PRES and rather than Pseudotumor.  Recommendations  - Aim for normotension - if there is concern for orthostasis, recommend compression stockings and abdominal binder. - follow up with Dr. Dolores Lory with [facility] Neurology outpatient. We will signoff. Please feel free to contact us with any questions or conerns. ______________________________________________________________________  Plan discussed with patient at bedside and with Dr. Pola Corn at bedside.  Thank you for the opportunity to take part in the care of this patient. If you have any further questions, please contact the neurology consultation team on call. Updated oncall schedule is listed on AMION.  Signed,  Erick Blinks

## 2023-01-01 NOTE — TOC CAGE-AID Note (Signed)
Transition of Care [facility]) - CAGE-AID Screening   Patient Details  Name: [name] MRN: [id number] Date of Birth: Nov 18, 1980  [facility]) CM/SW Contact:    Kermit Balo, RN Phone Number: 01/01/2023, 10:27 AM   Clinical Narrative:  Pt states she drinks alcohol very rarely. She denies the need for counseling resources.   CAGE-AID Screening:    Have You Ever Felt You Ought to Cut Down on Your Drinking or Drug Use?: No Have People Annoyed You By Critizing Your Drinking Or Drug Use?: No Have You Felt Bad Or Guilty About Your Drinking Or Drug Use?: No      Substance Abuse Education Offered: Yes (denied the need)

## 2023-01-01 NOTE — TOC Initial Note (Signed)
Transition of Care [facility]) - Initial/Assessment Note    Patient Details  Name: [name] MRN: [id number] Date of Birth: 10-Mar-1980  [facility]) CM/SW Contact:    Kermit Balo, RN Phone Number: 01/01/2023, 10:29 AM  Clinical Narrative:                  Pt is from home with her spouse and 2 teen age children. She is alone during the daytime. She says her spouse can check in on her more frequently after d/c. He owns his business.  No current DME at home.  She was driving self but says her spouse can assist.  She manages her own medications and denies any issues.  No f/u per PT.  TOC following.  Expected Discharge Plan: Home/Self Care Barriers to Discharge: Continued Medical Work up   Patient Goals and CMS Choice            Expected Discharge Plan and Services   Discharge Planning Services: CM Consult   Living arrangements for the past 2 months: Single Family Home                                      Prior Living Arrangements/Services Living arrangements for the past 2 months: Single Family Home Lives with:: Spouse, Minor Children Patient language and need for interpreter reviewed:: Yes Do you feel safe going back to the place where you live?: Yes        Care giver support system in place?: No (comment)   Criminal Activity/Legal Involvement Pertinent to Current Situation/Hospitalization: No - Comment as needed  Activities of Daily Living   ADL Screening (condition at time of admission) Independently performs ADLs?: No Does the patient have a NEW difficulty with bathing/dressing/toileting/self-feeding that is expected to last >3 days?: Yes (Initiates electronic notice to provider for possible OT consult) Does the patient have a NEW difficulty with getting in/out of bed, walking, or climbing stairs that is expected to last >3 days?: Yes (Initiates electronic notice to provider for possible PT consult) Does the patient have a NEW  difficulty with communication that is expected to last >3 days?: No Is the patient deaf or have difficulty hearing?: Yes Does the patient have difficulty seeing, even when wearing glasses/contacts?: Yes Does the patient have difficulty concentrating, remembering, or making decisions?: No  Permission Sought/Granted                  Emotional Assessment Appearance:: Appears stated age Attitude/Demeanor/Rapport: Engaged Affect (typically observed): Accepting Orientation: : Oriented to Self, Oriented to Place, Oriented to  Time, Oriented to Situation   Psych Involvement: No (comment)  Admission diagnosis:  Bad headache [R51.9] PRES (posterior reversible encephalopathy syndrome) [I67.83] Patient Active Problem List   Diagnosis Date Noted   PRES (posterior reversible encephalopathy syndrome) 12/31/2022   Prolonged QT interval 12/31/2022   New onset of congestive heart failure (HCC) 12/31/2022   Elevated troponin 12/31/2022   Leukocytosis 12/31/2022   Polycythemia 12/31/2022   Thrombocytosis 12/31/2022   History of closed shoulder dislocation 12/31/2022   Hypotension 12/31/2022   Obesity, Class III, BMI 40-49.9 (morbid obesity) (HCC) 12/31/2022   Dysmenorrhea 12/30/2022   Anxiety 04/18/2021   Chronic migraine w/o aura w/o status migrainosus, not intractable 02/14/2021   Long term prescription benzodiazepine use 11/01/2018   Iron deficiency 02/17/2018   S/P lumbar fusion 10/28/2017   Chronic pain  syndrome 06/05/2017   Headache 03/10/2017   Low back pain 03/09/2017   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   Intractable chronic migraine without aura and with status migrainosus 05/09/2016   Pseudotumor cerebri 05/06/2016   Obesity 05/06/2016   Chronic migraine 05/06/2016   Attention deficit hyperactivity disorder (ADHD), combined type 04/15/2016   GAD (generalized anxiety disorder) 04/15/2016   History of partial hysterectomy 04/15/2016   Dyspnea 06/23/2015   Edema of foot  06/23/2015   Pelvic pain in female 06/19/2015   PCP:  Morrell Riddle, PA-C Pharmacy:   [id number] - [place], [place] - [address] AT [facility] [address] [place] [address] Phone: [phone] Fax: [phone]  [facility] [address] [place] - [address] [place] [address] Phone: [phone] Fax: [phone]     Social Determinants of Health (SDOH) Social History: SDOH Screenings   Food Insecurity: No Food Insecurity (12/31/2022)  Housing: Low Risk  (12/31/2022)  Transportation Needs: No Transportation Needs (12/31/2022)  Utilities: Not At Risk (12/31/2022)  Financial Resource Strain: Low Risk  (06/05/2022)   Received from [facility]  Social Connections: Unknown (07/01/2021)   Received from [facility]  Tobacco Use: Low Risk  (12/30/2022)   SDOH Interventions:     Readmission Risk Interventions     No data to display

## 2023-01-01 NOTE — Progress Notes (Signed)
Heart Failure Navigator Progress Note  Assessed for Heart & Vascular TOC clinic readiness.  Patient does not meet criteria due to EF 60-65%.Elevated BNP likely due to uncontrolled Hypertension per MD.  Navigator will sign off at this time.  Roxy Horseman, RN, BSN [facility] Heart Failure Navigator Secure Chat Only

## 2023-01-01 NOTE — Evaluation (Signed)
Physical Therapy Evaluation Patient Details Name: [name] MRN: [id number] DOB: 13-Nov-1980 Today's Date: 01/01/2023  History of Present Illness  42 y.o. female presents 10/29 with acute onset of visual blurring, severe headache and malignant HTN. MRI brain reveals findings most consistent with PRES. PMHx of anxiety, depression, ADHD, prior sleeve gastrectomy, pseudotumor cerebri and migraine headaches.   Clinical Impression  Patient evaluated by Physical Therapy with no further acute PT needs identified. All education has been completed and the patient has no further questions. Aside from her left shoulder injury, pt appears to be functioning safely. BERG indicates low fall risk. Ambulates without overt deficits throughout hallway, no assistive device. Pt wore Lt shoulder sling during session, plans to follow up with ortho as outpatient (which was scheduled but missed due to hospitalization.) All questions answered. See below for any follow-up Physical Therapy or equipment needs. PT is signing off. Thank you for this referral.         If plan is discharge home, recommend the following: Assist for transportation   Can travel by private vehicle    yes    Equipment Recommendations None recommended by PT     Functional Status Assessment Patient has not had a recent decline in their functional status     Precautions / Restrictions Precautions Precautions: Fall Required Braces or Orthoses: Sling (Lt shoulder) Restrictions Weight Bearing Restrictions: No      Mobility  Bed Mobility Overal bed mobility: Modified Independent             General bed mobility comments: no assist    Transfers Overall transfer level: Modified independent Equipment used: None               General transfer comment: Mod I, no assist, LUE sling in place    Ambulation/Gait Ambulation/Gait assistance: Modified independent (Device/Increase time) Gait Distance (Feet): 200  Feet Assistive device: None Gait Pattern/deviations: WFL(Within Functional Limits)   Gait velocity interpretation: >2.62 ft/sec, indicative of community ambulatory   General Gait Details: Grossly WNL, no overt instability noted, navigates throughout hallways around obstacles. No physical assist needed or AD recommended  Stairs            Wheelchair Mobility     Tilt Bed    Modified Rankin (Stroke Patients Only)       Balance Overall balance assessment: Independent                               Standardized Balance Assessment Standardized Balance Assessment : Berg Balance Test Berg Balance Test Sit to Stand: Able to stand without using hands and stabilize independently Standing Unsupported: Able to stand safely 2 minutes Sitting with Back Unsupported but Feet Supported on Floor or Stool: Able to sit safely and securely 2 minutes Stand to Sit: Sits safely with minimal use of hands Transfers: Able to transfer safely, minor use of hands Standing Unsupported with Eyes Closed: Able to stand 10 seconds safely Standing Ubsupported with Feet Together: Able to place feet together independently and stand 1 minute safely From Standing, Reach Forward with Outstretched Arm: Can reach confidently >25 cm (10") From Standing Position, Pick up Object from Floor: Able to pick up shoe safely and easily From Standing Position, Turn to Look Behind Over each Shoulder: Looks behind from both sides and weight shifts well Turn 360 Degrees: Able to turn 360 degrees safely in 4 seconds or less Standing Unsupported, Alternately Place Feet  on Step/Stool: Able to stand independently and complete 8 steps >20 seconds Standing Unsupported, One Foot in Front: Able to plae foot ahead of the other independently and hold 30 seconds Standing on One Leg: Able to lift leg independently and hold equal to or more than 3 seconds Total Score: 52         Pertinent Vitals/Pain Pain Assessment Pain  Assessment: Faces Faces Pain Scale: Hurts even more Pain Location: Lt shoulder Pain Descriptors / Indicators: Aching Pain Intervention(s): Monitored during session, Repositioned, Limited activity within patient's tolerance    Home Living Family/patient expects to be discharged to:: Private residence Living Arrangements: Spouse/significant other;Children Available Help at Discharge: Family;Available 24 hours/day Type of Home: House Home Access: Stairs to enter Entrance Stairs-Rails: Left Entrance Stairs-Number of Steps: 2.5   Home Layout: One level Home Equipment: None      Prior Function Prior Level of Function : Independent/Modified Independent;History of Falls (last six months)             Mobility Comments: ind ADLs Comments: ind, drives, stay at home mom     Extremity/Trunk Assessment   Upper Extremity Assessment Upper Extremity Assessment: Defer to OT evaluation    Lower Extremity Assessment Lower Extremity Assessment: Overall WFL for tasks assessed (Reports hx hyperesthesias from traumatic injury in LLE.)       Communication   Communication Communication: No apparent difficulties  Cognition Arousal: Alert Behavior During Therapy: WFL for tasks assessed/performed Overall Cognitive Status: Within Functional Limits for tasks assessed                                          General Comments      Exercises     Assessment/Plan    PT Assessment Patient does not need any further PT services  PT Problem List         PT Treatment Interventions      PT Goals (Current goals can be found in the Care Plan section)  Acute Rehab PT Goals Patient Stated Goal: Reduce shoulder pain PT Goal Formulation: All assessment and education complete, DC therapy    Frequency       Co-evaluation               AM-PAC PT "6 Clicks" Mobility  Outcome Measure Help needed turning from your back to your side while in a flat bed without using  bedrails?: None Help needed moving from lying on your back to sitting on the side of a flat bed without using bedrails?: None Help needed moving to and from a bed to a chair (including a wheelchair)?: None Help needed standing up from a chair using your arms (e.g., wheelchair or bedside chair)?: None Help needed to walk in hospital room?: None Help needed climbing 3-5 steps with a railing? : None 6 Click Score: 24    End of Session   Activity Tolerance: Patient tolerated treatment well Patient left: in bed;with call bell/phone within reach;with bed alarm set   PT Visit Diagnosis: History of falling (Z91.81);Other symptoms and signs involving the nervous system (R29.898)    Time: 4098-1191 PT Time Calculation (min) (ACUTE ONLY): 18 min   Charges:   PT Evaluation $PT Eval Low Complexity: 1 Low   PT General Charges $$ ACUTE PT VISIT: 1 Visit         Kathlyn Sacramento, PT, DPT [facility]  Rehabilitation Services Physical Therapist Office: [phone] Website: [email or web address]   Berton Mount 01/01/2023, 9:57 AM

## 2023-01-01 NOTE — Progress Notes (Signed)
PROGRESS NOTE  [name]  DOB: 1980/03/11  PCP: Morrell Riddle, PA-C QMV:[id number]  DOA: 12/30/2022  LOS: 1 day  Hospital Day: 3  Brief narrative: [name] is a 42 y.o. 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 She reported having a migraine headache earlier than the day with associated nausea and transient blurry vision that is not uncommon.  She had taken a nap and later on that evening got up to take a shower.  Husband reports that as she was walking into the shower she became confused, reported that she was unable to see, and was talking out of her head.  Reported that she did not know who they were thereafter passed out.   EMS noted significant elevated blood pressure to over 240 systolic.  In the ED, blood pressure was in 170s CT angio of head did not show any emergent large vessel occlusion or high-grade stenosis. MRI of the brain showed patchy edematous appearance along the superior and posterior aspect of the cerebral hemispheres consistent with PRES Labs showed WBC count 15.8, hemoglobin 15.2, glucose 146,  troponin 65> 67, BNP over 1100 Patient was given IV Toradol, IV Benadryl, oral clonidine, IV labetalol and IV Lasix. Neurology consultation was obtained.  Of note, on 10/22, one day prior to this presentation, patient had a fall.  She was seen in the ED, found to have left anterior shoulder dislocation.  She underwent closed reduction in the ED and was discharged home to follow-up with orthopedics as an outpatient.  On further questioning about the event, patient seems to have amnesia around the time of fall last week which raise suspicion of an unwitnessed seizure per neurology note.  Given the severity of the pain, MRI left shoulder was obtained this admission which  showed 1. Moderate anterior infraspinatus tendinosis with a high-grade partial-thickness horizontal linear bursal sided tear of the anterior tendon footprint insertion. 2. High-grade partial-thickness horizontal linear articular sided tear of the adjacent posterior and mid aspect of the supraspinatus tendon footprint. 3. High-grade marrow edema within the anterior greater tuberosity bordering the bicipital groove with at least two curvilinear fracture lines and up to approximately 4 mm lateral cortical depression. This marrow edema would be compatible with a direct contusion, possibly from inferior shoulder dislocation. Recommend correlation with patient trauma history. 4. Small joint effusion likely extending through a defect within the inferior joint capsule, secondary to the recent glenohumeral dislocation.  Subjective: Patient was seen and examined this morning.  Pleasant morbidly obese young Caucasian female. Lying in bed.  We had a detailed conversation about her previous 2 episodes of falls as detailed above. In the day yesterday, blood pressure in low in 80s and dipped as low as 60s.  Blood pressure gradually improved and overnight stayed in 140s.  Assessment and plan: Posterior reversible encephalopathy syndrome Presented with blurry vision, confusion, and syncope. EMS noted blood pressure significant elevated to over 240. Imagings as above with MRI showing findings consistent with PRES Neurology consult appreciated.  EEG unremarkable.  Blood pressure management recommended.   Hypertensive encephalopathy Hypotension Blood pressure was significantly elevated initially for which she got IV labetalol, IV Lasix.  Blood pressure then dropped for several hours and it stayed in 80s and dipped as low as 60s. Over the next several hours, blood pressure is now been trending up and in 140s  this morning. PTA meds- clonidine 0.1-0.3 mg 3 times daily, propranolol 120 mg daily, Lasix as needed  continue to monitor Currently meds on hold given significant hypotension yesterday.  Blood pressure rising up to 160s. I resumed propranolol 120 mg daily this morning.  Also resume clonidine at 0.1 mg 3 times daily.   Monitor orthostatics. Continue to monitor blood pressure control.  IV hydralazine as needed.  Elevated BNP Elevated troponin Elevated BNP and troponin could be likely due to uncontrolled hypertension. Pending echocardiogram to rule out CHF and wall motion abnormality Net IO Since Admission: 153 mL [01/01/23 1319] Continue to monitor for daily intake output, weight, blood pressure, BNP, renal function and electrolytes. Recent Labs  Lab 12/31/22 0002 12/31/22 0353 12/31/22 0856 01/01/23 0707  BNP  --  1,141.4*  --   --   BUN 12  --   --  13  CREATININE 0.94  --   --  1.00  NA 137  --   --  139  K 4.6  --   --  3.8  MG  --   --  1.7  --    Recent Labs    12/31/22 0002 12/31/22 0353  TROPONINIHS 65* 67*   Left shoulder dislocation S/p closed reduction - 10/22 Rupture of tendons Prior to arrival, patient had a presented to the emergency department on 10/20 after having a fall sustaining a left shoulder dislocation.  Patient was waiting for an outpatient appointment with orthopedics. Given the severe of left shoulder pain, MRI was obtained with findings as above. Orthopedics consult appreciated.  Noted a plan to allow out sling for gentle range of movement and ADLs.  To reapply sling for comfort only.  To follow-up with orthopedics Dr. Devonne Doughty within 2 weeks.      Leukocytosis Likely due to to pain and acute distress.  Procalcitonin not elevated.  No evidence of infection.  Not on antibiotics. Recent Labs  Lab 12/31/22 0002 12/31/22 0856 12/31/22 0900 01/01/23 0707  WBC 15.8*  --  11.9* 9.3  PROCALCITON  --  <0.10  --   --    Prolonged QT interval Hypokalemia/hypomagnesemia Admission EKG with QTc prolonged up to 559 Potassium and magnesium level checked.    Replacements given with target K >4 and Mg> 2 Avoid QT prolonging medications Repeat EKG. Recent Labs  Lab 12/31/22 0002 12/31/22 0856 01/01/23 0707  K 4.6  --  3.8  MG  --  1.7  --    Anxiety/depression PTA meds- Xanax 0.5 mg daily, Zoloft 100 mg daily, along with clonidine and propranolol  Resumed Xanax and propranolol.  Morbid Obesity  H/o gastric sleeve surgery Body mass index is 46.05 kg/m. Patient has been advised to make an attempt to improve diet and exercise patterns to aid in weight loss.   Mobility: Encourage ambulation  Goals of care   Code Status: Full Code     DVT prophylaxis:  enoxaparin (LOVENOX) injection 40 mg Start: 12/31/22 1000   Antimicrobials: None Fluid: None Consultants: Neurology, orthopedics Family Communication: None at bedside  Status: Inpatient Level of care:  Progressive   Patient is from: Home Needs to continue in-hospital care: Continue to monitor orthostatic blood pressure Anticipated d/c to: Home hopefully tomorrow      Diet:  Diet Order             Diet Heart Room service appropriate? Yes; Fluid consistency: Thin  Diet effective now  Scheduled Meds:  ALPRAZolam  0.25 mg Oral BID   cloNIDine  0.1 mg Oral TID   enoxaparin (LOVENOX) injection  40 mg Subcutaneous Q24H   magnesium oxide  400 mg Oral BID   propranolol ER  120 mg Oral Daily   sertraline  100 mg Oral Daily   sodium chloride flush  3 mL Intravenous Q12H    PRN meds: acetaminophen **OR** acetaminophen, albuterol, diphenhydrAMINE, hydrALAZINE, HYDROcodone-acetaminophen, melatonin, morphine injection, trimethobenzamide   Infusions:     Antimicrobials: Anti-infectives (From admission, onward)    None       Objective: Vitals:   01/01/23 1217 01/01/23 1229  BP: (!) 161/119 (!) 162/115  Pulse: (!) 101 82  Resp:    Temp: 98 F (36.7 C) 98.1 F (36.7 C)  SpO2: 99% 100%    Intake/Output Summary (Last 24 hours) at  01/01/2023 1319 Last data filed at 12/31/2022 2100 Gross per 24 hour  Intake 150 ml  Output 0 ml  Net 150 ml   Filed Weights   12/30/22 2339 12/31/22 1605  Weight: 117.9 kg 121.7 kg   Weight change: 3.765 kg Body mass index is 46.05 kg/m.   Physical Exam: General exam: Pleasant, young Caucasian female. Skin: No rashes, lesions or ulcers. HEENT: Atraumatic, normocephalic, no obvious bleeding Lungs: Clear to auscultation bilaterally CVS: Regular rate and rhythm, no murmur GI/Abd soft, nontender, distended from obesity, bowel sound present  CNS: Alert, awake, oriented x 3 Psychiatry: Mood appropriate Extremities: No pedal edema, no calf tenderness.  Left shoulder able to move with pain  Data Review: I have personally reviewed the laboratory data and studies available.  F/u labs ordered Unresulted Labs (From admission, onward)    None       Total time spent in review of labs and imaging, patient evaluation, formulation of plan, documentation and communication with family: 55 minutes  Signed, Lorin Glass, MD [facility] 01/01/2023

## 2023-01-02 DIAGNOSIS — I6783 Posterior reversible encephalopathy syndrome: Secondary | ICD-10-CM | POA: Diagnosis not present

## 2023-01-02 MED ORDER — ONDANSETRON HCL 4 MG/2ML IJ SOLN
4.0000 mg | Freq: Four times a day (QID) | INTRAMUSCULAR | Status: DC | PRN
  Administered 2023-01-02: 4 mg via INTRAVENOUS
  Filled 2023-01-02: qty 2

## 2023-01-02 MED ORDER — CLONIDINE HCL 0.1 MG PO TABS: 0.2000 mg | ORAL_TABLET | Freq: Three times a day (TID) | ORAL | Status: DC

## 2023-01-02 MED ORDER — SPIRONOLACTONE 25 MG PO TABS: 25.0000 mg | ORAL_TABLET | Freq: Every day | ORAL | 0 refills | Status: DC

## 2023-01-02 NOTE — Progress Notes (Signed)
Occupational Therapy Treatment Patient Details Name: [name] MRN: [id number] DOB: Jan 11, 1981 Today's Date: 01/02/2023   History of present illness 42 y.o. female presents 10/29 with acute onset of visual blurring, severe headache and malignant HTN. MRI brain reveals findings most consistent with PRES. L shoulder MRI demonstrated partial-thickness tears of anterior infraspinatus tendinosis and supraspinatus tendon. PMHx:  hypertension, depression, GERD, pseudotumor cerebri, migraine headaches, ADHD   OT comments  Pt is making good progress towards their acute OT goals. Pt resting with sling off, continues to report it is more comfortable with sling off. Educated to try wearing sling when going out in the community for joint protection. L shoulder continues to be extremely painful with active movements,  pt able to tolerate "dangle method" with gravity. Educated pt in LUE compensatory techniques for UB ADLs, pt demonstrated good ability to complete with min A overall. Also reviewed safety in regard to BP management, pt and family verbalized understanding. OT to continue to follow acutely to facilitate progress towards established goals. Pt will continue to benefit from OP OT for continued management of LUE.        If plan is discharge home, recommend the following:  A little help with bathing/dressing/bathroom;Assistance with cooking/housework;Assist for transportation;Help with stairs or ramp for entrance;Direct supervision/assist for medications management   Equipment Recommendations  None recommended by OT       Precautions / Restrictions Precautions Precautions: Fall Required Braces or Orthoses: Sling (for comfort) Restrictions Weight Bearing Restrictions: No       Mobility Bed Mobility Overal bed mobility: Modified Independent      Transfers Overall transfer level: Modified independent         Balance Overall balance assessment: No apparent balance deficits (not  formally assessed)             ADL either performed or assessed with clinical judgement   ADL Overall ADL's : Needs assistance/impaired     Grooming: Minimal assistance;Brushing hair Grooming Details (indicate cue type and reason): min A for hair - reviewed compensatory techniques. Pt states she was bending over in standing to put up hair - educated to do this in sitting instead for safety and BP Upper Body Bathing: Minimal assistance;Cueing for compensatory techniques;Sitting Upper Body Bathing Details (indicate cue type and reason): min A for cues on compensatory techniques - limited use of L     Upper Body Dressing : Minimal assistance;Cueing for compensatory techniques Upper Body Dressing Details (indicate cue type and reason): cues for compensatory techniques     Toilet Transfer: Ambulation           Functional mobility during ADLs: Modified independent General ADL Comments: reviewed safe showering and frequent BP checks    Extremity/Trunk Assessment Upper Extremity Assessment Upper Extremity Assessment: LUE deficits/detail RUE Deficits / Details: WFL LUE Deficits / Details: MRI shows partial tendon tears, all active movement at shoulder is extremely painful. Elbow wrist and hand are [facility]. Pt has sling but reports it is more comfortable to rest out of the sling - no formal orders written LUE Sensation: WNL LUE Coordination: decreased fine motor;decreased gross motor   Lower Extremity Assessment Lower Extremity Assessment: Overall WFL for tasks assessed        Vision   Vision Assessment?: No apparent visual deficits   Perception Perception Perception: Within Functional Limits   Praxis Praxis Praxis: WFL    Cognition Arousal: Alert Behavior During Therapy: WFL for tasks assessed/performed Overall Cognitive Status: Within Functional Limits for tasks  assessed     General Comments: anxious about pain management of L shoulder, reports she has hx of pain  medication abuse and now goes to a pain clinic              General Comments VSS, family in the room    Pertinent Vitals/ Pain       Pain Assessment Pain Assessment: Faces Faces Pain Scale: Hurts even more Pain Location: L shoulder with active range Pain Descriptors / Indicators: Aching Pain Intervention(s): Limited activity within patient's tolerance, Monitored during session   Frequency  Min 1X/week        Progress Toward Goals  OT Goals(current goals can now be found in the care plan section)  Progress towards OT goals: Progressing toward goals  Acute Rehab OT Goals Patient Stated Goal: to go home OT Goal Formulation: With patient Time For Goal Achievement: 01/15/23 Potential to Achieve Goals: Good ADL Goals Pt Will Perform Grooming: with modified independence;sitting Pt Will Perform Upper Body Dressing: with modified independence;sitting Pt Will Perform Lower Body Dressing: with min assist;sit to/from stand Pt Will Transfer to Toilet: Independently      AM-PAC OT "6 Clicks" Daily Activity     Outcome Measure   Help from another person eating meals?: A Little Help from another person taking care of personal grooming?: A Little Help from another person toileting, which includes using toliet, bedpan, or urinal?: A Little Help from another person bathing (including washing, rinsing, drying)?: A Little Help from another person to put on and taking off regular upper body clothing?: A Little Help from another person to put on and taking off regular lower body clothing?: A Little 6 Click Score: 18    End of Session    OT Visit Diagnosis: Unsteadiness on feet (R26.81);Muscle weakness (generalized) (M62.81);Pain   Activity Tolerance Patient tolerated treatment well   Patient Left in bed;with call bell/phone within reach;with family/visitor present   Nurse Communication Mobility status        Time: 1150-1206 OT Time Calculation (min): 16 min  Charges: OT  General Charges $OT Visit: 1 Visit OT Treatments $Self Care/Home Management : 8-22 mins  Derenda Mis, OTR/L Acute Rehabilitation Services Office [phone] Secure Chat Communication Preferred   Donia Pounds 01/02/2023, 1:17 PM

## 2023-01-02 NOTE — Plan of Care (Signed)
  Problem: Education: Goal: Ability to demonstrate management of disease process will improve Outcome: Progressing   Problem: Activity: Goal: Capacity to carry out activities will improve Outcome: Progressing   Problem: Cardiac: Goal: Ability to achieve and maintain adequate cardiopulmonary perfusion will improve Outcome: Progressing   Problem: Education: Goal: Knowledge of General Education information will improve Description: Including pain rating scale, medication(s)/side effects and non-pharmacologic comfort measures Outcome: Progressing   Problem: Clinical Measurements: Goal: Ability to maintain clinical measurements within normal limits will improve Outcome: Progressing Goal: Will remain free from infection Outcome: Progressing Goal: Respiratory complications will improve Outcome: Progressing Goal: Cardiovascular complication will be avoided Outcome: Progressing   Problem: Activity: Goal: Risk for activity intolerance will decrease Outcome: Progressing   Problem: Nutrition: Goal: Adequate nutrition will be maintained Outcome: Progressing   Problem: Elimination: Goal: Will not experience complications related to urinary retention Outcome: Progressing   Problem: Education: Goal: Knowledge of General Education information will improve Description: Including pain rating scale, medication(s)/side effects and non-pharmacologic comfort measures Outcome: Progressing   Problem: Pain Management: Goal: General experience of comfort will improve Outcome: Not Progressing

## 2023-01-02 NOTE — Progress Notes (Signed)
Reviewed all discharge instructions including medications and follow up appointments verbalized understanding

## 2023-01-02 NOTE — TOC Transition Note (Signed)
Transition of Care [facility]) - CM/SW Discharge Note   Patient Details  Name: RIONA LAHTI MRN: [id number] Date of Birth: 1980/10/25  [facility]) CM/SW Contact:  Kermit Balo, RN Phone Number: 01/02/2023, 12:32 PM   Clinical Narrative:     Patient is discharging home with outpt therapy through [facility].  Pt has transportation home.  Final next level of care: OP Rehab Barriers to Discharge: No Barriers Identified   Patient Goals and CMS Choice CMS Medicare.gov Compare Post Acute Care list provided to:: Patient    Discharge Placement                         Discharge Plan and Services Additional resources added to the After Visit Summary for     Discharge Planning Services: CM Consult                                 Social Determinants of Health (SDOH) Interventions SDOH Screenings   Food Insecurity: No Food Insecurity (12/31/2022)  Housing: Low Risk  (12/31/2022)  Transportation Needs: No Transportation Needs (12/31/2022)  Utilities: Not At Risk (12/31/2022)  Financial Resource Strain: Low Risk  (06/05/2022)   Received from [facility], [facility]  Social Connections: Unknown (07/01/2021)   Received from [facility], [facility]  Tobacco Use: Low Risk  (12/30/2022)     Readmission Risk Interventions     No data to display

## 2023-01-02 NOTE — Plan of Care (Signed)

## 2023-01-02 NOTE — TOC Progression Note (Signed)
Transition of Care [facility]) - Progression Note    Patient Details  Name: [name] MRN: [id number] Date of Birth: [birth year]  Transition of Care [facility]) CM/SW Contact  Kermit Balo, RN Phone Number: 01/02/2023, 11:14 AM  Clinical Narrative:     Pt is interested in outpt therapy as recommended. She prefers [facility].  CM has sent in referral to [facility] for outpt OT. Information on the AVS. Pt will call to schedule the first appointment. TOC following.  Expected Discharge Plan: Home/Self Care Barriers to Discharge: Continued Medical Work up  Expected Discharge Plan and Services   Discharge Planning Services: CM Consult   Living arrangements for the past 2 months: Single Family Home                                       Social Determinants of Health (SDOH) Interventions SDOH Screenings   Food Insecurity: No Food Insecurity (12/31/2022)  Housing: Low Risk  (12/31/2022)  Transportation Needs: No Transportation Needs (12/31/2022)  Utilities: Not At Risk (12/31/2022)  Financial Resource Strain: Low Risk  (06/05/2022)   Received from [facility], [facility]  Social Connections: Unknown (07/01/2021)   Received from [facility], [facility]  Tobacco Use: Low Risk  (12/30/2022)    Readmission Risk Interventions     No data to display

## 2023-01-02 NOTE — Discharge Summary (Signed)
Physician Discharge Summary  [name] YHC:[id number] DOB: 1980/07/22 DOA: 12/30/2022  PCP: Morrell Riddle, PA-C  Admit date: 12/30/2022 Discharge date: 01/02/2023  Admitted From: Home Discharge disposition: Home  Recommendations at discharge:  Your blood pressure is uncontrolled.  Spironolactone 25 mg daily has been added to your regimen of blood pressure medicines.  Continue to monitor daily blood pressure at home.  Follow-up with PCP for further adjustment. Recommend lifestyle changes with diet and exercise for weight loss.  Brief narrative: [name] is a 42 y.o. 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 She reported having a migraine headache earlier than the day with associated nausea and transient blurry vision that is not uncommon.  She had taken a nap and later on that evening got up to take a shower.  Husband reports that as she was walking into the shower she became confused, reported that she was unable to see, and was talking out of her head.  Reported that she did not know who they were thereafter passed out.   EMS noted significant elevated blood pressure to over 240 systolic.  In the ED, blood pressure was in 170s CT angio of head did not show any emergent large vessel occlusion or high-grade stenosis. MRI of the brain showed patchy edematous appearance along the superior and posterior aspect of the cerebral hemispheres consistent with PRES Labs showed WBC count 15.8, hemoglobin 15.2, glucose 146,  troponin 65> 67, BNP over 1100 Patient was given IV Toradol, IV Benadryl, oral clonidine, IV labetalol and IV Lasix. Neurology consultation was obtained.  Of note, on 10/22, one day prior to this presentation, patient had a fall.  She was seen in the ED, found to have left  anterior shoulder dislocation.  She underwent closed reduction in the ED and was discharged home to follow-up with orthopedics as an outpatient.  On further questioning about the event, patient seems to have amnesia around the time of fall last week which raise suspicion of an unwitnessed seizure per neurology note.  Given the severity of the pain, MRI left shoulder was obtained this admission which showed 1. Moderate anterior infraspinatus tendinosis with a high-grade partial-thickness horizontal linear bursal sided tear of the anterior tendon footprint insertion. 2. High-grade partial-thickness horizontal linear articular sided tear of the adjacent posterior and mid aspect of the supraspinatus tendon footprint. 3. High-grade marrow edema within the anterior greater tuberosity bordering the bicipital groove with at least two curvilinear fracture lines and up to approximately 4 mm lateral cortical depression. This marrow edema would be compatible with a direct contusion, possibly from inferior shoulder dislocation. Recommend correlation with patient trauma history. 4. Small joint effusion likely extending through a defect within the inferior joint capsule, secondary to the recent glenohumeral dislocation.  Subjective: Patient was seen and examined this morning.  Pleasant morbidly obese young Caucasian female. Sitting up in bed.  Husband and kids at bedside. We had a long conversation about her blood pressure, extra weight and the need of lifestyle changes.  We also talked about medication adjustment which patient is agreeable to.  Hospital course: Posterior reversible encephalopathy syndrome Hypertensive encephalopathy Presented with blurry vision, confusion, and syncope. EMS noted blood pressure significant elevated to over 240. Imagings as above with MRI showing findings consistent with PRES Neurology consult appreciated.  EEG unremarkable.  Strongly recommended to manage blood  pressure.     Uncontrolled hypertension Hypotension Blood pressure was significantly elevated initially for which she got IV labetalol, IV Lasix.  Blood pressure then dropped for several hours and it stayed in 80s and dipped as low as 60s. Over the next several hours, blood pressure improved and consistently remained elevated. PTA meds- clonidine 0.1-0.3 mg 3 times daily, propranolol 120 mg daily, HCTZ as needed. Home dose of clonidine and propranolol resumed.  Blood pressure still remains elevated.  I have added Aldactone 25 mg daily.  Patient states he also has a prescription for hydrochlorothiazide to be taken as needed but she has never used it. Echocardiogram showed mild LVH and grade 1 diastolic dysfunction.  She probably has chronic uncontrolled hypertension leading to hypertensive heart disease. Continue to monitor daily blood pressure at home.  Follow-up with PCP for further adjustment.  Elevated BNP Elevated troponin Elevated BNP and troponin could be likely due to uncontrolled hypertension. Echocardiogram showed normal EF and did not show any wall motion abnormality. Net IO Since Admission: 153 mL [01/02/23 1138] Continue to monitor for daily intake output, weight, blood pressure, BNP, renal function and electrolytes. Recent Labs  Lab 12/31/22 0002 12/31/22 0353 12/31/22 0856 01/01/23 0707  BNP  --  1,141.4*  --   --   BUN 12  --   --  13  CREATININE 0.94  --   --  1.00  NA 137  --   --  139  K 4.6  --   --  3.8  MG  --   --  1.7  --    Recent Labs    12/31/22 0002 12/31/22 0353  TROPONINIHS 65* 67*   Left shoulder dislocation S/p closed reduction - 10/22 Rupture of tendons Prior to arrival, patient had a presented to the emergency department on 10/20 after having a fall sustaining a left shoulder dislocation.  Patient was waiting for an outpatient appointment with orthopedics. Given the severe of left shoulder pain, MRI was obtained with findings as above. Orthopedics consult  appreciated.  Noted a plan to allow out sling for gentle range of movement and ADLs.  To reapply sling for comfort only.  To follow-up with orthopedics Dr. Devonne Doughty within 2 weeks.      Leukocytosis Likely due to to pain and acute distress.  Procalcitonin not elevated.  No evidence of infection.  Not on antibiotics. Recent Labs  Lab 12/31/22 0002 12/31/22 0856 12/31/22 0900 01/01/23 0707  WBC 15.8*  --  11.9* 9.3  PROCALCITON  --  <0.10  --   --    Prolonged QT interval Hypokalemia/hypomagnesemia Admission EKG with QTc prolonged up to 559 Potassium and magnesium level checked.   Replacements given with target K >4 and Mg> 2 Avoid QT prolonging medications Repeat EKG. Recent Labs  Lab 12/31/22 0002 12/31/22 0856 01/01/23 0707  K 4.6  --  3.8  MG  --  1.7  --    Anxiety/depression PTA meds- Xanax 0.5 mg daily, Zoloft 100 mg daily, along with clonidine and propranolol  Continue the same  Morbid Obesity  H/o gastric sleeve surgery Body mass index is 46.05 kg/m. Patient has been advised to make an attempt to improve diet and exercise patterns to aid in weight loss.   Goals of care   Code Status: Full Code   Wounds:  -    Discharge Exam:   Vitals:   01/01/23 2119 01/02/23 0025 01/02/23 0026 01/02/23 0346  BP: (!) 163/121 (!) 158/107  Marland Kitchen)  164/101  Pulse: 94 81  76  Resp: 18 18  18   Temp:   98.3 F (36.8 C) 97.7 F (36.5 C)  TempSrc:   Oral Oral  SpO2: 100% 100%  100%  Weight:      Height:        Body mass index is 46.05 kg/m.  General exam: Pleasant, young Caucasian female. Skin: No rashes, lesions or ulcers. HEENT: Atraumatic, normocephalic, no obvious bleeding Lungs: Clear to auscultation bilaterally CVS: Regular rate and rhythm, no murmur GI/Abd soft, nontender, distended from obesity, bowel sound present  CNS: Alert, awake, oriented x 3 Psychiatry: Mood appropriate Extremities: No pedal edema, no calf tenderness.  Left shoulder able to move with  pain  Follow ups:    Follow-up Information     [facility]. Schedule an appointment as soon as possible for a visit.   Specialty: Rehabilitation Contact information: [address] [place] [address] [phone]        Morrell Riddle, PA-C Follow up.   Specialty: Physician Assistant Contact information: [address] [place] [address] [phone]                 Discharge Instructions:   Discharge Instructions     Ambulatory referral to Occupational Therapy   Complete by: As directed    Call MD for:  difficulty breathing, headache or visual disturbances   Complete by: As directed    Call MD for:  extreme fatigue   Complete by: As directed    Call MD for:  hives   Complete by: As directed    Call MD for:  persistant dizziness or light-headedness   Complete by: As directed    Call MD for:  persistant nausea and vomiting   Complete by: As directed    Call MD for:  severe uncontrolled pain   Complete by: As directed    Call MD for:  temperature >100.4   Complete by: As directed    Diet - low sodium heart healthy   Complete by: As directed    Discharge instructions   Complete by: As directed    Recommendations at discharge:   Your blood pressure is uncontrolled.  Spironolactone 25 mg daily has been added to your regimen of blood pressure medicines.  Continue to monitor daily blood pressure at home.  Follow-up with PCP for further adjustment.  Recommend lifestyle changes with diet and exercise for weight loss.  General discharge instructions: Follow with Primary MD Valarie Cones, Dema Severin, PA-C in 7 days  Please request your PCP  to go over your hospital tests, procedures, radiology results at the follow up. Please get your medicines reviewed and adjusted.  Your PCP may decide to repeat certain labs or tests as needed. Do not drive, operate heavy machinery, perform activities at  heights, swimming or participation in water activities or provide baby sitting services if your were admitted for syncope or siezures until you have seen by Primary MD or a Neurologist and advised to do so again. [place] Controlled Substance Reporting System database was reviewed. Do not drive, operate heavy machinery, perform activities at heights, swim, participate in water activities or provide baby-sitting services while on medications for pain, sleep and mood until your outpatient physician has reevaluated you and advised to do so again.  You are strongly recommended to comply with the dose, frequency and duration of prescribed medications. Activity: As tolerated with  Full fall precautions use walker/cane & assistance as needed Avoid using any recreational substances like cigarette, tobacco, alcohol, or non-prescribed drug. If you experience worsening of your admission symptoms, develop shortness of breath, life threatening emergency, suicidal or homicidal thoughts you must seek medical attention immediately by calling 911 or calling your MD immediately  if symptoms less severe. You must read complete instructions/literature along with all the possible adverse reactions/side effects for all the medicines you take and that have been prescribed to you. Take any new medicine only after you have completely understood and accepted all the possible adverse reactions/side effects.  Wear Seat belts while driving. You were cared for by a hospitalist during your hospital stay. If you have any questions about your discharge medications or the care you received while you were in the hospital after you are discharged, you can call the unit and ask to speak with the hospitalist or the covering physician. Once you are discharged, your primary care physician will handle any further medical issues. Please note that NO REFILLS for any discharge medications will be authorized once you are discharged, as it is  imperative that you return to your primary care physician (or establish a relationship with a primary care physician if you do not have one).   Increase activity slowly   Complete by: As directed        Discharge Medications:   Allergies as of 01/02/2023       Reactions   Aspirin Other (See Comments)   Stomach pain Severe abdominal pain   Duloxetine Hcl Other (See Comments)   Blisters, "shut kidneys down"   Lorazepam Other (See Comments)   "Can't remember anything while on it" aggressive    Naproxen Nausea And Vomiting, Other (See Comments)   cramping   Penicillins Hives   Has patient had a PCN reaction causing immediate rash, facial/tongue/throat swelling, SOB or lightheadedness with hypotension: No Has patient had a PCN reaction causing severe rash involving mucus membranes or skin necrosis: No Has patient had a PCN reaction that required hospitalization No Has patient had a PCN reaction occurring within the last 10 years: No If all of the above answers are "NO", then may proceed with Cephalosporin use.   Cephalexin    Unknown reaction   Buspar [buspirone] Other (See Comments)   "felt weird, heart raced, cloudy"   Macrobid [nitrofurantoin Monohyd Macro] Nausea Only, Other (See Comments)   Stomach pain   Sudafed [pseudoephedrine Hcl] Hives   Topiramate Other (See Comments)   Numbness/tingling in mouth/feet        Medication List     STOP taking these medications    furosemide 20 MG tablet Commonly known as: LASIX   meloxicam 7.5 MG tablet Commonly known as: MOBIC       TAKE these medications    ALPRAZolam 0.5 MG 24 hr tablet Commonly known as: XANAX XR Take 1 tablet by mouth daily.   cloNIDine 0.1 MG tablet Commonly known as: Catapres Take 1 tablet (0.1 mg total) by mouth 3 (three) times daily. What changed: how much to take   diphenhydrAMINE 25 MG tablet Commonly known as: BENADRYL Take 25-50 mg by mouth daily as needed for allergies.    hydrochlorothiazide 12.5 MG capsule Commonly known as: MICROZIDE Take 12.5 mg by mouth daily as needed (for edema).   naratriptan 2.5 MG tablet Commonly known as: Amerge Take 1 tablet (2.5 mg total) by mouth as needed for migraine. Take one (1) tablet at onset of  headache; if returns or does not resolve, may repeat after 4 hours; do not exceed five (5) mg in 24 hours.   ondansetron 8 MG disintegrating tablet Commonly known as: ZOFRAN-ODT 8mg  ODT q4 hours prn nausea   promethazine 25 MG tablet Commonly known as: PHENERGAN Take 1 tablet (25 mg total) by mouth every 6 (six) hours as needed for nausea or vomiting.   propranolol ER 120 MG 24 hr capsule Commonly known as: INDERAL LA Take 120 mg by mouth daily.   sertraline 100 MG tablet Commonly known as: ZOLOFT Take 1 tablet by mouth daily.   spironolactone 25 MG tablet Commonly known as: Aldactone Take 1 tablet (25 mg total) by mouth daily.   tiZANidine 4 MG tablet Commonly known as: Zanaflex Take 1 tablet (4 mg total) by mouth every 6 (six) hours as needed for muscle spasms. What changed: when to take this   Vitamin D (Ergocalciferol) 1.25 MG (50000 UNIT) Caps capsule Commonly known as: DRISDOL Take 50,000 Units by mouth 2 (two) times a week. Thursday and Sunday   zonisamide 100 MG capsule Commonly known as: Zonegran Take 1 capsule (100 mg total) by mouth at bedtime.         The results of significant diagnostics from this hospitalization (including imaging, microbiology, ancillary and laboratory) are listed below for reference.    Procedures and Diagnostic Studies:   MR SHOULDER LEFT WO CONTRAST  Result Date: 12/31/2022 CLINICAL DATA:  Shoulder trauma. Rotator cuff tear suspected. Left shoulder pain. EXAM: MRI OF THE LEFT SHOULDER WITHOUT CONTRAST TECHNIQUE: Multiplanar, multisequence MR imaging of the shoulder was performed. No intravenous contrast was administered. COMPARISON:  Left shoulder radiographs  12/23/2022 (multiple studies) FINDINGS: Despite efforts by the technologist and patient, mild motion artifact is present on today's exam and could not be eliminated. This reduces exam sensitivity and specificity. Rotator cuff: There is moderate intermediate T2 signal and thickening of the anterior infraspinatus tendon insertion suggesting tendinosis. Additional linear increased T2 signal suggesting a tear extending through the bursal tendon footprint insertion (coronal series 6, image 13 and sagittal series 10, images 19 and 20). This involves the majority of the transverse dimension of the anterior infraspinatus tendon insertion with likely minimal intact articular sided tendon fibers. Additional horizontal linear increased T2 signal suggesting a high-grade partial-thickness articular sided tear of the adjacent posterior and mid aspect of the supraspinatus tendon fibers in a region measuring up to approximately 1.4 cm in AP dimension (sagittal series 10 images 19 and 20 and coronal images 15 and 16). The subscapularis and teres minor are intact. Muscles: No rotator cuff muscle atrophy, fatty infiltration, or edema. Biceps long head: The intra-articular long head of the biceps tendon is intact. Acromioclavicular Joint: There are mild degenerative changes of the acromioclavicular joint including joint space narrowing and peripheral osteophytosis. Type II acromion. Mild fluid within the subacromial/subdeltoid bursa. Glenohumeral Joint: Mild-to-moderate glenohumeral cartilage thinning. Small joint effusion likely extending through a defect within the inferior joint capsule, secondary to the recent glenohumeral dislocation. Labrum: Grossly intact, but evaluation is limited by lack of intraarticular fluid. Bones: There is high-grade marrow edema within the anterior greater tuberosity bordering the bicipital groove (axial series 5 images 8 through 13) with at least two curvilinear fracture lines and up to approximately 4  mm lateral cortical depression. No bony Bankart fracture/lesion is visualized. No definite bony Bankart lesion is seen. On the recent 12/23/2022 radiographs demonstrating left shoulder dislocation, no standard AP or transscapular Y-view was provided (likely  due to decreased patient mobility), and the images are actually compatible with a more rare inferior shoulder dislocation. That would also be consistent with the anterior superolateral humeral head contusion and fracture. Other: None. IMPRESSION: 1. Moderate anterior infraspinatus tendinosis with a high-grade partial-thickness horizontal linear bursal sided tear of the anterior tendon footprint insertion. 2. High-grade partial-thickness horizontal linear articular sided tear of the adjacent posterior and mid aspect of the supraspinatus tendon footprint. 3. High-grade marrow edema within the anterior greater tuberosity bordering the bicipital groove with at least two curvilinear fracture lines and up to approximately 4 mm lateral cortical depression. This marrow edema would be compatible with a direct contusion, possibly from inferior shoulder dislocation. Recommend correlation with patient trauma history. 4. Small joint effusion likely extending through a defect within the inferior joint capsule, secondary to the recent glenohumeral dislocation. Electronically Signed   By: Neita Garnet M.D.   On: 12/31/2022 20:27   EEG adult  Result Date: 12/31/2022 Charlsie Quest, MD     12/31/2022 10:57 AM Patient Name: NGA RABON MRN: [id number] Epilepsy Attending: Charlsie Quest Referring Physician/Provider: Caryl Pina, MD Date: 12/31/2022 Duration: 22.37 mins Patient history:  42 y.o. female with a PMHx of anxiety, depression, ADHD, prior sleeve gastrectomy, pseudotumor cerebri and migraine headaches who presents to the ED with acute onset of visual blurring, severe headache and malignant HTN. MRI brain reveals findings most consistent with PRES. EEG to  evaluate for seizure Level of alertness: Awake AEDs during EEG study: None Technical aspects: This EEG study was done with scalp electrodes positioned according to the 10-20 International system of electrode placement. Electrical activity was reviewed with band pass filter of 1-70Hz , sensitivity of 7 uV/mm, display speed of 10mm/sec with a 60Hz  notched filter applied as appropriate. EEG data were recorded continuously and digitally stored.  Video monitoring was available and reviewed as appropriate. Description: The posterior dominant rhythm consists of 9 Hz activity of moderate voltage (25-35 uV) seen predominantly in posterior head regions, symmetric and reactive to eye opening and eye closing. Hyperventilation and photic stimulation were not performed.   IMPRESSION: This study is within normal limits. No seizures or epileptiform discharges were seen throughout the recording. A normal interictal EEG does not exclude the diagnosis of epilepsy. Charlsie Quest   DG Chest 2 View  Result Date: 12/31/2022 CLINICAL DATA:  42 year old female with history of edema on neck CT. EXAM: CHEST - 2 VIEW COMPARISON:  Chest x-ray 10/22/2007. FINDINGS: Lung volumes are low. No consolidative airspace disease. No pleural effusions. No pneumothorax. No pulmonary nodule or mass noted. Pulmonary vasculature and the cardiomediastinal silhouette are within normal limits. IMPRESSION: Low lung volumes without radiographic evidence of acute cardiopulmonary disease. Electronically Signed   By: Trudie Reed M.D.   On: 12/31/2022 06:02   MR BRAIN WO CONTRAST  Result Date: 12/31/2022 CLINICAL DATA:  Headache and high blood pressure. Patient did not take blood pressure medication today EXAM: MRI HEAD WITHOUT CONTRAST TECHNIQUE: Multiplanar, multiecho pulse sequences of the brain and surrounding structures were obtained without intravenous contrast. COMPARISON:  CTA of the head neck from earlier today FINDINGS: Brain: Patchy T2  hyperintensity along the cortical and subcortical regions of the bilateral posterior frontal, parietal, and especially occipital lobes without restricted diffusion or hemorrhage. No hydrocephalus or masslike finding. Vascular: Major flow voids are preserved. Patent dural sinuses on prior CTA. Skull and upper cervical spine: Normal marrow signal. Sinuses/Orbits: Negative. IMPRESSION: Patchy edematous appearance along the superior and posterior  aspect of the cerebral hemispheres, consistent with posterior reversible encephalopathy syndrome. No complicating hemorrhage or completed infarct. Electronically Signed   By: Tiburcio Pea M.D.   On: 12/31/2022 05:01   CT ANGIO HEAD NECK W WO CM  Result Date: 12/31/2022 CLINICAL DATA:  Headache and dizziness EXAM: CT ANGIOGRAPHY HEAD AND NECK WITH AND WITHOUT CONTRAST TECHNIQUE: Multidetector CT imaging of the head and neck was performed using the standard protocol during bolus administration of intravenous contrast. Multiplanar CT image reconstructions and MIPs were obtained to evaluate the vascular anatomy. Carotid stenosis measurements (when applicable) are obtained utilizing NASCET criteria, using the distal internal carotid diameter as the denominator. RADIATION DOSE REDUCTION: This exam was performed according to the departmental dose-optimization program which includes automated exposure control, adjustment of the mA and/or kV according to patient size and/or use of iterative reconstruction technique. CONTRAST:  70mL OMNIPAQUE IOHEXOL 350 MG/ML SOLN COMPARISON:  None Available. FINDINGS: CT HEAD FINDINGS Brain: No mass, hemorrhage or extra-axial collection. CSF spaces are normal. Normal appearance of the brain parenchyma. Skull: The visualized skull base, calvarium and extracranial soft tissues are normal. Sinuses/Orbits: No fluid levels or advanced mucosal thickening of the visualized paranasal sinuses. No mastoid or middle ear effusion. Normal orbits. CTA NECK  FINDINGS SKELETON: No acute abnormality or high grade bony spinal canal stenosis. OTHER NECK: Normal pharynx, larynx and major salivary glands. No cervical lymphadenopathy. Unremarkable thyroid gland. UPPER CHEST: Small pleural effusions and mild pulmonary edema. AORTIC ARCH: There is no calcific atherosclerosis of the aortic arch. Normal variant aortic arch branching pattern with the brachiocephalic and left common carotid arteries sharing a common origin. RIGHT CAROTID SYSTEM: Normal without aneurysm, dissection or stenosis. LEFT CAROTID SYSTEM: Normal without aneurysm, dissection or stenosis. VERTEBRAL ARTERIES: Left dominant configuration. There is no dissection, occlusion or flow-limiting stenosis to the skull base (V1-V3 segments). CTA HEAD FINDINGS POSTERIOR CIRCULATION: --Vertebral arteries: Normal V4 segments. --Inferior cerebellar arteries: Normal. --Basilar artery: Normal. --Superior cerebellar arteries: Normal. --Posterior cerebral arteries (PCA): Normal. ANTERIOR CIRCULATION: --Intracranial internal carotid arteries: Normal. --Anterior cerebral arteries (ACA): Normal. --Middle cerebral arteries (MCA): Normal. VENOUS SINUSES: As permitted by contrast timing, patent. ANATOMIC VARIANTS: Fetal origin of the right posterior cerebral artery. Review of the MIP images confirms the above findings. IMPRESSION: 1. No emergent large vessel occlusion or high-grade stenosis of the intracranial arteries. 2. Small pleural effusions and mild pulmonary edema. Electronically Signed   By: Deatra Robinson M.D.   On: 12/31/2022 02:50     Labs:   Basic Metabolic Panel: Recent Labs  Lab 12/31/22 0002 12/31/22 0856 01/01/23 0707  NA 137  --  139  K 4.6  --  3.8  CL 105  --  107  CO2 21*  --  21*  GLUCOSE 146*  --  90  BUN 12  --  13  CREATININE 0.94  --  1.00  CALCIUM 9.4  --  8.9  MG  --  1.7  --    GFR Estimated Creatinine Clearance: 94.3 mL/min (by C-G formula based on SCr of 1 mg/dL). Liver Function  Tests: Recent Labs  Lab 12/31/22 0002  AST 28  ALT 41  ALKPHOS 98  BILITOT 0.6  PROT 7.3  ALBUMIN 3.7   Recent Labs  Lab 12/31/22 0002  LIPASE 21   No results for input(s): "AMMONIA" in the last 168 hours. Coagulation profile No results for input(s): "INR", "PROTIME" in the last 168 hours.  CBC: Recent Labs  Lab 12/31/22 0002 12/31/22 0900  01/01/23 0707  WBC 15.8* 11.9* 9.3  NEUTROABS  --  8.8*  --   HGB 15.2* 12.4 12.5  HCT 45.5 37.9 36.6  MCV 94.8 97.4 95.1  PLT 504* 410* 324   Cardiac Enzymes: No results for input(s): "CKTOTAL", "CKMB", "CKMBINDEX", "TROPONINI" in the last 168 hours. BNP: Invalid input(s): "POCBNP" CBG: No results for input(s): "GLUCAP" in the last 168 hours. D-Dimer No results for input(s): "DDIMER" in the last 72 hours. Hgb A1c Recent Labs    12/31/22 0900  HGBA1C 5.4   Lipid Profile No results for input(s): "CHOL", "HDL", "LDLCALC", "TRIG", "CHOLHDL", "LDLDIRECT" in the last 72 hours. Thyroid function studies Recent Labs    12/31/22 0856  TSH 2.744   Anemia work up No results for input(s): "VITAMINB12", "FOLATE", "FERRITIN", "TIBC", "IRON", "RETICCTPCT" in the last 72 hours. Microbiology No results found for this or any previous visit (from the past 240 hour(s)).  Time coordinating discharge: 45 minutes  Signed: Tiauna Whisnant  [facility] 01/02/2023, 11:38 AM

## 2023-01-21 ENCOUNTER — Ambulatory Visit: Payer: Managed Care, Other (non HMO) | Admitting: Occupational Therapy

## 2023-01-21 ENCOUNTER — Other Ambulatory Visit: Payer: Self-pay

## 2023-01-21 ENCOUNTER — Encounter: Payer: Self-pay | Admitting: Occupational Therapy

## 2023-01-21 DIAGNOSIS — M6281 Muscle weakness (generalized): Secondary | ICD-10-CM

## 2023-01-21 NOTE — Therapy (Deleted)
[facility] [address] [place], [place], [address] Phone: [phone]   Fax:  [phone]   Patient Details  Name: [name] MRN: [id number] Date of Birth: March 05, 1980   Encounter Date: 01/21/2023   OT Rehab Visit, Pt arrived No Charge   REFERRING DIAG: Z87.39 (ICD-10-CM) - History of closed shoulder dislocation    REFERRING PROVIDER: Lorin Glass, MD    SUBJECTIVE STATEMENT: 01/21/23 - Pt 4 weeks s/p left anterior shoulder dislocation injury.   Pt reported taking hydrocodone "Norco" (7.5 mg tablet) and therefore OT updated medication list.   Pt reports L shoulder is "awful."   Pt reported already seeing PT for L shoulder dislocation at [facility].    Pt accompanied by: self and spouse: Kathlene November   Today: Please disregard previous 01/21/23 Outpatient Rehab OT Canceled (Provider) note by Carilyn Goodpasture, OTR/L. OT began note prior to termination of eval today and then today's visit was canceled (arrived, no charge).   Pt arrived for OT eval today. When gathering subjective information (see above), pt reported already seeing PT at an [facility] location for L shoulder dislocation. To avoid duplication of services, OT evaluation was terminated with pt arrived, no charge. OT educated pt about avoiding duplicate services for same referral dx, and pt and pt's spouse verbalized understanding. OT stated intent to call pt back after confirming with supervisor about duplicate services concern. OT provided pt with contact phone number of [facility] for any questions/concerns in the future. Pt and pt's spouse then left clinic.   After confirming with clinic supervisor, OT called pt back at listed mobile phone number. OT educated pt on the following: It is best to seek care at one location at this time because 1) There could be an issue with duplicating services, and insurance may not be willing to  pay for both. 2) There may be concerns about multiple clinicians treating the same condition, which may impact outcomes. Pt acknowledged understanding of all.     Wynetta Emery, OT 01/21/2023, 3:23 PM   [facility] [address], [address] [place], [place], [address] Phone: [phone]   Fax:  [phone]

## 2023-01-21 NOTE — Therapy (Unsigned)
OUTPATIENT OCCUPATIONAL THERAPY ORTHO EVALUATION  Patient Name: [name] MRN: [id number] DOB:01/16/1981, 42 y.o., female Today's Date: 01/21/2023  PCP: Morrell Riddle, PA-C  REFERRING PROVIDER: Lorin Glass, MD   END OF SESSION:   Past Medical History:  Diagnosis Date   ADHD (attention deficit hyperactivity disorder)    ADHD, adult residual type    Anxiety    Depression    Family history of adverse reaction to anesthesia    sister has PONV   GERD (gastroesophageal reflux disease)    Headache    otc med prn - last one 2 months ago   New onset of congestive heart failure (HCC) 12/31/2022   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   Pseudotumor cerebri    Past Surgical History:  Procedure Laterality Date   ABDOMINAL HYSTERECTOMY     CESAREAN SECTION     x 2   CYSTOSCOPY N/A 06/19/2015   Procedure: CYSTOSCOPY;  Surgeon: Jaymes Graff, MD;  Location: [facility];  Service: Gynecology;  Laterality: N/A;   GASTRECTOMY     sleeve, [facility]   IR GENERIC HISTORICAL  05/12/2016   IR FLUORO GUIDED NEEDLE PLC ASPIRATION/INJECTION LOC 05/12/2016 Paulina Fusi, MD [facility]-INTERV [facility]   LAPAROSCOPIC VAGINAL HYSTERECTOMY WITH SALPINGECTOMY Bilateral 06/19/2015   Procedure: LAPAROSCOPIC ASSISTED VAGINAL HYSTERECTOMY WITH SALPINGECTOMY;  Surgeon: Jaymes Graff, MD;  Location: [facility];  Service: Gynecology;  Laterality: Bilateral;   lumbar pucture     x2   SACROILIAC JOINT FUSION Left 10/28/2017   Procedure: SACROILIAC JOINT FUSION;  Surgeon: Venita Lick, MD;  Location: [facility] OR;  Service: Orthopedics;  Laterality: Left;  90 mins   TONSILLECTOMY     Patient Active Problem List   Diagnosis Date Noted   PRES (posterior reversible encephalopathy syndrome) 12/31/2022   Prolonged QT interval 12/31/2022   New onset of congestive heart failure (HCC) 12/31/2022   Elevated troponin 12/31/2022   Leukocytosis 12/31/2022   Polycythemia 12/31/2022   Thrombocytosis 12/31/2022   History of closed  shoulder dislocation 12/31/2022   Hypotension 12/31/2022   Obesity, Class III, BMI 40-49.9 (morbid obesity) (HCC) 12/31/2022   Dysmenorrhea 12/30/2022   Anxiety 04/18/2021   Chronic migraine w/o aura w/o status migrainosus, not intractable 02/14/2021   Long term prescription benzodiazepine use 11/01/2018   Iron deficiency 02/17/2018   S/P lumbar fusion 10/28/2017   Chronic pain syndrome 06/05/2017   Headache 03/10/2017   Low back pain 03/09/2017   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   Intractable chronic migraine without aura and with status migrainosus 05/09/2016   Pseudotumor cerebri 05/06/2016   Obesity 05/06/2016   Chronic migraine 05/06/2016   Attention deficit hyperactivity disorder (ADHD), combined type 04/15/2016   GAD (generalized anxiety disorder) 04/15/2016   History of partial hysterectomy 04/15/2016   Dyspnea 06/23/2015   Edema of foot 06/23/2015   Pelvic pain in female 06/19/2015    ONSET DATE: 01/02/23 (referral date), 12/23/22 (date of left anterior shoulder dislocation)  REFERRING DIAG: Z87.39 (ICD-10-CM) - History of closed shoulder dislocation   THERAPY DIAG:  No diagnosis found.  Rationale for Evaluation and Treatment: Rehabilitation  SUBJECTIVE:   SUBJECTIVE STATEMENT: 01/21/23 - Pt 4 weeks s/p left anterior shoulder dislocation injury.  Pt reported hydrocodone (7.5 mg tablet) and therefore OT updated medication list.  Pt reports L shoulder is "awful"  Pt accompanied by: self and husband: [name]  PERTINENT HISTORY: Per 01/02/23 MD D/C Summary: hx of uncontrolled BP, morbid obesity, pseudotumor cerebri, chronic headaches, anxiety/depression, ADHD, GERD, prior sleeve  gastrectomy   Per 01/01/23 acute OT Evaluation: L shoulder MRI demonstrated partial-thickness tears of anterior infraspinatus tendinosis and supraspinatus tendon.   Per 01/02/23 MD D/C Summary: 10/29, patient presented to the ED with complaint of blurry vision and confusion. She  reported having a migraine headache earlier than the day with associated nausea and transient blurry vision that is not uncommon. She had taken a nap and later on that evening got up to take a shower.  Husband reports that as she was walking into the shower she became confused, reported that she was unable to see, and was talking out of her head.  Reported that she did not know who they were thereafter passed out. EMS noted significant elevated blood pressure to over 240 systolic.... Of note, on 10/22, one day prior to this presentation, patient had a fall. She was seen in the ED, found to have left anterior shoulder dislocation. She underwent closed reduction in the ED and was discharged home to follow-up with orthopedics as an outpatient. On further questioning about the event, patient seems to have amnesia around the time of fall last week which raise suspicion of an unwitnessed seizure per neurology note.    PRECAUTIONS: {Therapy precautions:24002}Fall, ?sling (for comfort and joint protection) per 01/02/23 acute OT Progress Note ***  RED FLAGS: {PT Red Flags:29287}   WEIGHT BEARING RESTRICTIONS: {Yes ***/No:24003}No  PAIN:  Are you having pain? {OPRCPAIN:27236}  FALLS: Has patient fallen in last 6 months? Yes. Number of falls ***12/23/22 Fall resulting in L anterior shoulder dislocation  LIVING ENVIRONMENT: *** Per 01/01/23 acute OT evaluation: Pt is indep and lives with family at baseline.   PLOF: {PLOF:24004}  PATIENT GOALS: ***  NEXT MD VISIT: ***  OBJECTIVE:  Note: Objective measures were completed at Evaluation unless otherwise noted.  HAND DOMINANCE: {MISC; OT HAND DOMINANCE:[phone]}  ADLs: Overall ADLs: *** Transfers/ambulation related to ADLs: Eating: *** Grooming: *** UB Dressing: *** LB Dressing: *** Toileting: *** Bathing: *** Tub Shower transfers: *** Equipment: {equipment:25573}  FUNCTIONAL OUTCOME MEASURES: {OTFUNCTIONALMEASURES:27238}  UPPER EXTREMITY  ROM:     {AROM/PROM:27142} ROM Right eval Left eval  Shoulder flexion    Shoulder abduction    Shoulder adduction    Shoulder extension    Shoulder internal rotation    Shoulder external rotation    Elbow flexion    Elbow extension    Wrist flexion    Wrist extension    Wrist ulnar deviation    Wrist radial deviation    Wrist pronation    Wrist supination    (Blank rows = not tested)  {AROM/PROM:27142} ROM Right eval Left eval  Thumb MCP (0-60)    Thumb IP (0-80)    Thumb Radial abd/add (0-55)     Thumb Palmar abd/add (0-45)     Thumb Opposition to Small Finger     Index MCP (0-90)     Index PIP (0-100)     Index DIP (0-70)      Long MCP (0-90)      Long PIP (0-100)      Long DIP (0-70)      Ring MCP (0-90)      Ring PIP (0-100)      Ring DIP (0-70)      Little MCP (0-90)      Little PIP (0-100)      Little DIP (0-70)      (Blank rows = not tested)   UPPER EXTREMITY MMT:     MMT Right eval  Left eval  Shoulder flexion    Shoulder abduction    Shoulder adduction    Shoulder extension    Shoulder internal rotation    Shoulder external rotation    Middle trapezius    Lower trapezius    Elbow flexion    Elbow extension    Wrist flexion    Wrist extension    Wrist ulnar deviation    Wrist radial deviation    Wrist pronation    Wrist supination    (Blank rows = not tested)  HAND FUNCTION: {handfunction:27230}  COORDINATION: {otcoordination:27237}  SENSATION: {sensation:27233}  EDEMA: ***  COGNITION: Overall cognitive status: {cognition:24006} Areas of impairment: {impairedcognition:27234}  OBSERVATIONS: ***   TODAY'S TREATMENT:                                                                                                                              DATE: ***    PATIENT EDUCATION: Education details: *** Person educated: {Person educated:25204} Education method: {Education Method:25205} Education comprehension: {Education  Comprehension:25206}  HOME EXERCISE PROGRAM: ***  GOALS: Goals reviewed with patient? {yes/no:20286}  SHORT TERM GOALS: Target date: ***  *** Baseline: Goal status: INITIAL  2.  *** Baseline:  Goal status: INITIAL  3.  *** Baseline:  Goal status: INITIAL  4.  *** Baseline:  Goal status: INITIAL  5.  *** Baseline:  Goal status: INITIAL  6.  *** Baseline:  Goal status: INITIAL  LONG TERM GOALS: Target date: ***  *** Baseline:  Goal status: INITIAL  2.  *** Baseline:  Goal status: INITIAL  3.  *** Baseline:  Goal status: INITIAL  4.  *** Baseline:  Goal status: INITIAL  5.  *** Baseline:  Goal status: INITIAL  6.  *** Baseline:  Goal status: INITIAL  ASSESSMENT:  CLINICAL IMPRESSION: Patient is a *** y.o. *** who was seen today for occupational therapy evaluation for ***.   PERFORMANCE DEFICITS: in functional skills including {OT physical skills:25468}, cognitive skills including {OT cognitive skills:25469}, and psychosocial skills including {OT psychosocial skills:25470}.   IMPAIRMENTS: are limiting patient from {OT performance deficits:25471}.   COMORBIDITIES: {Comorbidities:25485} that affects occupational performance. Patient will benefit from skilled OT to address above impairments and improve overall function.  MODIFICATION OR ASSISTANCE TO COMPLETE EVALUATION: {OT modification:25474}  OT OCCUPATIONAL PROFILE AND HISTORY: {OT PROFILE AND HISTORY:25484}  CLINICAL DECISION MAKING: {OT CDM:25475}  REHAB POTENTIAL: {rehabpotential:25112}  EVALUATION COMPLEXITY: {Evaluation complexity:25115}      PLAN:  OT FREQUENCY: {rehab frequency:25116}  OT DURATION: {rehab duration:25117}  PLANNED INTERVENTIONS: {OT Interventions:25467}  RECOMMENDED OTHER SERVICES: ***  CONSULTED AND AGREED WITH PLAN OF CARE: {ZOX:09604}  PLAN FOR NEXT SESSION: ***   Wynetta Emery, OT 01/21/2023, 8:04 AM

## 2023-01-22 NOTE — Therapy (Signed)
[facility] [address] [place], [place], [address] Phone: [phone]   Fax:  [phone]   Patient Details  Name: [name] MRN: [id number] Date of Birth: March 05, 1980   Encounter Date: 01/21/2023   OT Rehab Visit, Pt arrived No Charge   REFERRING DIAG: Z87.39 (ICD-10-CM) - History of closed shoulder dislocation    REFERRING PROVIDER: Lorin Glass, MD    SUBJECTIVE STATEMENT: 01/21/23 - Pt 4 weeks s/p left anterior shoulder dislocation injury.   Pt reported taking hydrocodone "Norco" (7.5 mg tablet) and therefore OT updated medication list.   Pt reports L shoulder is "awful."   Pt reported already seeing PT for L shoulder dislocation at [facility].    Pt accompanied by: self and spouse: Kathlene November   Today: Please disregard previous 01/21/23 Outpatient Rehab OT Canceled (Provider) note by Carilyn Goodpasture, OTR/L. OT began note prior to termination of eval today and then today's visit was canceled (arrived, no charge).   Pt arrived for OT eval today. When gathering subjective information (see above), pt reported already seeing PT at an [facility] location for L shoulder dislocation. To avoid duplication of services, OT evaluation was terminated with pt arrived, no charge. OT educated pt about avoiding duplicate services for same referral dx, and pt and pt's spouse verbalized understanding. OT stated intent to call pt back after confirming with supervisor about duplicate services concern. OT provided pt with contact phone number of [facility] for any questions/concerns in the future. Pt and pt's spouse then left clinic.   After confirming with clinic supervisor, OT called pt back at listed mobile phone number. OT educated pt on the following: It is best to seek care at one location at this time because 1) There could be an issue with duplicating services, and insurance may not be willing to  pay for both. 2) There may be concerns about multiple clinicians treating the same condition, which may impact outcomes. Pt acknowledged understanding of all.     Wynetta Emery, OT 01/21/2023, 3:23 PM   [facility] [address], [address] [place], [place], [address] Phone: [phone]   Fax:  [phone]

## 2023-02-09 ENCOUNTER — Telehealth: Payer: Self-pay | Admitting: Neurology

## 2023-02-09 DIAGNOSIS — I6783 Posterior reversible encephalopathy syndrome: Secondary | ICD-10-CM

## 2023-02-09 NOTE — Telephone Encounter (Signed)
Call to patient, no answer. Left message to return call.

## 2023-02-09 NOTE — Telephone Encounter (Signed)
Pt states that on Oct 30th her blood pressure was very high, so much so that she had an episode where she could not recall who she was. Pt states she was diagnosed with PRES by the hospital, she had a seizure , fell and dislocated her shoulder.  Pt is asking for a call re: what Sarah, NP would suggest at this point.  Pt reports that her pcp is very concerned.

## 2023-02-09 NOTE — Telephone Encounter (Signed)
I reviewed hospital visit admitted 12/31/2022 with acute onset of visual blurring, severe headache, hypertension.  MRI was consistent with PRES. of note, patient was also in the ER 12/23/2022 she sustained a fall with left anterior shoulder dislocation.  Raise concern for unwitnessed seizure.  EEG was normal. Dr. Derry Lory saw the patient 01/01/2023 recommended aim for normotension, follow-up with Dr. Terrace Arabia, felt presenting symptoms most likely secondary to PRES rather than pseudotumor.  I last saw her 11/18/2022, we started Emgality, continue Zonegran 100 mg at bedtime, prescribed Nurtec as needed.  Recommended follow-up with ophthalmology.  Of note, BP 107/74 at that visit.   MRI of the brain IMPRESSION: Patchy edematous appearance along the superior and posterior aspect of the cerebral hemispheres, consistent with posterior reversible encephalopathy syndrome. No complicating hemorrhage or completed infarct.  CTA head and neck IMPRESSION: 1. No emergent large vessel occlusion or high-grade stenosis of the intracranial arteries. 2. Small pleural effusions and mild pulmonary edema.  Had elevated BNP and troponin.  Looks like she saw her PCP today, BP was 124/90 on nicardipine.  Need to get patient in with Dr. Terrace Arabia for new patient visit.

## 2023-02-10 NOTE — Telephone Encounter (Signed)
Please call to schedule for 1st available with me or Dr. Terrace Arabia. I will order MRI brain and EEG. Her creatinine was 0.8 02/02/23.   Orders Placed This Encounter  Procedures   MR BRAIN W WO CONTRAST   EEG adult

## 2023-02-10 NOTE — Telephone Encounter (Signed)
Call to patient to work in next available for Dr. Terrace Arabia or Maralyn Sago and advise of orders that were placed. No answer. Left message to return call

## 2023-02-10 NOTE — Telephone Encounter (Signed)
Vitals:    12/31/22 0300 12/31/22 0301 12/31/22 0400 12/31/22 0641  BP: (!) 140/91   (!) 154/104 (!) 141/125  Pulse: 67   (!) 57 90  Resp: (!) 23   17 (!) 26  Temp:   98.1 F (36.7 C)       PRES DUE TO  Hypertension Acute.  Patient presented with complaints of headache and elevated blood pressure.  Blood pressure initially elevated up to 174/123.  CT angiogram of the head showed no acute abnormality, but subsequent MRI of the brain noted patchy edematous appearance along the superior and posterior aspect of the cerebral hemispheres.  Findings consistent with posterior reversible encephalopathy syndrome.  Patient had been given chronic pain 0.2 mg p.o., labetalol 20 mg IV, and Lasix IV.  Since that time systolic blood pressures have ranged from the 60s to 80s. -Admit to a telemetry bed -Neurochecks -Check TSH -Hold propranolol and clonidine due to subsequent hypotension. -Hydralazine IV as needed for elevated blood pressures -Appreciate neurology consultative services we will follow-up for any further recommendations   New onset congestive heart failure exacerbation Acute.  BNP elevated at 1414.4.  CT a of the head and neck noting small pleural effusions with mild pulmonary edema.  Patient has been given Lasix 20 mg IV. -Strict I&Os and daily weights -Check echocardiogram -Hold IV diuresis at this time due to hypotension -Consider need to formally consult cardiology in a.m. based off of heart function

## 2023-02-10 NOTE — Addendum Note (Signed)
Addended by: Glean Salvo on: 02/10/2023 04:40 PM   Modules accepted: Orders

## 2023-02-11 NOTE — Telephone Encounter (Signed)
lmtrc

## 2023-02-11 NOTE — Telephone Encounter (Signed)
Called pt and scheduled EEG for 02/12/23 at 9:30 and scheduled pt to see Sarah for 02/18/23 at 7:45am

## 2023-02-12 ENCOUNTER — Telehealth: Payer: Self-pay | Admitting: Neurology

## 2023-02-12 ENCOUNTER — Ambulatory Visit (INDEPENDENT_AMBULATORY_CARE_PROVIDER_SITE_OTHER): Payer: Commercial Managed Care - HMO | Admitting: Neurology

## 2023-02-12 DIAGNOSIS — I6783 Posterior reversible encephalopathy syndrome: Secondary | ICD-10-CM

## 2023-02-12 DIAGNOSIS — R4182 Altered mental status, unspecified: Secondary | ICD-10-CM | POA: Diagnosis not present

## 2023-02-12 NOTE — Telephone Encounter (Signed)
sent to GI they obtain [employer] auth [phone] 

## 2023-02-17 NOTE — Progress Notes (Unsigned)
Patient: [name] Date of Birth: 1981/01/06  Reason for Visit: Follow up History from: Patient, husband Kathlene November  Primary Neurologist: [facility]ace [facility]  ASSESSMENT AND PLAN 42 y.o. year old female   1.  Posterior reversible encephalopathy syndrome 2.  Hypertensive encephalopathy 3.  History of pseudotumor cerebri 4.  Chronic migraine headaches  -Overall, looks very well.  Seems to be back at baseline. Migraines under very good control. -Repeat MRI of the brain with and without contrast scheduled 03/06/2023 -Repeat EEG, has been completed pending result -Discussed the importance of strict management of blood pressure, is monitoring several times throughout the day -Recommend follow-up with ophthalmology with history of IH -Avoid triptan due to uncontrolled hypertension.  I ordered Nurtec 75 mg tablet as needed for severe headache.  Try Tylenol first. -Will continue Zonegran 100 mg at bedtime for migraine prevention -Closely monitor symptoms, keep appointment in March 2025 with me  HISTORY  [name] is a 42 years old right-handed female, accompanied by her husband [name], seen in refer by her primary care physician nurse practitioner [name] for evaluation of pseudotumor cerebri, initial evaluation was on May 06 2016.   I saw her in 2011, She had a history of hypertension during pregnancy, postpartum depression, frequent headaches postpartum day, there was mild evidence of papillary edema on examination, she was referred for CT head without contrast that was normal in December 2011, lumbar puncture on February 06 2010, patient reported elevated open pressure 44 cm water, she later developed difficulty urination, low back pain, had MRI of lumbar on February 08 2010, that was normal.   I reviewed and summarized the referring note, reported a history of ADHD since age 50, only tolerate Adderall extended release, anxiety, currently taking Wellbutrin 300 mg daily, recently started on  BuSpar 15 mg twice a day, Klonopin 1 mg twice a day as needed, also taking instant release Adderall 5 mg one tablet in the afternoon   She  lost insurance has lost follow-up since 2011, today she came in complains of gradual worsening headaches since 2016, now she has daily left occipital headache, spreading forward to become left retro-orbital area severe pounding headache with associated light noise sensitivity, nauseous, she went to emergency room about 6 times over the past 12 months for headache treatment, she also had steady weight gain, complains of transient difficulty focusing with sudden body positional change. She denies tinnitus, no hearing loss.  UPDATE Jul 07 2016:YY She had a fluoroscopy guided lumbar puncture on May 07 2016, open pressure was 23 cm water, closing pressure was 12 cm water.   Post LP she developed severe positional related headache, require hospital admission, was treated with Morphine as needed, also received scheduled Percocet, Fioricet. She eventually had a blood patch on March twelfth 2018.   She could not tolerate Topamax, she complains of bilateral feet neuropathic pain, numbness tingling, also complains of alternating of the taste especially with soda, and her mouth feel raw, she could not tolerate it, she was put on Zonegran 100 mg twice a day since and of March 2018, she could not tolerate it better, she noticed improvement of her headaches.   Over the past 2 months, on average he has one or two headache each week, left-sided lateralized severe pounding headache with associated light noise sensitivity, nauseous, she is now taking Maxalt 10 mg, sometimes Fioricet as needed, this will usually take care of the headache within one hour.   she is planning on to  have bariatric surgery soon. have bariatric evaluation Jul 28 2016.   Update November 18, 2022 SS:  Patient was in the ER 11/08/22 for migraine headache, given migraine cocktail with good improvement.       Remains on Zonegran 100 mg at bedtime, Amerge provided brief relief, would return. Having 3-4 migraines a month, can last 1-2 days. Summer is worse. I sent Nurtec, she didn't pick it up yet. Has not seen her eye doctor. Weight is up 13 lbs, not a candidate for injectable medications, A1C is perfect. Migraine aura of yawning, usually right sided occipital, spreads forward, behind right eye, has nausea, sensitive to sound. Really can't distinguish IIH vs migraine headaches, IIH was so long ago.   Update February 18, 2023 SS: On 12/30/2022 went to the ER reporting blurry vision and confusion, migraine earlier that day with typical nausea and blurry vision. Took Maxalt.  Later that evening she became confused worsening vision changes, passed out.  EMS reported blood pressure over 240 systolic.  170s in the ER.  CT angio of head did not show any emergent large vessel occlusion or high-grade stenosis.  MRI of the brain showed patchy edematous appearance along the superior and posterior aspect of the cerebral hemispheres consistent with PRES  Labs showed WBC count 15.8, hemoglobin 15.2, glucose 146,  troponin 65> 67, BNP over 1100  Patient was given IV Toradol, IV Benadryl, oral clonidine, IV labetalol and IV Lasix.  On 12/23/2022 she had a fall with left anterior shoulder dislocation.  With the above, now suspicious for unwitnessed seizure. She had fallen asleep in the chair watching TV.   Scheduled for repeat MRI of the brain 03/06/2023.  Repeat EEG has been ordered.  Working with PCP for BP regulation, taking Cardene, clonidine, propranolol, spironolactone. Clonidine and propranolol have been chronic for anxiety. BP good today 128/86.   Migraines have been really well. Never started [employer], insurance wouldn't cover that or Nurtec. Has only been taking Zonegran.   Confusion has resolved. Feels doing better. PCP has tried to get several weight loss drugs covered without successful. Doing PT for the  left shoulder, unclear if surgery is needed.   REVIEW OF SYSTEMS: Out of a complete 14 system review of symptoms, the patient complains only of the following symptoms, and all other reviewed systems are negative.  See HPI  ALLERGIES: Allergies  Allergen Reactions   Aspirin Other (See Comments)    Stomach pain Severe abdominal pain   Duloxetine Hcl Other (See Comments)    Blisters, "shut kidneys down"   Lorazepam Other (See Comments)    "Can't remember anything while on it" aggressive    Naproxen Nausea And Vomiting and Other (See Comments)    cramping   Penicillins Hives    Has patient had a PCN reaction causing immediate rash, facial/tongue/throat swelling, SOB or lightheadedness with hypotension: No Has patient had a PCN reaction causing severe rash involving mucus membranes or skin necrosis: No Has patient had a PCN reaction that required hospitalization No Has patient had a PCN reaction occurring within the last 10 years: No If all of the above answers are "NO", then may proceed with Cephalosporin use.   Cephalexin     Unknown reaction   Buspar [Buspirone] Other (See Comments)    "felt weird, heart raced, cloudy"   Macrobid [Nitrofurantoin Monohyd Macro] Nausea Only and Other (See Comments)    Stomach pain   Sudafed [Pseudoephedrine Hcl] Hives   Topiramate Other (See Comments)  Numbness/tingling in mouth/feet    HOME MEDICATIONS: Outpatient Medications Prior to Visit  Medication Sig Dispense Refill   ALPRAZolam (XANAX XR) 0.5 MG 24 hr tablet Take 1 tablet by mouth daily.     cloNIDine (CATAPRES) 0.1 MG tablet Take 1 tablet (0.1 mg total) by mouth 3 (three) times daily. (Patient taking differently: Take 0.1-0.3 mg by mouth 3 (three) times daily.) 90 tablet 0   diphenhydrAMINE (BENADRYL) 25 MG tablet Take 25-50 mg by mouth daily as needed for allergies.     hydrochlorothiazide (MICROZIDE) 12.5 MG capsule Take 12.5 mg by mouth daily as needed (for edema).      HYDROcodone-acetaminophen (NORCO) 7.5-325 MG tablet Take 1 tablet by mouth every 6 (six) hours as needed for moderate pain (pain score 4-6).     naratriptan (AMERGE) 2.5 MG tablet Take 1 tablet (2.5 mg total) by mouth as needed for migraine. Take one (1) tablet at onset of headache; if returns or does not resolve, may repeat after 4 hours; do not exceed five (5) mg in 24 hours. 10 tablet 5   niCARdipine (CARDENE) 30 MG capsule Take 30 mg by mouth 3 (three) times daily.     ondansetron (ZOFRAN-ODT) 8 MG disintegrating tablet 8mg  ODT q4 hours prn nausea 10 tablet 0   promethazine (PHENERGAN) 25 MG tablet Take 1 tablet (25 mg total) by mouth every 6 (six) hours as needed for nausea or vomiting. 20 tablet 1   propranolol ER (INDERAL LA) 120 MG 24 hr capsule Take 120 mg by mouth daily.     sertraline (ZOLOFT) 100 MG tablet Take 1 tablet by mouth daily.     tiZANidine (ZANAFLEX) 4 MG tablet Take 1 tablet (4 mg total) by mouth every 6 (six) hours as needed for muscle spasms. (Patient taking differently: Take 4 mg by mouth.) 30 tablet 1   Vitamin D, Ergocalciferol, (DRISDOL) 1.25 MG (50000 UNIT) CAPS capsule Take 50,000 Units by mouth 2 (two) times a week. Thursday and Sunday     zonisamide (ZONEGRAN) 100 MG capsule Take 1 capsule (100 mg total) by mouth at bedtime. 90 capsule 3   spironolactone (ALDACTONE) 25 MG tablet Take 1 tablet (25 mg total) by mouth daily. 30 tablet 0   No facility-administered medications prior to visit.    PAST MEDICAL HISTORY: Past Medical History:  Diagnosis Date   ADHD (attention deficit hyperactivity disorder)    ADHD, adult residual type    Anxiety    Depression    Family history of adverse reaction to anesthesia    sister has PONV   GERD (gastroesophageal reflux disease)    Headache    otc med prn - last one 2 months ago   New onset of congestive heart failure (HCC) 12/31/2022   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   Pseudotumor cerebri     PAST SURGICAL  HISTORY: Past Surgical History:  Procedure Laterality Date   ABDOMINAL HYSTERECTOMY     CESAREAN SECTION     x 2   CYSTOSCOPY N/A 06/19/2015   Procedure: CYSTOSCOPY;  Surgeon: Jaymes Graff, MD;  Location: [facility];  Service: Gynecology;  Laterality: N/A;   GASTRECTOMY     sleeve, [facility]   IR GENERIC HISTORICAL  05/12/2016   IR FLUORO GUIDED NEEDLE PLC ASPIRATION/INJECTION LOC 05/12/2016 Paulina Fusi, MD [facility]   LAPAROSCOPIC VAGINAL HYSTERECTOMY WITH SALPINGECTOMY Bilateral 06/19/2015   Procedure: LAPAROSCOPIC ASSISTED VAGINAL HYSTERECTOMY WITH SALPINGECTOMY;  Surgeon: Jaymes Graff, MD;  Location: [facility];  Service:  Gynecology;  Laterality: Bilateral;   lumbar pucture     x2   SACROILIAC JOINT FUSION Left 10/28/2017   Procedure: SACROILIAC JOINT FUSION;  Surgeon: Venita Lick, MD;  Location: [facility];  Service: Orthopedics;  Laterality: Left;  90 mins   TONSILLECTOMY      FAMILY HISTORY: Family History  Problem Relation Age of Onset   Pneumonia Mother        Sepsis   Heart attack Father     SOCIAL HISTORY: Social History   Socioeconomic History   Marital status: Married    Spouse name: Not on file   Number of children: 2   Years of education: cosmetology   Highest education level: Not on file  Occupational History   Occupation: Homemaker  Tobacco Use   Smoking status: Never   Smokeless tobacco: Never  Substance and Sexual Activity   Alcohol use: Yes    Comment: occasional liquor   Drug use: No   Sexual activity: Yes    Birth control/protection: None    Comment: Husband had vasectomy  Other Topics Concern   Not on file  Social History Narrative   Lives at home with her husband and children.   Right-handed.   3-4 cups caffeine per day.   Social Drivers of Corporate investment banker Strain: Low Risk  (06/05/2022)   Received from [facility], [facility]   Overall Financial Resource Strain (CARDIA)    Difficulty of Paying Living Expenses: Not hard  at all  Food Insecurity: No Food Insecurity (12/31/2022)   Hunger Vital Sign    Worried About Running Out of Food in the Last Year: Never true    Ran Out of Food in the Last Year: Never true  Transportation Needs: No Transportation Needs (12/31/2022)   PRAPARE - Administrator, Civil Service (Medical): No    Lack of Transportation (Non-Medical): No  Physical Activity: Not on file  Stress: Not on file  Social Connections: Unknown (07/01/2021)   Received from [facility], [facility]   Social Network    Social Network: Not on file  Intimate Partner Violence: Not At Risk (12/31/2022)   Humiliation, Afraid, Rape, and Kick questionnaire    Fear of Current or Ex-Partner: No    Emotionally Abused: No    Physically Abused: No    Sexually Abused: No    PHYSICAL EXAM  Vitals:   02/18/23 0747  BP: 128/86  Pulse: 78  Resp: 14  Height: 5\' 4"  (1.626 m)   Body mass index is 46.05 kg/m.  Generalized: Well developed, in no acute distress  Neurological examination  Mentation: Alert oriented to time, place, history taking. Follows all commands speech and language fluent Cranial nerve II-XII: Pupils were equal round reactive to light. Extraocular movements were full, visual field were full on confrontational test. Facial sensation and strength were normal.  Head turning and shoulder shrug  were normal and symmetric. Motor: Normal except decreased strength left upper extremity Sensory: Sensory testing is intact to soft touch on all 4 extremities. No evidence of extinction is noted.  Coordination: Cerebellar testing reveals good finger-nose-finger with the right unable to perform with the left due to injury.  Heel-to-shin is normal bilaterally. Gait and station: Gait is normal.  Reflexes: Deep tendon reflexes are symmetric and normal bilaterally.   DIAGNOSTIC DATA (LABS, IMAGING, TESTING) - I reviewed patient records, labs, notes, testing and imaging myself where  available.  Lab Results  Component Value Date  WBC 9.3 01/01/2023   HGB 12.5 01/01/2023   HCT 36.6 01/01/2023   MCV 95.1 01/01/2023   PLT 324 01/01/2023      Component Value Date/Time   NA 139 01/01/2023 0707   K 3.8 01/01/2023 0707   CL 107 01/01/2023 0707   CO2 21 (L) 01/01/2023 0707   GLUCOSE 90 01/01/2023 0707   BUN 13 01/01/2023 0707   CREATININE 1.00 01/01/2023 0707   CALCIUM 8.9 01/01/2023 0707   PROT 7.3 12/31/2022 0002   ALBUMIN 3.7 12/31/2022 0002   AST 28 12/31/2022 0002   ALT 41 12/31/2022 0002   ALKPHOS 98 12/31/2022 0002   BILITOT 0.6 12/31/2022 0002   GFRNONAA >60 01/01/2023 0707   GFRAA >60 02/18/2018 2006   No results found for: "CHOL", "HDL", "LDLCALC", "LDLDIRECT", "TRIG", "CHOLHDL" Lab Results  Component Value Date   HGBA1C 5.4 12/31/2022   No results found for: "VITAMINB12" Lab Results  Component Value Date   TSH 2.744 12/31/2022    Margie Ege, AGNP-C, DNP 02/18/2023, 8:23 AM [facility] [address], [address] [place], [place] [address] [phone]

## 2023-02-18 ENCOUNTER — Telehealth: Payer: Self-pay

## 2023-02-18 ENCOUNTER — Other Ambulatory Visit: Payer: Self-pay | Admitting: Neurology

## 2023-02-18 ENCOUNTER — Ambulatory Visit: Payer: Commercial Managed Care - HMO | Admitting: Neurology

## 2023-02-18 ENCOUNTER — Encounter: Payer: Self-pay | Admitting: Neurology

## 2023-02-18 ENCOUNTER — Other Ambulatory Visit (HOSPITAL_COMMUNITY): Payer: Self-pay

## 2023-02-18 VITALS — BP 128/86 | HR 78 | Resp 14 | Ht 64.0 in

## 2023-02-18 DIAGNOSIS — G43709 Chronic migraine without aura, not intractable, without status migrainosus: Secondary | ICD-10-CM

## 2023-02-18 DIAGNOSIS — I6783 Posterior reversible encephalopathy syndrome: Secondary | ICD-10-CM

## 2023-02-18 MED ORDER — NURTEC 75 MG PO TBDP: 75.0000 mg | ORAL_TABLET | ORAL | 11 refills | Status: DC | PRN

## 2023-02-18 MED ORDER — NURTEC 75 MG PO TBDP: 1.0000 | ORAL_TABLET | Freq: Every day | ORAL | 0 refills | Status: DC | PRN

## 2023-02-18 NOTE — Progress Notes (Signed)
Chart reviewed, agree above plan ?

## 2023-02-18 NOTE — Addendum Note (Signed)
Addended by: Lenn Cal on: 02/18/2023 08:54 AM   Modules accepted: Orders

## 2023-02-18 NOTE — Patient Instructions (Addendum)
EEG is pending, get MRI brain 03/06/23, monitor migraines, avoid using triptans, will try Nurtec for acute headache, Would recommend avoid driving at this point until EEG/MRI return. Continue monitoring BP very closely.

## 2023-02-18 NOTE — Telephone Encounter (Signed)
Pharmacy Patient Advocate Encounter   Received notification from RX Request Messages that prior authorization for Nurtec 75MG  dispersible tablets is required/requested.   Insurance verification completed.   The patient is insured through [employer] .   Per test claim: PA required; PA submitted to above mentioned insurance via CoverMyMeds Key/confirmation #/EOC [id number] Status is pending

## 2023-02-18 NOTE — Progress Notes (Signed)
Nurtec sample added

## 2023-02-19 ENCOUNTER — Other Ambulatory Visit (HOSPITAL_COMMUNITY): Payer: Self-pay

## 2023-02-19 NOTE — Telephone Encounter (Signed)
Pharmacy Patient Advocate Encounter  Received notification from [employer] that Prior Authorization for Nurtec 75MG  dispersible tablets has been APPROVED from 02/18/2023 to 02/18/2024   PA #/Case ID/Reference #: PA Case ID #: [id number]

## 2023-02-26 ENCOUNTER — Encounter: Payer: Self-pay | Admitting: Neurology

## 2023-03-06 ENCOUNTER — Ambulatory Visit
Admission: RE | Admit: 2023-03-06 | Discharge: 2023-03-06 | Disposition: A | Discharge status: Home / Self Care | Payer: Commercial Managed Care - HMO | Source: Ambulatory Visit | Attending: Neurology | Admitting: Neurology

## 2023-03-06 DIAGNOSIS — I6783 Posterior reversible encephalopathy syndrome: Secondary | ICD-10-CM | POA: Diagnosis not present

## 2023-03-06 MED ORDER — GADOPICLENOL 0.5 MMOL/ML IV SOLN
10.0000 mL | Freq: Once | INTRAVENOUS | Status: AC | PRN
  Administered 2023-03-06: 10 mL via INTRAVENOUS

## 2023-03-09 ENCOUNTER — Telehealth: Payer: Self-pay | Admitting: Neurology

## 2023-03-09 ENCOUNTER — Encounter: Payer: Self-pay | Admitting: Neurology

## 2023-03-09 NOTE — Telephone Encounter (Signed)
 Repeat MRI of the brain was normal. The areas of patchy edematous appearance consistent with PRES have resolved. EEG was normal.   IMPRESSION: Normal MRI scan of the brain with and without contrast.  Upon comparison with previous MRI films from 12/31/2022 the previously described frontal and parieto-occipital cortical T2 hyperintensities have resolved completely.    CONCLUSION: This is a  normal awake EEG.  There is no electrodiagnostic evidence of epileptiform discharge.

## 2023-05-14 ENCOUNTER — Other Ambulatory Visit (HOSPITAL_BASED_OUTPATIENT_CLINIC_OR_DEPARTMENT_OTHER): Payer: Self-pay

## 2023-05-15 ENCOUNTER — Other Ambulatory Visit (HOSPITAL_BASED_OUTPATIENT_CLINIC_OR_DEPARTMENT_OTHER): Payer: Self-pay

## 2023-05-15 MED ORDER — OZEMPIC (0.25 OR 0.5 MG/DOSE) 2 MG/3ML ~~LOC~~ SOPN
0.2500 mg | PEN_INJECTOR | SUBCUTANEOUS | 1 refills | Status: DC
Start: 2023-05-14 — End: 2023-07-07
  Filled 2023-05-15 – 2023-05-26 (×3): qty 3, 28d supply, fill #0

## 2023-05-19 ENCOUNTER — Encounter: Payer: Self-pay | Admitting: Neurology

## 2023-05-19 ENCOUNTER — Ambulatory Visit (INDEPENDENT_AMBULATORY_CARE_PROVIDER_SITE_OTHER): Payer: Commercial Managed Care - HMO | Admitting: Neurology

## 2023-05-19 VITALS — BP 137/81 | HR 77 | Ht 64.0 in | Wt 244.0 lb

## 2023-05-19 DIAGNOSIS — G932 Benign intracranial hypertension: Secondary | ICD-10-CM

## 2023-05-19 DIAGNOSIS — G43709 Chronic migraine without aura, not intractable, without status migrainosus: Secondary | ICD-10-CM

## 2023-05-19 DIAGNOSIS — I6783 Posterior reversible encephalopathy syndrome: Secondary | ICD-10-CM

## 2023-05-19 MED ORDER — PROMETHAZINE HCL 25 MG PO TABS: 25.0000 mg | ORAL_TABLET | Freq: Four times a day (QID) | ORAL | 1 refills | Status: DC | PRN

## 2023-05-19 NOTE — Patient Instructions (Signed)
 Continue current medication.  Use Phenergan cautiously.  Keep a close check on your blood pressure.  Call for worsening headaches.  Follow-up in 6 months.  Thanks

## 2023-05-19 NOTE — Progress Notes (Signed)
 Patient: [name] Date of Birth: 1980-09-12  Reason for Visit: Follow up History from: Patient Primary Neurologist: [place] [place]  ASSESSMENT AND PLAN 43 y.o. year old female   1.  Posterior reversible encephalopathy syndrome 2.  Hypertensive encephalopathy 3.  History of pseudotumor cerebri 4.  Chronic migraine headaches  -Doing well overall.  Migraines under very good control.  About 2 a month. -Continue Zonegran 100 mg at bedtime for migraine preventative -Continue Nurtec 75 mg tablet as needed for acute headache, avoid triptan due to uncontrolled hypertension.  I refilled her Phenergan to take as needed.  Mention in ER note mild QT prolongation, we discussed antiemetics can have this side effect.  Use with caution. -MRI of the brain with and without contrast in January 2025 was normal, previous reported patchy edematous areas consistent with PRES had resolved -EEG was normal -Recommend follow-up with ophthalmology with history of IH -Close follow-up with primary care for strict management of blood pressure -Follow-up in 6 months or sooner if needed  IMPRESSION: Normal MRI scan of the brain with and without contrast.  Upon comparison with previous MRI films from 12/31/2022 the previously described frontal and parieto-occipital cortical T2 hyperintensities have resolved completely.    CONCLUSION: This is a  normal awake EEG.  There is no electrodiagnostic evidence of epileptiform discharge.  HISTORY  [name] is a 43 years old right-handed female, accompanied by her husband [name], seen in refer by her primary care physician nurse practitioner [name] for evaluation of pseudotumor cerebri, initial evaluation was on May 06 2016.   I saw her in 2011, She had a history of hypertension during pregnancy, postpartum depression, frequent headaches postpartum day, there was mild evidence of papillary edema on examination, she was referred for CT head without contrast  that was normal in December 2011, lumbar puncture on February 06 2010, patient reported elevated open pressure 44 cm water, she later developed difficulty urination, low back pain, had MRI of lumbar on February 08 2010, that was normal.   I reviewed and summarized the referring note, reported a history of ADHD since age 28, only tolerate Adderall extended release, anxiety, currently taking Wellbutrin 300 mg daily, recently started on BuSpar 15 mg twice a day, Klonopin 1 mg twice a day as needed, also taking instant release Adderall 5 mg one tablet in the afternoon   She  lost insurance has lost follow-up since 2011, today she came in complains of gradual worsening headaches since 2016, now she has daily left occipital headache, spreading forward to become left retro-orbital area severe pounding headache with associated light noise sensitivity, nauseous, she went to emergency room about 6 times over the past 12 months for headache treatment, she also had steady weight gain, complains of transient difficulty focusing with sudden body positional change. She denies tinnitus, no hearing loss.  UPDATE Jul 07 2016:YY She had a fluoroscopy guided lumbar puncture on May 07 2016, open pressure was 23 cm water, closing pressure was 12 cm water.   Post LP she developed severe positional related headache, require hospital admission, was treated with Morphine as needed, also received scheduled Percocet, Fioricet. She eventually had a blood patch on March twelfth 2018.   She could not tolerate Topamax, she complains of bilateral feet neuropathic pain, numbness tingling, also complains of alternating of the taste especially with soda, and her mouth feel raw, she could not tolerate it, she was put on Zonegran 100 mg twice a day since and of  March 2018, she could not tolerate it better, she noticed improvement of her headaches.   Over the past 2 months, on average he has one or two headache each week, left-sided lateralized  severe pounding headache with associated light noise sensitivity, nauseous, she is now taking Maxalt 10 mg, sometimes Fioricet as needed, this will usually take care of the headache within one hour.   she is planning on to have bariatric surgery soon. have bariatric evaluation Jul 28 2016.   Update November 18, 2022 SS:  Patient was in the ER 11/08/22 for migraine headache, given migraine cocktail with good improvement.      Remains on Zonegran 100 mg at bedtime, Amerge provided brief relief, would return. Having 3-4 migraines a month, can last 1-2 days. Summer is worse. I sent Nurtec, she didn't pick it up yet. Has not seen her eye doctor. Weight is up 13 lbs, not a candidate for injectable medications, A1C is perfect. Migraine aura of yawning, usually right sided occipital, spreads forward, behind right eye, has nausea, sensitive to sound. Really can't distinguish IIH vs migraine headaches, IIH was so long ago.   Update February 18, 2023 SS: On 12/30/2022 went to the ER reporting blurry vision and confusion, migraine earlier that day with typical nausea and blurry vision. Took Maxalt.  Later that evening she became confused worsening vision changes, passed out.  EMS reported blood pressure over 240 systolic.  170s in the ER.  CT angio of head did not show any emergent large vessel occlusion or high-grade stenosis.  MRI of the brain showed patchy edematous appearance along the superior and posterior aspect of the cerebral hemispheres consistent with PRES  Labs showed WBC count 15.8, hemoglobin 15.2, glucose 146,  troponin 65> 67, BNP over 1100  Patient was given IV Toradol, IV Benadryl, oral clonidine, IV labetalol and IV Lasix.  On 12/23/2022 she had a fall with left anterior shoulder dislocation.  With the above, now suspicious for unwitnessed seizure. She had fallen asleep in the chair watching TV.   Scheduled for repeat MRI of the brain 03/06/2023.  Repeat EEG has been ordered.  Working  with PCP for BP regulation, taking Cardene, clonidine, propranolol, spironolactone. Clonidine and propranolol have been chronic for anxiety. BP good today 128/86.   Migraines have been really well. Never started [employer], insurance wouldn't cover that or Nurtec. Has only been taking Zonegran.   Confusion has resolved. Feels doing better. PCP has tried to get several weight loss drugs covered without successful. Doing PT for the left shoulder, unclear if surgery is needed.   Update 05/19/2023 SS: MRI of the brain with and without contrast January 2025 was normal, the areas of patchy edematous appearance consistent with PRES have resolved.  EEG was normal. Still doing PT for left arm injury, waiting for an MRI, still not better. Headaches are doing well, 2 migraines last month, took Nurtec with excellent benefit! Saw PCP last week. BP was low last week, had to go get fluids. Considering trying to get weight loss drug.   REVIEW OF SYSTEMS: Out of a complete 14 system review of symptoms, the patient complains only of the following symptoms, and all other reviewed systems are negative.  See HPI  ALLERGIES: Allergies  Allergen Reactions   Aspirin Other (See Comments)    Stomach pain Severe abdominal pain   Duloxetine Hcl Other (See Comments)    Blisters, "shut kidneys down"   Lorazepam Other (See Comments)    "Can't remember anything  while on it" aggressive    Naproxen Nausea And Vomiting and Other (See Comments)    cramping   Penicillins Hives    Has patient had a PCN reaction causing immediate rash, facial/tongue/throat swelling, SOB or lightheadedness with hypotension: No Has patient had a PCN reaction causing severe rash involving mucus membranes or skin necrosis: No Has patient had a PCN reaction that required hospitalization No Has patient had a PCN reaction occurring within the last 10 years: No If all of the above answers are "NO", then may proceed with Cephalosporin use.   Cephalexin      Unknown reaction   Buspar [Buspirone] Other (See Comments)    "felt weird, heart raced, cloudy"   Macrobid [Nitrofurantoin Monohyd Macro] Nausea Only and Other (See Comments)    Stomach pain   Sudafed [Pseudoephedrine Hcl] Hives   Topiramate Other (See Comments)    Numbness/tingling in mouth/feet    HOME MEDICATIONS: Outpatient Medications Prior to Visit  Medication Sig Dispense Refill   ALPRAZolam (XANAX XR) 0.5 MG 24 hr tablet Take 1 tablet by mouth daily.     cloNIDine (CATAPRES) 0.1 MG tablet Take 1 tablet (0.1 mg total) by mouth 3 (three) times daily. (Patient taking differently: Take 0.1-0.3 mg by mouth 3 (three) times daily.) 90 tablet 0   diphenhydrAMINE (BENADRYL) 25 MG tablet Take 25-50 mg by mouth daily as needed for allergies.     hydrochlorothiazide (MICROZIDE) 12.5 MG capsule Take 12.5 mg by mouth daily as needed (for edema).     HYDROcodone-acetaminophen (NORCO) 7.5-325 MG tablet Take 1 tablet by mouth every 6 (six) hours as needed for moderate pain (pain score 4-6).     niCARdipine (CARDENE) 30 MG capsule Take 30 mg by mouth 3 (three) times daily.     NURTEC 75 MG TBDP TAKE 1 TABLET (75 MG TOTAL) BY MOUTH AS NEEDED. 8 tablet 11   ondansetron (ZOFRAN-ODT) 8 MG disintegrating tablet 8mg  ODT q4 hours prn nausea 10 tablet 0   propranolol ER (INDERAL LA) 120 MG 24 hr capsule Take 120 mg by mouth daily.     Rimegepant Sulfate (NURTEC) 75 MG TBDP Take 1 tablet (75 mg total) by mouth daily as needed (1 tablet at onset of migraine, no more that 1 tablet in 24 hours). 2 tablet 0   sertraline (ZOLOFT) 100 MG tablet Take 1 tablet by mouth daily.     tiZANidine (ZANAFLEX) 4 MG tablet Take 1 tablet (4 mg total) by mouth every 6 (six) hours as needed for muscle spasms. (Patient taking differently: Take 4 mg by mouth.) 30 tablet 1   Vitamin D, Ergocalciferol, (DRISDOL) 1.25 MG (50000 UNIT) CAPS capsule Take 50,000 Units by mouth 2 (two) times a week. Thursday and Sunday     zonisamide  (ZONEGRAN) 100 MG capsule Take 1 capsule (100 mg total) by mouth at bedtime. 90 capsule 3   naratriptan (AMERGE) 2.5 MG tablet Take 1 tablet (2.5 mg total) by mouth as needed for migraine. Take one (1) tablet at onset of headache; if returns or does not resolve, may repeat after 4 hours; do not exceed five (5) mg in 24 hours. 10 tablet 5   promethazine (PHENERGAN) 25 MG tablet Take 1 tablet (25 mg total) by mouth every 6 (six) hours as needed for nausea or vomiting. 20 tablet 1   Semaglutide,0.25 or 0.5MG /DOS, (OZEMPIC, 0.25 OR 0.5 MG/DOSE,) 2 MG/3ML SOPN Inject 0.25 mg into the skin once a week. (Patient not taking: Reported on 05/19/2023)  3 mL 1   spironolactone (ALDACTONE) 25 MG tablet Take 1 tablet (25 mg total) by mouth daily. 30 tablet 0   No facility-administered medications prior to visit.    PAST MEDICAL HISTORY: Past Medical History:  Diagnosis Date   ADHD (attention deficit hyperactivity disorder)    ADHD, adult residual type    Anxiety    Depression    Family history of adverse reaction to anesthesia    sister has PONV   GERD (gastroesophageal reflux disease)    Headache    otc med prn - last one 2 months ago   New onset of congestive heart failure (HCC) 12/31/2022   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   Pseudotumor cerebri     PAST SURGICAL HISTORY: Past Surgical History:  Procedure Laterality Date   ABDOMINAL HYSTERECTOMY     CESAREAN SECTION     x 2   CYSTOSCOPY N/A 06/19/2015   Procedure: CYSTOSCOPY;  Surgeon: Jaymes Graff, MD;  Location: [facility];  Service: Gynecology;  Laterality: N/A;   GASTRECTOMY     sleeve, [facility]   IR GENERIC HISTORICAL  05/12/2016   IR FLUORO GUIDED NEEDLE PLC ASPIRATION/INJECTION LOC 05/12/2016 Paulina Fusi, MD [facility]   LAPAROSCOPIC VAGINAL HYSTERECTOMY WITH SALPINGECTOMY Bilateral 06/19/2015   Procedure: LAPAROSCOPIC ASSISTED VAGINAL HYSTERECTOMY WITH SALPINGECTOMY;  Surgeon: Jaymes Graff, MD;  Location: [facility];  Service:  Gynecology;  Laterality: Bilateral;   lumbar pucture     x2   SACROILIAC JOINT FUSION Left 10/28/2017   Procedure: SACROILIAC JOINT FUSION;  Surgeon: Venita Lick, MD;  Location: [facility] OR;  Service: Orthopedics;  Laterality: Left;  90 mins   TONSILLECTOMY      FAMILY HISTORY: Family History  Problem Relation Age of Onset   Pneumonia Mother        Sepsis   Heart attack Father     SOCIAL HISTORY: Social History   Socioeconomic History   Marital status: Married    Spouse name: Not on file   Number of children: 2   Years of education: cosmetology   Highest education level: Not on file  Occupational History   Occupation: Homemaker  Tobacco Use   Smoking status: Never   Smokeless tobacco: Never  Substance and Sexual Activity   Alcohol use: Yes    Comment: occasional liquor   Drug use: No   Sexual activity: Yes    Birth control/protection: None    Comment: Husband had vasectomy  Other Topics Concern   Not on file  Social History Narrative   Lives at home with her husband and children.   Right-handed.   3-4 cups caffeine per day.   Social Drivers of Corporate investment banker Strain: Low Risk  (06/05/2022)   [facility]   Overall Financial Resource Strain (CARDIA)    Difficulty of Paying Living Expenses: Not hard at all  Food Insecurity: No Food Insecurity (12/31/2022)   Hunger Vital Sign    Worried About Running Out of Food in the Last Year: Never true    Ran Out of Food in the Last Year: Never true  Transportation Needs: No Transportation Needs (12/31/2022)   PRAPARE - Administrator, Civil Service (Medical): No    Lack of Transportation (Non-Medical): No  Physical Activity: Not on file  Stress: Not on file  Social Connections: Unknown (07/01/2021)   Received from [facility]   Social Network    Social Network: Not on  file  Intimate Partner Violence: Not At Risk (12/31/2022)   Humiliation, Afraid, Rape,  and Kick questionnaire    Fear of Current or Ex-Partner: No    Emotionally Abused: No    Physically Abused: No    Sexually Abused: No    PHYSICAL EXAM  Vitals:   05/19/23 1058  BP: 137/81  Pulse: 77  Weight: 244 lb (110.7 kg)  Height: 5\' 4"  (1.626 m)    Body mass index is 41.88 kg/m.  Generalized: Well developed, in no acute distress  Neurological examination  Mentation: Alert oriented to time, place, history taking. Follows all commands speech and language fluent Cranial nerve II-XII: Pupils were equal round reactive to light. Extraocular movements were full, visual field were full on confrontational test. Facial sensation and strength were normal.  Head turning and shoulder shrug  were normal and symmetric. Motor: Normal except decreased strength left upper extremity Sensory: Sensory testing is intact to soft touch on all 4 extremities. No evidence of extinction is noted.  Coordination: Cerebellar testing reveals good finger-nose-finger with the right unable to perform with the left due to injury.  Heel-to-shin is normal bilaterally. Gait and station: Gait is normal.  Reflexes: Deep tendon reflexes are symmetric and normal bilaterally.   DIAGNOSTIC DATA (LABS, IMAGING, TESTING) - I reviewed patient records, labs, notes, testing and imaging myself where available.  Lab Results  Component Value Date   WBC 9.3 01/01/2023   HGB 12.5 01/01/2023   HCT 36.6 01/01/2023   MCV 95.1 01/01/2023   PLT 324 01/01/2023      Component Value Date/Time   NA 139 01/01/2023 0707   K 3.8 01/01/2023 0707   CL 107 01/01/2023 0707   CO2 21 (L) 01/01/2023 0707   GLUCOSE 90 01/01/2023 0707   BUN 13 01/01/2023 0707   CREATININE 1.00 01/01/2023 0707   CALCIUM 8.9 01/01/2023 0707   PROT 7.3 12/31/2022 0002   ALBUMIN 3.7 12/31/2022 0002   AST 28 12/31/2022 0002   ALT 41 12/31/2022 0002   ALKPHOS 98 12/31/2022 0002   BILITOT 0.6 12/31/2022 0002   GFRNONAA >60 01/01/2023 0707   GFRAA >60  02/18/2018 2006   No results found for: "CHOL", "HDL", "LDLCALC", "LDLDIRECT", "TRIG", "CHOLHDL" Lab Results  Component Value Date   HGBA1C 5.4 12/31/2022   No results found for: "VITAMINB12" Lab Results  Component Value Date   TSH 2.744 12/31/2022    Margie Ege, AGNP-C, DNP 05/19/2023, 11:51 AM [facility] [address], [address] [place], [place] [address] [phone]

## 2023-05-26 ENCOUNTER — Other Ambulatory Visit (HOSPITAL_BASED_OUTPATIENT_CLINIC_OR_DEPARTMENT_OTHER): Payer: Self-pay

## 2023-05-26 MED ORDER — SEMAGLUTIDE-WEIGHT MANAGEMENT 0.25 MG/0.5ML ~~LOC~~ SOAJ
0.2500 mg | SUBCUTANEOUS | 1 refills | Status: DC
Start: 2023-05-26 — End: 2023-07-07
  Filled 2023-05-26: qty 2, 28d supply, fill #0

## 2023-06-05 ENCOUNTER — Other Ambulatory Visit (HOSPITAL_BASED_OUTPATIENT_CLINIC_OR_DEPARTMENT_OTHER): Payer: Self-pay

## 2023-06-06 NOTE — Progress Notes (Signed)
 Chart reviewed, agree above plan ?

## 2023-06-25 ENCOUNTER — Telehealth: Payer: Self-pay | Admitting: Anesthesiology

## 2023-06-25 NOTE — Telephone Encounter (Signed)
 Surgical clearance form was faxed to [facility] and confirmation was received.

## 2023-06-25 NOTE — Telephone Encounter (Signed)
 [name]

## 2023-07-13 NOTE — Progress Notes (Signed)
 Patient interviewed over the phone. Coming in for labs 07/15/23 at 1300.  COVID Vaccine Completed: yes  Date of COVID positive in last 90 days:  PCP - Oleta Bernhardt, PA Cardiologist - n/a  Medical clearance in media tab dated 06/26/23  Chest x-ray - 12/31/22 Epic EKG - 05/06/23 [facility] Stress Test - n/a ECHO - 12/31/22 Epic Cardiac Cath - n/a Pacemaker/ICD device last checked: n/a Spinal Cord Stimulator: n/a  Bowel Prep - no  Sleep Study - n/a CPAP -   Fasting Blood Sugar - n/a Checks Blood Sugar _____ times a day  Last dose of GLP1 agonist-  N/A GLP1 instructions:  Hold 7 days before surgery    Last dose of SGLT-2 inhibitors-  N/A SGLT-2 instructions:  Hold 3 days before surgery    Blood Thinner Instructions:  Last dose: n/a  Time: Aspirin Instructions: Last Dose:  Activity level: Can go up a flight of stairs and perform activities of daily living without stopping and without symptoms of chest pain or shortness of breath.   Anesthesia review: CHF, hypotension, PRES, dyspnea, anemia  Patient denies shortness of breath, fever, cough and chest pain at PAT appointment  Patient verbalized understanding of instructions that were given to them at the PAT appointment. Patient was also instructed that they will need to review over the PAT instructions again at home before surgery.

## 2023-07-13 NOTE — Patient Instructions (Signed)
 SURGICAL WAITING ROOM VISITATION  Patients having surgery or a procedure may have no more than 2 support people in the waiting area - these visitors may rotate.    Children under the age of 65 must have an adult with them who is not the patient.  Due to an increase in RSV and influenza rates and associated hospitalizations, children ages 31 and under may not visit patients in [facility] hospitals.  Visitors with respiratory illnesses are discouraged from visiting and should remain at home.  If the patient needs to stay at the hospital during part of their recovery, the visitor guidelines for inpatient rooms apply. Pre-op nurse will coordinate an appropriate time for 1 support person to accompany patient in pre-op.  This support person may not rotate.    Please refer to the [facility] website for the visitor guidelines for Inpatients (after your surgery is over and you are in a regular room).    Your procedure is scheduled on: 07/24/23   Report to [facility] Main Entrance    Report to admitting at 7:45 AM   Call this number if you have problems the morning of surgery [phone]   Do not eat food :After Midnight.   After Midnight you may have the following liquids until 7:00 AM DAY OF SURGERY  Water  Non-Citrus Juices (without pulp, NO RED-Apple, White grape, White cranberry) Black Coffee (NO MILK/CREAM OR CREAMERS, sugar ok)  Clear Tea (NO MILK/CREAM OR CREAMERS, sugar ok) regular and decaf                             Plain Jell-O (NO RED)                                           Fruit ices (not with fruit pulp, NO RED)                                     Popsicles (NO RED)                                                               Sports drinks like Gatorade (NO RED)                 The day of surgery:  Drink ONE (1) Pre-Surgery Clear Ensure at 7:00 AM the morning of surgery. Drink in one sitting. Do not sip.  This drink was given to you during your hospital   pre-op appointment visit. Nothing else to drink after completing the  Pre-Surgery Clear Ensure.          If you have questions, please contact your surgeon's office.   FOLLOW BOWEL PREP AND ANY ADDITIONAL PRE OP INSTRUCTIONS YOU RECEIVED FROM YOUR SURGEON'S OFFICE!!!     Oral Hygiene is also important to reduce your risk of infection.                                    Remember -  BRUSH YOUR TEETH THE MORNING OF SURGERY WITH YOUR REGULAR TOOTHPASTE  DENTURES WILL BE REMOVED PRIOR TO SURGERY PLEASE DO NOT APPLY "Poly grip" OR ADHESIVES!!!   Stop all vitamins and herbal supplements 7 days before surgery.   Take these medicines the morning of surgery with A SIP OF WATER : Xanax , Clonidine , Pepcid, Norco, Zofran , Pantoprazole , Nicardipine, Propranolol                               You may not have any metal on your body including hair pins, jewelry, and body piercing             Do not wear make-up, lotions, powders, perfumes, or deodorant  Do not wear nail polish including gel and S&S, artificial/acrylic nails, or any other type of covering on natural nails including finger and toenails. If you have artificial nails, gel coating, etc. that needs to be removed by a nail salon please have this removed prior to surgery or surgery may need to be canceled/ delayed if the surgeon/ anesthesia feels like they are unable to be safely monitored.   Do not shave  48 hours prior to surgery.    Do not bring valuables to the hospital. [facility] IS NOT             RESPONSIBLE   FOR VALUABLES.   Contacts, glasses, dentures or bridgework may not be worn into surgery.  DO NOT BRING YOUR HOME MEDICATIONS TO THE HOSPITAL. PHARMACY WILL DISPENSE MEDICATIONS LISTED ON YOUR MEDICATION LIST TO YOU DURING YOUR ADMISSION IN THE HOSPITAL!    Patients discharged on the day of surgery will not be allowed to drive home.  Someone NEEDS to stay with you for the first 24 hours after anesthesia.              Please  read over the following fact sheets you were given: IF YOU HAVE QUESTIONS ABOUT YOUR PRE-OP INSTRUCTIONS PLEASE CALL [name]   If you received a COVID test during your pre-op visit  it is requested that you wear a mask when out in public, stay away from anyone that may not be feeling well and notify your surgeon if you develop symptoms. If you test positive for Covid or have been in contact with anyone that has tested positive in the last 10 days please notify you surgeon.    [facility] - Preparing for Surgery Before surgery, you can play an important role.  Because skin is not sterile, your skin needs to be as free of germs as possible.  You can reduce the number of germs on your skin by washing with CHG (chlorahexidine gluconate) soap before surgery.  CHG is an antiseptic cleaner which kills germs and bonds with the skin to continue killing germs even after washing. Please DO NOT use if you have an allergy to CHG or antibacterial soaps.  If your skin becomes reddened/irritated stop using the CHG and inform your nurse when you arrive at Short Stay. Do not shave (including legs and underarms) for at least 48 hours prior to the first CHG shower.  You may shave your face/neck.  Please follow these instructions carefully:  1.  Shower with CHG Soap the night before surgery and the  morning of surgery.  2.  If you choose to wash your hair, wash your hair first as usual with your normal  shampoo.  3.  After you shampoo, rinse your  hair and body thoroughly to remove the shampoo.                             4.  Use CHG as you would any other liquid soap.  You can apply chg directly to the skin and wash.  Gently with a scrungie or clean washcloth.  5.  Apply the CHG Soap to your body ONLY FROM THE NECK DOWN.   Do   not use on face/ open                           Wound or open sores. Avoid contact with eyes, ears mouth and   genitals (private parts).                       Wash face,  Genitals  (private parts) with your normal soap.             6.  Wash thoroughly, paying special attention to the area where your    surgery  will be performed.  7.  Thoroughly rinse your body with warm water  from the neck down.  8.  DO NOT shower/wash with your normal soap after using and rinsing off the CHG Soap.                9.  Pat yourself dry with a clean towel.            10.  Wear clean pajamas.            11.  Place clean sheets on your bed the night of your first shower and do not  sleep with pets. Day of Surgery : Do not apply any lotions/deodorants the morning of surgery.  Please wear clean clothes to the hospital/surgery center.  FAILURE TO FOLLOW THESE INSTRUCTIONS MAY RESULT IN THE CANCELLATION OF YOUR SURGERY  PATIENT SIGNATURE_________________________________  NURSE SIGNATURE__________________________________  ________________________________________________________________________  [name]  An incentive spirometer is a tool that can help keep your lungs clear and active. This tool measures how well you are filling your lungs with each breath. Taking long deep breaths may help reverse or decrease the chance of developing breathing (pulmonary) problems (especially infection) following: A long period of time when you are unable to move or be active. BEFORE THE PROCEDURE  If the spirometer includes an indicator to show your best effort, your nurse or respiratory therapist will set it to a desired goal. If possible, sit up straight or lean slightly forward. Try not to slouch. Hold the incentive spirometer in an upright position. INSTRUCTIONS FOR USE  Sit on the edge of your bed if possible, or sit up as far as you can in bed or on a chair. Hold the incentive spirometer in an upright position. Breathe out normally. Place the mouthpiece in your mouth and seal your lips tightly around it. Breathe in slowly and as deeply as possible, raising the piston or the ball toward  the top of the column. Hold your breath for 3-5 seconds or for as long as possible. Allow the piston or ball to fall to the bottom of the column. Remove the mouthpiece from your mouth and breathe out normally. Rest for a few seconds and repeat Steps 1 through 7 at least 10 times every 1-2 hours when you are awake. Take your time and take a few normal breaths between deep breaths.  The spirometer may include an indicator to show your best effort. Use the indicator as a goal to work toward during each repetition. After each set of 10 deep breaths, practice coughing to be sure your lungs are clear. If you have an incision (the cut made at the time of surgery), support your incision when coughing by placing a pillow or rolled up towels firmly against it. Once you are able to get out of bed, walk around indoors and cough well. You may stop using the incentive spirometer when instructed by your caregiver.  RISKS AND COMPLICATIONS Take your time so you do not get dizzy or light-headed. If you are in pain, you may need to take or ask for pain medication before doing incentive spirometry. It is harder to take a deep breath if you are having pain. AFTER USE Rest and breathe slowly and easily. It can be helpful to keep track of a log of your progress. Your caregiver can provide you with a simple table to help with this. If you are using the spirometer at home, follow these instructions: SEEK MEDICAL CARE IF:  You are having difficultly using the spirometer. You have trouble using the spirometer as often as instructed. Your pain medication is not giving enough relief while using the spirometer. You develop fever of 100.5 F (38.1 C) or higher. SEEK IMMEDIATE MEDICAL CARE IF:  You cough up bloody sputum that had not been present before. You develop fever of 102 F (38.9 C) or greater. You develop worsening pain at or near the incision site. MAKE SURE YOU:  Understand these instructions. Will watch your  condition. Will get help right away if you are not doing well or get worse. Document Released: 06/30/2006 Document Revised: 05/12/2011 Document Reviewed: 08/31/2006 [facility] Patient Information 2014 [place], [place].   ________________________________________________________________________

## 2023-07-14 ENCOUNTER — Other Ambulatory Visit: Payer: Self-pay

## 2023-07-14 ENCOUNTER — Encounter (HOSPITAL_COMMUNITY): Payer: Self-pay

## 2023-07-14 ENCOUNTER — Encounter (HOSPITAL_COMMUNITY)
Admission: RE | Admit: 2023-07-14 | Discharge: 2023-07-14 | Disposition: A | Discharge status: Home / Self Care | Source: Ambulatory Visit | Attending: Orthopedic Surgery | Admitting: Orthopedic Surgery

## 2023-07-14 DIAGNOSIS — Z9884 Bariatric surgery status: Secondary | ICD-10-CM | POA: Diagnosis not present

## 2023-07-14 DIAGNOSIS — Z6841 Body Mass Index (BMI) 40.0 and over, adult: Secondary | ICD-10-CM | POA: Insufficient documentation

## 2023-07-14 DIAGNOSIS — G932 Benign intracranial hypertension: Secondary | ICD-10-CM | POA: Insufficient documentation

## 2023-07-14 DIAGNOSIS — F419 Anxiety disorder, unspecified: Secondary | ICD-10-CM | POA: Insufficient documentation

## 2023-07-14 DIAGNOSIS — M75102 Unspecified rotator cuff tear or rupture of left shoulder, not specified as traumatic: Secondary | ICD-10-CM | POA: Insufficient documentation

## 2023-07-14 DIAGNOSIS — Z01812 Encounter for preprocedural laboratory examination: Secondary | ICD-10-CM | POA: Insufficient documentation

## 2023-07-14 DIAGNOSIS — F32A Depression, unspecified: Secondary | ICD-10-CM | POA: Insufficient documentation

## 2023-07-14 DIAGNOSIS — E669 Obesity, unspecified: Secondary | ICD-10-CM | POA: Diagnosis not present

## 2023-07-14 DIAGNOSIS — G43909 Migraine, unspecified, not intractable, without status migrainosus: Secondary | ICD-10-CM | POA: Insufficient documentation

## 2023-07-14 DIAGNOSIS — I6783 Posterior reversible encephalopathy syndrome: Secondary | ICD-10-CM | POA: Insufficient documentation

## 2023-07-14 DIAGNOSIS — I110 Hypertensive heart disease with heart failure: Secondary | ICD-10-CM | POA: Diagnosis not present

## 2023-07-14 DIAGNOSIS — I5032 Chronic diastolic (congestive) heart failure: Secondary | ICD-10-CM | POA: Diagnosis not present

## 2023-07-14 HISTORY — DX: Anemia, unspecified: D64.9

## 2023-07-14 HISTORY — DX: Posterior reversible encephalopathy syndrome: I67.83

## 2023-07-15 ENCOUNTER — Encounter (HOSPITAL_COMMUNITY)
Admission: RE | Admit: 2023-07-15 | Discharge: 2023-07-15 | Disposition: A | Discharge status: Home / Self Care | Source: Ambulatory Visit | Attending: Orthopedic Surgery | Admitting: Orthopedic Surgery

## 2023-07-15 DIAGNOSIS — I6783 Posterior reversible encephalopathy syndrome: Secondary | ICD-10-CM | POA: Diagnosis not present

## 2023-07-15 DIAGNOSIS — Z01812 Encounter for preprocedural laboratory examination: Secondary | ICD-10-CM | POA: Insufficient documentation

## 2023-07-15 LAB — BASIC METABOLIC PANEL WITH GFR
Anion gap: 10 (ref 5–15)
BUN: 19 mg/dL (ref 6–20)
CO2: 22 mmol/L (ref 22–32)
Calcium: 9.6 mg/dL (ref 8.9–10.3)
Chloride: 102 mmol/L (ref 98–111)
Creatinine, Ser: 1.05 mg/dL — ABNORMAL HIGH (ref 0.44–1.00)
GFR, Estimated: >60 mL/min (ref >60)
Glucose, Bld: 121 mg/dL — ABNORMAL HIGH (ref 70–99)
Potassium: 4.2 mmol/L (ref 3.5–5.1)
Sodium: 134 mmol/L — ABNORMAL LOW (ref 135–145)

## 2023-07-15 LAB — CBC
HCT: 39 % (ref 36.0–46.0)
Hemoglobin: 12.8 g/dL (ref 12.0–15.0)
MCH: 33.4 pg (ref 26.0–34.0)
MCHC: 32.8 g/dL (ref 30.0–36.0)
MCV: 101.8 fL — ABNORMAL HIGH (ref 80.0–100.0)
Platelets: 500 10*3/uL — ABNORMAL HIGH (ref 150–400)
RBC: 3.83 MIL/uL — ABNORMAL LOW (ref 3.87–5.11)
RDW: 12.4 % (ref 11.5–15.5)
WBC: 10.5 10*3/uL (ref 4.0–10.5)
nRBC: 0 % (ref 0.0–0.2)

## 2023-07-16 ENCOUNTER — Encounter (HOSPITAL_COMMUNITY): Payer: Self-pay

## 2023-07-16 NOTE — Anesthesia Preprocedure Evaluation (Addendum)
 Anesthesia Evaluation  Patient identified by MRN, date of birth, ID band Patient awake    Reviewed: Allergy & Precautions, H&P , NPO status , Patient's Chart, lab work & pertinent test results  History of Anesthesia Complications (+) POST - OP SPINAL HEADACHE and history of anesthetic complications  Airway Mallampati: II  TM Distance: >3 FB Neck ROM: Full    Dental  (+) Upper Dentures, Lower Dentures   Pulmonary neg pulmonary ROS, neg shortness of breath   Pulmonary exam normal breath sounds clear to auscultation       Cardiovascular +CHF  Normal cardiovascular exam Rhythm:Regular Rate:Normal  IMPRESSIONS     1. Left ventricular ejection fraction, by estimation, is 60 to 65%. The  left ventricle has normal function. The left ventricle has no regional  wall motion abnormalities. There is mild left ventricular hypertrophy.  Left ventricular diastolic parameters  are consistent with Grade I diastolic dysfunction (impaired relaxation).   2. Right ventricular systolic function is normal. The right ventricular  size is normal. Tricuspid regurgitation signal is inadequate for assessing  PA pressure.   3. The mitral valve is normal in structure. No evidence of mitral valve  regurgitation.   4. The aortic valve was not well visualized. Aortic valve regurgitation  is not visualized.     Neuro/Psych  Headaches PSYCHIATRIC DISORDERS Anxiety Depression    Pseudotumor cerebri     GI/Hepatic Neg liver ROS,GERD  ,,  Endo/Other  negative endocrine ROS    Renal/GU negative Renal ROS  negative genitourinary   Musculoskeletal negative musculoskeletal ROS (+)    Abdominal  (+) + obese  Peds negative pediatric ROS (+)  Hematology  (+) Blood dyscrasia, anemia   Anesthesia Other Findings   Reproductive/Obstetrics negative OB ROS                             Anesthesia Physical Anesthesia Plan  ASA:  3  Anesthesia Plan: General and Regional   Post-op Pain Management: Regional block*   Induction: Intravenous  PONV Risk Score and Plan: 3 and Ondansetron , Dexamethasone , Midazolam  and Treatment may vary due to age or medical condition  Airway Management Planned: Oral ETT  Additional Equipment: None  Intra-op Plan:   Post-operative Plan: Extubation in OR  Informed Consent: I have reviewed the patients History and Physical, chart, labs and discussed the procedure including the risks, benefits and alternatives for the proposed anesthesia with the patient or authorized representative who has indicated his/her understanding and acceptance.     Dental advisory given  Plan Discussed with: CRNA  Anesthesia Plan Comments: (See PAT note from 5/13  )        Anesthesia Quick Evaluation

## 2023-07-16 NOTE — Progress Notes (Signed)
 Case: [id number] Date/Time: 07/24/23 0945   Procedures:      ARTHROSCOPY, SHOULDER, WITH ROTATOR CUFF REPAIR (Left)     TENODESIS, BICEPS (Left)   Anesthesia type: Choice   Pre-op diagnosis: Left shoulder greater tuberosity nonunion, rotator cuff tear   Location: [facility] ROOM 07 / [facility]   Surgeons: Janeth Medicus, MD       DISCUSSION: [name] is a 43 yo female who presents to PAT prior to surgery above. PMH of HTN, HFpEF, PRES, migraines, pseudotumor cerebri, s/p gastric sleeve, anxiety, depression, ADHD, obesity (BMI 41).  Pt follows with Neurology for hx of PRES, migraines. Last seen on 05/19/23. Symptoms controlled. Neurology clearance signed with instructions "closely monitor BP, had hospital admission Oct 2024 for PRES". MRI in Jan 2025 showed PRES was resolved.    VS: LMP 06/12/2015 Comment: negative pregnancy test result today.  PROVIDERS: [place], PA-C   LABS: Labs reviewed: Acceptable for surgery. (all labs ordered are listed, but only abnormal results are displayed)  Labs Reviewed - No data to display   IMAGES:  MRI Brain 03/06/23:   IMPRESSION: Normal MRI scan of the brain with and without contrast.  Upon comparison with previous MRI films from 12/31/2022 the previously described frontal and parieto-occipital cortical T2 hyperintensities have resolved completely.  EKG:   CV:  Echo 01/01/23:  IMPRESSIONS    1. Left ventricular ejection fraction, by estimation, is 60 to 65%. The left ventricle has normal function. The left ventricle has no regional wall motion abnormalities. There is mild left ventricular hypertrophy. Left ventricular diastolic parameters are consistent with Grade I diastolic dysfunction (impaired relaxation).  2. Right ventricular systolic function is normal. The right ventricular size is normal. Tricuspid regurgitation signal is inadequate for assessing PA pressure.  3. The mitral valve is normal in structure. No  evidence of mitral valve regurgitation.  4. The aortic valve was not well visualized. Aortic valve regurgitation is not visualized. Past Medical History:  Diagnosis Date   ADHD (attention deficit hyperactivity disorder)    ADHD, adult residual type    Anemia    Anxiety    Depression    Family history of adverse reaction to anesthesia    sister has PONV   GERD (gastroesophageal reflux disease)    Headache    otc med prn - last one 2 months ago   New onset of congestive heart failure (HCC) 12/31/2022   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   PRES (posterior reversible encephalopathy syndrome)    Pseudotumor cerebri     Past Surgical History:  Procedure Laterality Date   ABDOMINAL HYSTERECTOMY     CESAREAN SECTION     x 2   CYSTOSCOPY N/A 06/19/2015   Procedure: CYSTOSCOPY;  Surgeon: Marylu Soda, MD;  Location: [facility];  Service: Gynecology;  Laterality: N/A;   GASTRECTOMY     sleeve, [facility]   IR GENERIC HISTORICAL  05/12/2016   IR FLUORO GUIDED NEEDLE PLC ASPIRATION/INJECTION LOC 05/12/2016 Bettylou Brunner, MD [facility]   LAPAROSCOPIC VAGINAL HYSTERECTOMY WITH SALPINGECTOMY Bilateral 06/19/2015   Procedure: LAPAROSCOPIC ASSISTED VAGINAL HYSTERECTOMY WITH SALPINGECTOMY;  Surgeon: Marylu Soda, MD;  Location: [facility];  Service: Gynecology;  Laterality: Bilateral;   lumbar pucture     x2   SACROILIAC JOINT FUSION Left 10/28/2017   Procedure: SACROILIAC JOINT FUSION;  Surgeon: Mort Ards, MD;  Location: [facility] OR;  Service: Orthopedics;  Laterality: Left;  90 mins   TONSILLECTOMY      MEDICATIONS:  ALPRAZolam  (XANAX  XR) 0.5 MG 24 hr tablet   cloNIDine  (CATAPRES ) 0.1 MG tablet   diphenhydrAMINE  (BENADRYL ) 25 MG tablet   famotidine (PEPCID) 10 MG tablet   HYDROcodone -acetaminophen  (NORCO) 7.5-325 MG tablet   niCARdipine (CARDENE) 30 MG capsule   ondansetron  (ZOFRAN -ODT) 8 MG disintegrating tablet   promethazine  (PHENERGAN ) 25 MG tablet   propranolol  ER (INDERAL  LA)  120 MG 24 hr capsule   Rimegepant Sulfate (NURTEC) 75 MG TBDP   sertraline  (ZOLOFT ) 100 MG tablet   spironolactone  (ALDACTONE ) 25 MG tablet   tiZANidine  (ZANAFLEX ) 4 MG tablet   Vitamin D, Ergocalciferol, (DRISDOL) 1.25 MG (50000 UNIT) CAPS capsule   zonisamide  (ZONEGRAN ) 100 MG capsule   No current facility-administered medications for this encounter.   Antoinette Kirschner [facility] Short Stay/Anesthesiology [place] Phone [phone] 07/16/2023 2:15 PM

## 2023-07-24 ENCOUNTER — Other Ambulatory Visit: Payer: Self-pay

## 2023-07-24 ENCOUNTER — Ambulatory Visit (HOSPITAL_COMMUNITY): Payer: Self-pay | Admitting: Medical

## 2023-07-24 ENCOUNTER — Encounter (HOSPITAL_COMMUNITY): Payer: Self-pay | Admitting: Orthopedic Surgery

## 2023-07-24 ENCOUNTER — Ambulatory Visit (HOSPITAL_COMMUNITY): Admitting: Anesthesiology

## 2023-07-24 ENCOUNTER — Encounter (HOSPITAL_COMMUNITY)
Admission: RE | Disposition: A | Discharge status: Home / Self Care | Payer: Self-pay | Source: Ambulatory Visit | Attending: Orthopedic Surgery

## 2023-07-24 ENCOUNTER — Ambulatory Visit (HOSPITAL_COMMUNITY)
Admission: RE | Admit: 2023-07-24 | Discharge: 2023-07-24 | Disposition: A | Discharge status: Home / Self Care | Source: Ambulatory Visit | Attending: Orthopedic Surgery | Admitting: Orthopedic Surgery

## 2023-07-24 DIAGNOSIS — F419 Anxiety disorder, unspecified: Secondary | ICD-10-CM | POA: Insufficient documentation

## 2023-07-24 DIAGNOSIS — D649 Anemia, unspecified: Secondary | ICD-10-CM | POA: Diagnosis not present

## 2023-07-24 DIAGNOSIS — G932 Benign intracranial hypertension: Secondary | ICD-10-CM | POA: Diagnosis not present

## 2023-07-24 DIAGNOSIS — M75121 Complete rotator cuff tear or rupture of right shoulder, not specified as traumatic: Secondary | ICD-10-CM | POA: Diagnosis present

## 2023-07-24 DIAGNOSIS — M75102 Unspecified rotator cuff tear or rupture of left shoulder, not specified as traumatic: Secondary | ICD-10-CM

## 2023-07-24 DIAGNOSIS — E669 Obesity, unspecified: Secondary | ICD-10-CM | POA: Diagnosis not present

## 2023-07-24 DIAGNOSIS — S42251A Displaced fracture of greater tuberosity of right humerus, initial encounter for closed fracture: Secondary | ICD-10-CM | POA: Insufficient documentation

## 2023-07-24 DIAGNOSIS — M7521 Bicipital tendinitis, right shoulder: Secondary | ICD-10-CM | POA: Diagnosis not present

## 2023-07-24 DIAGNOSIS — K219 Gastro-esophageal reflux disease without esophagitis: Secondary | ICD-10-CM | POA: Diagnosis not present

## 2023-07-24 DIAGNOSIS — R519 Headache, unspecified: Secondary | ICD-10-CM | POA: Insufficient documentation

## 2023-07-24 DIAGNOSIS — M25811 Other specified joint disorders, right shoulder: Secondary | ICD-10-CM | POA: Diagnosis not present

## 2023-07-24 DIAGNOSIS — Z6841 Body Mass Index (BMI) 40.0 and over, adult: Secondary | ICD-10-CM | POA: Diagnosis not present

## 2023-07-24 DIAGNOSIS — S42252A Displaced fracture of greater tuberosity of left humerus, initial encounter for closed fracture: Secondary | ICD-10-CM

## 2023-07-24 DIAGNOSIS — X58XXXA Exposure to other specified factors, initial encounter: Secondary | ICD-10-CM | POA: Diagnosis not present

## 2023-07-24 DIAGNOSIS — M19011 Primary osteoarthritis, right shoulder: Secondary | ICD-10-CM | POA: Diagnosis not present

## 2023-07-24 DIAGNOSIS — M7541 Impingement syndrome of right shoulder: Secondary | ICD-10-CM

## 2023-07-24 DIAGNOSIS — I509 Heart failure, unspecified: Secondary | ICD-10-CM | POA: Diagnosis not present

## 2023-07-24 DIAGNOSIS — Z79899 Other long term (current) drug therapy: Secondary | ICD-10-CM | POA: Insufficient documentation

## 2023-07-24 DIAGNOSIS — F32A Depression, unspecified: Secondary | ICD-10-CM | POA: Insufficient documentation

## 2023-07-24 HISTORY — PX: SHOULDER ARTHROSCOPY WITH ROTATOR CUFF REPAIR: SHX5685

## 2023-07-24 HISTORY — PX: BICEPT TENODESIS: SHX5116

## 2023-07-24 SURGERY — ARTHROSCOPY, SHOULDER, WITH ROTATOR CUFF REPAIR
Anesthesia: Regional | Laterality: Left

## 2023-07-24 MED ORDER — MIDAZOLAM HCL 2 MG/2ML IJ SOLN
2.0000 mg | INTRAMUSCULAR | Status: AC
  Administered 2023-07-24 (×2): 2 mg via INTRAVENOUS
  Filled 2023-07-24: qty 2

## 2023-07-24 MED ORDER — OXYCODONE HCL 5 MG PO TABS
ORAL_TABLET | ORAL | Status: AC
  Filled 2023-07-24: qty 1

## 2023-07-24 MED ORDER — CHLORHEXIDINE GLUCONATE 0.12 % MT SOLN
15.0000 mL | Freq: Once | OROMUCOSAL | Status: AC
  Administered 2023-07-24: 15 mL via OROMUCOSAL

## 2023-07-24 MED ORDER — BUPIVACAINE HCL (PF) 0.5 % IJ SOLN
INTRAMUSCULAR | Status: DC | PRN
  Administered 2023-07-24: 10 mL via PERINEURAL

## 2023-07-24 MED ORDER — FENTANYL CITRATE (PF) 100 MCG/2ML IJ SOLN
INTRAMUSCULAR | Status: DC | PRN
  Administered 2023-07-24 (×2): 50 ug via INTRAVENOUS

## 2023-07-24 MED ORDER — OXYCODONE HCL 5 MG/5ML PO SOLN: 5.0000 mg | Freq: Once | ORAL | Status: AC | PRN

## 2023-07-24 MED ORDER — PROPRANOLOL HCL ER 60 MG PO CP24
120.0000 mg | ORAL_CAPSULE | ORAL | Status: AC
Start: 2023-07-24 — End: 2023-07-24
  Administered 2023-07-24: 120 mg via ORAL
  Filled 2023-07-24 (×2): qty 2

## 2023-07-24 MED ORDER — ONDANSETRON 4 MG PO TBDP: 4.0000 mg | ORAL_TABLET | Freq: Three times a day (TID) | ORAL | 0 refills | Status: DC | PRN

## 2023-07-24 MED ORDER — ONDANSETRON HCL 4 MG/2ML IJ SOLN
INTRAMUSCULAR | Status: DC | PRN
Start: 2023-07-24 — End: 2023-07-24
  Administered 2023-07-24: 4 mg via INTRAVENOUS

## 2023-07-24 MED ORDER — ONDANSETRON 8 MG PO TBDP: ORAL_TABLET | ORAL | 0 refills | Status: AC

## 2023-07-24 MED ORDER — MIDAZOLAM HCL 2 MG/2ML IJ SOLN
INTRAMUSCULAR | Status: AC
  Filled 2023-07-24: qty 2

## 2023-07-24 MED ORDER — SODIUM CHLORIDE 0.9 % IR SOLN
Status: DC | PRN
  Administered 2023-07-24: 12000 mL

## 2023-07-24 MED ORDER — ACETAMINOPHEN 10 MG/ML IV SOLN
1000.0000 mg | Freq: Once | INTRAVENOUS | Status: DC | PRN
  Administered 2023-07-24: 1000 mg via INTRAVENOUS

## 2023-07-24 MED ORDER — ACETAMINOPHEN 10 MG/ML IV SOLN
INTRAVENOUS | Status: AC
  Filled 2023-07-24: qty 100

## 2023-07-24 MED ORDER — EPINEPHRINE PF 1 MG/ML IJ SOLN
INTRAMUSCULAR | Status: AC
  Filled 2023-07-24: qty 1

## 2023-07-24 MED ORDER — ONDANSETRON HCL 4 MG/2ML IJ SOLN
INTRAMUSCULAR | Status: AC
  Filled 2023-07-24: qty 2

## 2023-07-24 MED ORDER — EPINEPHRINE 1 MG/ML IJ SOLN
INTRAMUSCULAR | Status: DC | PRN
  Administered 2023-07-24: 1 mg

## 2023-07-24 MED ORDER — OXYCODONE HCL 5 MG PO TABS: 5.0000 mg | ORAL_TABLET | ORAL | 0 refills | Status: DC | PRN

## 2023-07-24 MED ORDER — ROCURONIUM BROMIDE 100 MG/10ML IV SOLN
INTRAVENOUS | Status: DC | PRN
  Administered 2023-07-24: 70 mg via INTRAVENOUS

## 2023-07-24 MED ORDER — BUPIVACAINE LIPOSOME 1.3 % IJ SUSP
INTRAMUSCULAR | Status: DC | PRN
  Administered 2023-07-24: 10 mL via PERINEURAL

## 2023-07-24 MED ORDER — DEXMEDETOMIDINE HCL IN NACL 80 MCG/20ML IV SOLN
INTRAVENOUS | Status: DC | PRN
  Administered 2023-07-24 (×2): 10 ug via INTRAVENOUS

## 2023-07-24 MED ORDER — FENTANYL CITRATE (PF) 100 MCG/2ML IJ SOLN
INTRAMUSCULAR | Status: AC
  Filled 2023-07-24: qty 2

## 2023-07-24 MED ORDER — DEXAMETHASONE SODIUM PHOSPHATE 10 MG/ML IJ SOLN
INTRAMUSCULAR | Status: AC
  Filled 2023-07-24: qty 1

## 2023-07-24 MED ORDER — OXYCODONE HCL 5 MG PO TABS
5.0000 mg | ORAL_TABLET | Freq: Once | ORAL | Status: AC | PRN
  Administered 2023-07-24: 5 mg via ORAL

## 2023-07-24 MED ORDER — ORAL CARE MOUTH RINSE: 15.0000 mL | Freq: Once | OROMUCOSAL | Status: AC

## 2023-07-24 MED ORDER — FENTANYL CITRATE PF 50 MCG/ML IJ SOSY
25.0000 ug | PREFILLED_SYRINGE | INTRAMUSCULAR | Status: DC | PRN
  Administered 2023-07-24: 25 ug via INTRAVENOUS

## 2023-07-24 MED ORDER — FENTANYL CITRATE PF 50 MCG/ML IJ SOSY
100.0000 ug | PREFILLED_SYRINGE | INTRAMUSCULAR | Status: DC
  Filled 2023-07-24: qty 2

## 2023-07-24 MED ORDER — CEFAZOLIN SODIUM-DEXTROSE 2-4 GM/100ML-% IV SOLN
2.0000 g | INTRAVENOUS | Status: AC
  Administered 2023-07-24: 2 g via INTRAVENOUS
  Filled 2023-07-24: qty 100

## 2023-07-24 MED ORDER — BUPIVACAINE-EPINEPHRINE (PF) 0.25% -1:200000 IJ SOLN
INTRAMUSCULAR | Status: AC
  Filled 2023-07-24: qty 30

## 2023-07-24 MED ORDER — SUGAMMADEX SODIUM 200 MG/2ML IV SOLN
INTRAVENOUS | Status: DC | PRN
  Administered 2023-07-24 (×2): 100 mg via INTRAVENOUS

## 2023-07-24 MED ORDER — FENTANYL CITRATE PF 50 MCG/ML IJ SOSY
PREFILLED_SYRINGE | INTRAMUSCULAR | Status: AC
  Filled 2023-07-24: qty 1

## 2023-07-24 MED ORDER — PROPOFOL 10 MG/ML IV BOLUS
INTRAVENOUS | Status: DC | PRN
  Administered 2023-07-24: 200 mg via INTRAVENOUS

## 2023-07-24 MED ORDER — LACTATED RINGERS IV SOLN: INTRAVENOUS | Status: DC

## 2023-07-24 MED ORDER — DEXAMETHASONE SODIUM PHOSPHATE 10 MG/ML IJ SOLN
INTRAMUSCULAR | Status: DC | PRN
  Administered 2023-07-24: 4 mg via INTRAVENOUS

## 2023-07-24 SURGICAL SUPPLY — 57 items
ANCHOR SUT FBRTK 2.6 SOFT 1.7 (Anchor) IMPLANT
ANCHOR SWIVELOCK SP KL 4.75 (Anchor) IMPLANT
BAG COUNTER SPONGE SURGICOUNT (BAG) ×2 IMPLANT
BURR OVAL 8 FLU 4.0X13 (MISCELLANEOUS) ×2 IMPLANT
CANNULA 5.75X7 CRYSTAL CLEAR (CANNULA) IMPLANT
CANNULA 5.75X71 LONG (CANNULA) IMPLANT
CANNULA PASSPORT BUTTON 8X4 (CANNULA) IMPLANT
CANNULA TWIST IN 8.25X7CM (CANNULA) IMPLANT
CLSR STERI-STRIP ANTIMIC 1/2X4 (GAUZE/BANDAGES/DRESSINGS) IMPLANT
DISSECTOR 3.8MM X 13CM (MISCELLANEOUS) IMPLANT
DRAPE SHEET LG 3/4 BI-LAMINATE (DRAPES) ×2 IMPLANT
DRAPE STERI 35X30 U-POUCH (DRAPES) ×2 IMPLANT
DRAPE SURG 17X23 STRL (DRAPES) ×2 IMPLANT
DRAPE SURG ORHT 6 SPLT 77X108 (DRAPES) ×4 IMPLANT
DRAPE U-SHAPE 47X51 STRL (DRAPES) ×2 IMPLANT
DRSG XEROFORM 1X8 (GAUZE/BANDAGES/DRESSINGS) IMPLANT
DURAPREP 26ML APPLICATOR (WOUND CARE) ×2 IMPLANT
ELECT PENCIL ROCKER SW 15FT (MISCELLANEOUS) ×2 IMPLANT
ELECT REM PT RETURN 15FT ADLT (MISCELLANEOUS) IMPLANT
FIBERSTICK 2 (SUTURE) IMPLANT
GAUZE PAD ABD 8X10 STRL (GAUZE/BANDAGES/DRESSINGS) ×6 IMPLANT
GAUZE SPONGE 4X4 12PLY STRL (GAUZE/BANDAGES/DRESSINGS) ×2 IMPLANT
GLOVE BIO SURGEON STRL SZ7.5 (GLOVE) ×4 IMPLANT
GLOVE BIOGEL PI IND STRL 8 (GLOVE) ×4 IMPLANT
GOWN STRL REUS W/ TWL XL LVL3 (GOWN DISPOSABLE) ×4 IMPLANT
IV NS IRRIG 3000ML ARTHROMATIC (IV SOLUTION) ×4 IMPLANT
KIT ANCHOR FBRTK 2.6 STR (KITS) IMPLANT
KIT BASIN OR (CUSTOM PROCEDURE TRAY) ×2 IMPLANT
KIT TURNOVER KIT A (KITS) IMPLANT
MANIFOLD NEPTUNE II (INSTRUMENTS) ×2 IMPLANT
NDL 1/2 CIR CATGUT .05X1.09 (NEEDLE) IMPLANT
NDL HD SCORPION MEGA LOADER (NEEDLE) IMPLANT
NDL SAFETY ECLIPSE 18X1.5 (NEEDLE) IMPLANT
NEEDLE 1/2 CIR CATGUT .05X1.09 (NEEDLE) IMPLANT
PACK ARTHROSCOPY WL (CUSTOM PROCEDURE TRAY) ×2 IMPLANT
PROBE BIPOLAR ATHRO 135MM 90D (MISCELLANEOUS) IMPLANT
SLEEVE ARM SUSPENSION SYSTEM (MISCELLANEOUS) ×2 IMPLANT
SLING S3 LATERAL DISP (MISCELLANEOUS) ×2 IMPLANT
SLING ULTRA II L (ORTHOPEDIC SUPPLIES) IMPLANT
SLING ULTRA III MED (ORTHOPEDIC SUPPLIES) IMPLANT
SPONGE T-LAP 4X18 ~~LOC~~+RFID (SPONGE) IMPLANT
SUT MNCRL AB 3-0 PS2 27 (SUTURE) ×2 IMPLANT
SUT PDS AB 0 CT1 36 (SUTURE) IMPLANT
SUT TIGER TAPE 7 IN WHITE (SUTURE) IMPLANT
SUT VIC AB 0 CT1 36 (SUTURE) ×2 IMPLANT
SUT VIC AB 2-0 CT1 TAPERPNT 27 (SUTURE) IMPLANT
SUTURE 2 FIBERLOOP 20 STRT BLU (SUTURE) IMPLANT
SUTURE FIBERWR #2 38 T-5 BLUE (SUTURE) IMPLANT
SYR 27GX1/2 1ML LL SAFETY (SYRINGE) IMPLANT
SYSTEM TENODESIS BC LNT 3.9 (Orthopedic Implant) IMPLANT
TAPE CLOTH SOFT 2X10 (GAUZE/BANDAGES/DRESSINGS) IMPLANT
TAPE CLOTH SURG 6X10 NS LF (GAUZE/BANDAGES/DRESSINGS) ×2 IMPLANT
TOWEL OR 17X26 10 PK STRL BLUE (TOWEL DISPOSABLE) ×2 IMPLANT
TUBING ARTHROSCOPY IRRIG 16FT (MISCELLANEOUS) ×4 IMPLANT
TUBING CONNECTING 10 (TUBING) ×4 IMPLANT
WAND ABLATOR APOLLO I90 (BUR) ×2 IMPLANT
WATER STERILE IRR 500ML POUR (IV SOLUTION) ×2 IMPLANT

## 2023-07-24 NOTE — Op Note (Addendum)
 Date of Surgery: 07/24/2023  INDICATIONS: [name] is a 43 y.o.-year-old female with a left traumatic full-thickness rotator cuff tear associated with an inferior dislocation that resulted in a superior Hill-Sachs lesion with associated avulsion fracture of the greater tuberosity as well as biceps tendinopathy.  Here today for arthroscopic assisted surgery.;  The patient did consent to the procedure after discussion of the risks and benefits.  PREOPERATIVE DIAGNOSIS:  1.  Right shoulder rotator cuff tear 2.  Right shoulder proximal biceps tendinitis 3.  Right shoulder subacromial impingement 4.  Right shoulder acromioclavicular joint arthropathy 5.  Right shoulder greater tuberosity fracture  POSTOPERATIVE DIAGNOSIS: Same.  PROCEDURE:  1.  Right shoulder arthroscopic extensive debridement of rotator interval, superior labrum, anterior labrum as well as posterior labrum and subacromial bursectomy 2.  Right shoulder arthroscopic biceps tenodesis 3.  Right shoulder arthroscopic rotator cuff repair 4.  Right shoulder subacromial decompression 5.  Right shoulder arthroscopic distal clavicle resection of 1 cm of distal clavicle  SURGEON: Brigitte Canard, M.D.  ASSIST: Karyl Paget, PA-C  Assistant attestation:  PA Mcclung present and scrubbed for the entire procedure..  ANESTHESIA:  general, interscalene block with Exparel   IV FLUIDS AND URINE: See anesthesia.  ESTIMATED BLOOD LOSS: 10 mL.  IMPLANTS: 3.9 mm Arthrex swivel lock anchor for biceps tenodesis One 2.6 mm Arthrex fiber tack rotator cuff anchor for medial row 2 separate 4.75 mm Arthrex self punching swivel lock anchors for lateral row  DRAINS: None  COMPLICATIONS: None.  DESCRIPTION OF PROCEDURE: The patient was brought to the operating room and placed supine on the operating table.  The patient had been signed prior to the procedure and this was documented. The patient had the anesthesia placed by the  anesthesiologist.  A time-out was performed to confirm that this was the correct patient, site, side and location. The patient did receive antibiotics prior to the incision and was re-dosed during the procedure as needed at indicated intervals.  A tourniquet was not placed.  The patient had the operative extremity prepped and draped in the standard surgical fashion.      After obtaining informed consent the patient was brought to the operating table and underwent satisfactory anesthesia. An exam under anesthesia revealed full range of motion. He was placed in the left lateral decubitus position with an axillary roll and all bony prominences properly padded. A standard surgical timeout was performed.  She was placed in 10 pounds of gentle in-line suspension.  Standard posterior and anterior superior portals were established. A diagnostic evaluation of the glenohumeral joint was performed. The biceps tendon was markedly synovitic and partially torn.  There was a supraspinatus tendon full-thickness tear.  The subscapularis was intact.  Teres minor and infraspinatus likewise intact.  There was tearing noted of the anterior labrum as well as superior labrum and posterior labrum.  Glenohumeral joint cartilage was intact.  No loose bodies noted.  We stopped the mid glenoid working portal and established a cannula to work through.  Arthroscopic biceps tenodesis was carried out with a loop and tack technique.  We placed a fiber loop suture around the biceps tendon in a luggage tag fashion and then pierced just distal to that with the antegrade suture passer.  We then tenotomized the tendon and loaded the tail of the suture into a 3.9 mm swivel lock anchor.  This was then secured just along the superior border of the subscapularis.  This had excellent fixation.  Next, we extensively debrided the joint  and motorized shaver utilizing shaver through the mid glenoid portal we resected unstable glenoid labral tissue on  the anterior, superior and posterior aspects.  We also widely excised rotator interval tissue.  The arthroscope was inserted in the subacromial space and an additional lateral portal was established. An acromioplasty performed nicely decompressing the subacromial space with a motorized burr. Bursitis in the subacromial space was removed as well as releases were performed on the bursal surface of the rotator cuff.    Next we completed arthroscopic rotator cuff repair.  Tear dimensions were 1.5 cm x 1.5 cm.  This was the rotator cuff tear associated with the previously noted greater tuberosity fracture from her inferior dislocation.  We released scar tissue from the greater tuberosity and identified some healed greater tuberosity bone that was undulating but relatively well-healed and very solid.  Next we prepared the greater tuberosity for repair with motorized shaver to elicit some petechial bleeding.  We then placed a 2.6 mm Arthrex knotless rotator cuff fiber tack anchor at the articular margin.  We then made 4 passes with the suture tape and fiber tape through the myotendinous junction.  These were then divided and loaded into 2 separate lateral row anchors.  The self punching 4.75 mm swivel lock anchors were loaded and tension in the tendon and impacted into the lateral greater tuberosity bone.  This had excellent fixation.  Lastly, we moved our attention to the distal clavicle.  We performed a subperiosteal dissection with radiofrequency wand while working through the mid glenoid portal but viewing from the lateral portal.  We then used a motorized bur to resect 1 cm of distal clavicle.  . This completed the rotator cuff repair.  The arthroscope was then removed and portals closed with 4-0 Monocryl in standard fashion followed by a sterile occlusive dressing Polar Care ice sleeve and a slingshot sling. The patient was sent to recovery in stable condition and tolerated the procedure  well  POSTOPERATIVE PLAN:  We we will place Ms. Scarlet in a sling for the operative extremity.  She will be on the less than 3 centimeter rotator cuff repair protocol.  Discharge home today.  See her back in 2 weeks.

## 2023-07-24 NOTE — Discharge Instructions (Addendum)
Orthopedic surgery discharge instructions:  -Maintain postoperative bandages for 3 days.  You may remove these bandages on post op day 3 and begin showering at that time.  Please do not submerge underwater.  -Maintain your arm in sling at all times.  You should only remove for showering and getting dressed.  No lifting with the operative arm.  -For mild to moderate pain use Tylenol and Advil in alternating fashion around-the-clock.  For breakthrough pain use oxycodone as necessary.  -Please apply ice to the right shoulder for 20-30 minutes out of each hour that you are awake.  Do this around-the-clock for the first 3 days from surgery.  -Follow-up in 2 weeks for routine postoperative check.  

## 2023-07-24 NOTE — Anesthesia Postprocedure Evaluation (Signed)
 Anesthesia Post Note  Patient: Shaniece J Donaldson  Procedure(s) Performed: ARTHROSCOPY, SHOULDER, WITH ROTATOR CUFF REPAIR (Left) TENODESIS, BICEPS (Left)     Patient location during evaluation: PACU Anesthesia Type: Regional and General Level of consciousness: awake and alert Pain management: pain level controlled Vital Signs Assessment: post-procedure vital signs reviewed and stable Respiratory status: spontaneous breathing, nonlabored ventilation, respiratory function stable and patient connected to nasal cannula oxygen Cardiovascular status: blood pressure returned to baseline and stable Postop Assessment: no apparent nausea or vomiting Anesthetic complications: no   No notable events documented.  Last Vitals:  Vitals:   07/24/23 1306 07/24/23 1400  BP: (!) 131/90 97/66  Pulse: 70 77  Resp: 18 20  Temp: (!) 36.4 C (!) 36.4 C  SpO2: 100% 100%    Last Pain:  Vitals:   07/24/23 1400  TempSrc:   PainSc: 3                  Lethaniel Rave

## 2023-07-24 NOTE — H&P (Signed)
 [facility]TH[facility]IC [facility]  REQUESTING PHYSICIAN: Janeth Medicus, MD  PCP:  G[place], PA-C  Chief Complaint: left shoulder pain  HPI: [name] is a 43 y.o. female who complains of left shoulder pain followin injury.  Here today for surgery.  Past Medical History:  Diagnosis Date   ADHD (attention deficit hyperactivity disorder)    ADHD, adult residual type    Anemia    Anxiety    Depression    Family history of adverse reaction to anesthesia    sister has PONV   GERD (gastroesophageal reflux disease)    Headache    otc med prn - last one 2 months ago   New onset of congestive heart failure (HCC) 12/31/2022   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   PRES (posterior reversible encephalopathy syndrome)    Pseudotumor cerebri    Past Surgical History:  Procedure Laterality Date   ABDOMINAL HYSTERECTOMY     CESAREAN SECTION     x 2   CYSTOSCOPY N/A 06/19/2015   Procedure: CYSTOSCOPY;  Surgeon: Marylu Soda, MD;  Location: [facility];  Service: Gynecology;  Laterality: N/A;   GASTRECTOMY     sleeve, [facility]   IR GENERIC HISTORICAL  05/12/2016   IR FLUORO GUIDED NEEDLE PLC ASPIRATION/INJECTION LOC 05/12/2016 Bettylou Brunner, MD [facility]   LAPAROSCOPIC VAGINAL HYSTERECTOMY WITH SALPINGECTOMY Bilateral 06/19/2015   Procedure: LAPAROSCOPIC ASSISTED VAGINAL HYSTERECTOMY WITH SALPINGECTOMY;  Surgeon: Marylu Soda, MD;  Location: [facility];  Service: Gynecology;  Laterality: Bilateral;   lumbar pucture     x2   SACROILIAC JOINT FUSION Left 10/28/2017   Procedure: SACROILIAC JOINT FUSION;  Surgeon: Mort Ards, MD;  Location: [facility] OR;  Service: Orthopedics;  Laterality: Left;  90 mins   TONSILLECTOMY     Social History   Socioeconomic History   Marital status: Married    Spouse name: Not on file   Number of children: 2   Years of education: cosmetology   Highest education level: Not on file  Occupational History   Occupation: Homemaker  Tobacco Use    Smoking status: Never   Smokeless tobacco: Never  Vaping Use   Vaping status: Never Used  Substance and Sexual Activity   Alcohol use: Not Currently   Drug use: No   Sexual activity: Yes    Birth control/protection: None    Comment: Husband had vasectomy  Other Topics Concern   Not on file  Social History Narrative   Lives at home with her husband and children.   Right-handed.   3-4 cups caffeine  per day.   Social Drivers of Corporate investment banker Strain: Low Risk  (06/05/2022)   Received from [facility], [facility]   Overall Financial Resource Strain (CARDIA)    Difficulty of Paying Living Expenses: Not hard at all  Food Insecurity: No Food Insecurity (12/31/2022)   Hunger Vital Sign    Worried About Running Out of Food in the Last Year: Never true    Ran Out of Food in the Last Year: Never true  Transportation Needs: No Transportation Needs (12/31/2022)   PRAPARE - Administrator, Civil Service (Medical): No    Lack of Transportation (Non-Medical): No  Physical Activity: Not on file  Stress: Not on file  Social Connections: Unknown (07/01/2021)   Received from [facility], [facility]   Social Network    Social Network: Not on file   Family History  Problem Relation  Age of Onset   Pneumonia Mother        Sepsis   Heart attack Father    Allergies  Allergen Reactions   Aspirin Other (See Comments)    Stomach pain Severe abdominal pain   Duloxetine Hcl Other (See Comments)    Blisters, "shut kidneys down"  Cymbalta*   Lorazepam  Other (See Comments)    "Can't remember anything while on it" aggressive    Naproxen  Nausea And Vomiting and Other (See Comments)    cramping   Penicillins Hives    Has patient had a PCN reaction causing immediate rash, facial/tongue/throat swelling, SOB or lightheadedness with hypotension: No Has patient had a PCN reaction causing severe rash involving mucus membranes or skin necrosis: No Has patient had a PCN  reaction that required hospitalization No Has patient had a PCN reaction occurring within the last 10 years: No If all of the above answers are "NO", then may proceed with Cephalosporin use.   Keflex  [Cephalexin ]     Unknown reaction- Possibly causes thrush or yeast infection    Buspar [Buspirone] Other (See Comments)    "felt weird, heart raced, cloudy"   Macrobid [Nitrofurantoin Monohyd Macro] Nausea Only and Other (See Comments)    Stomach pain   Sudafed [Pseudoephedrine Hcl] Hives    Insomnia    Topiramate  Other (See Comments)    Numbness/tingling in mouth/feet   Prior to Admission medications   Medication Sig Start Date End Date Taking? Authorizing Provider  ALPRAZolam  (XANAX  XR) 0.5 MG 24 hr tablet Take 0.5 mg by mouth daily. 04/22/19  Yes [provider]  cloNIDine  (CATAPRES ) 0.1 MG tablet Take 1 tablet (0.1 mg total) by mouth 3 (three) times daily. Patient taking differently: Take 0.3 mg by mouth 3 (three) times daily. 09/16/22  Yes Delo, Dufm Gibbon, MD  diphenhydrAMINE  (BENADRYL ) 25 MG tablet Take 25-50 mg by mouth daily as needed for allergies.   Yes [provider]  famotidine (PEPCID) 10 MG tablet Take 10 mg by mouth daily as needed for heartburn or indigestion.   Yes [provider]  HYDROcodone -acetaminophen  (NORCO) 7.5-325 MG tablet Take 1 tablet by mouth every 6 (six) hours as needed for moderate pain (pain score 4-6).   Yes [provider]  niCARdipine (CARDENE) 30 MG capsule Take 30 mg by mouth 3 (three) times daily.   Yes [provider]  propranolol  ER (INDERAL  LA) 120 MG 24 hr capsule Take 120 mg by mouth daily.   Yes [provider]  Rimegepant Sulfate (NURTEC) 75 MG TBDP Take 1 tablet (75 mg total) by mouth daily as needed (1 tablet at onset of migraine, no more that 1 tablet in 24 hours). 02/18/23  Yes Wess Hammed, NP  sertraline  (ZOLOFT ) 100 MG tablet Take 100 mg by mouth daily. 11/26/20  Yes [provider]   spironolactone  (ALDACTONE ) 25 MG tablet Take 25 mg by mouth daily.   Yes [provider]  tiZANidine  (ZANAFLEX ) 4 MG tablet Take 1 tablet (4 mg total) by mouth every 6 (six) hours as needed for muscle spasms. 11/18/22  Yes Wess Hammed, NP  Vitamin D, Ergocalciferol, (DRISDOL) 1.25 MG (50000 UNIT) CAPS capsule Take 50,000 Units by mouth 2 (two) times a week. Thursday and Sunday   Yes [provider]  zonisamide  (ZONEGRAN ) 100 MG capsule Take 1 capsule (100 mg total) by mouth at bedtime. 11/18/22  Yes Wess Hammed, NP  ondansetron  (ZOFRAN -ODT) 8 MG disintegrating tablet 8mg  ODT q4 hours  prn nausea 09/16/22   Orvilla Blander, MD  promethazine  (PHENERGAN ) 25 MG tablet Take 1 tablet (25 mg total) by mouth every 6 (six) hours as needed for nausea or vomiting. 05/19/23   Wess Hammed, NP   No results found.  Positive ROS: All other systems have been reviewed and were otherwise negative with the exception of those mentioned in the HPI and as above.  Physical Exam: General: Alert, no acute distress Cardiovascular: No pedal edema Respiratory: No cyanosis, no use of accessory musculature GI: No organomegaly, abdomen is soft and non-tender Skin: No lesions in the area of chief complaint Neurologic: Sensation intact distally Psychiatric: Patient is competent for consent with normal mood and affect Lymphatic: No axillary or cervical lymphadenopathy  MUSCULOSKELETAL: LUE-  small scaratches posterior shoulder.  Otherwise nvi, wwp  Assessment: Left should rotator cuff tear  Plan: Surgery today for repairs as indicated.  The risks, benefits, and alternatives were discussed with the patient. There are risks associated with the surgery including, but not limited to, problems with anesthesia (death), infection, differences in leg length/angulation/rotation, fracture of bones, loosening or failure of implants, malunion, nonunion, hematoma (blood accumulation) which may require surgical  drainage, blood clots, pulmonary embolism, nerve injury (foot drop), and blood vessel injury. The patient understands these risks and elects to proceed.   Dc home post op    Janeth Medicus, MD Cell [phone]    07/24/2023 9:16 AM

## 2023-07-24 NOTE — Anesthesia Procedure Notes (Signed)
 Anesthesia Regional Block: Interscalene brachial plexus block   Pre-Anesthetic Checklist: , timeout performed,  Correct Patient, Correct Site, Correct Laterality,  Correct Procedure, Correct Position, site marked,  Risks and benefits discussed,  Surgical consent,  Pre-op evaluation,  At surgeon's request and post-op pain management  Laterality: Left  Prep: chloraprep       Needles:  Injection technique: Single-shot  Needle Type: Echogenic Stimulator Needle     Needle Length: 9cm  Needle Gauge: 21     Additional Needles:   Procedures:,,,, ultrasound used (permanent image in chart),,    Narrative:  Start time: 07/24/2023 10:00 AM End time: 07/24/2023 10:05 AM Injection made incrementally with aspirations every 5 mL.  Performed by: Personally  Anesthesiologist: Lethaniel Rave, MD  Additional Notes: Discussed risks and benefits of the nerve block in detail, including but not limited vascular injury, permanent nerve damage and infection.   Patient tolerated the procedure well. Local anesthetic introduced in an incremental fashion under minimal resistance after negative aspirations. No paresthesias were elicited. After completion of the procedure, no acute issues were identified and patient continued to be monitored by RN.

## 2023-07-24 NOTE — Transfer of Care (Signed)
 Immediate Anesthesia Transfer of Care Note  Patient: [name]  Procedure(s) Performed: ARTHROSCOPY, SHOULDER, WITH ROTATOR CUFF REPAIR (Left) TENODESIS, BICEPS (Left)  Patient Location: PACU  Anesthesia Type:General  Level of Consciousness: awake and alert   Airway & Oxygen Therapy: Patient Spontanous Breathing  Post-op Assessment: Report given to RN  Post vital signs: Reviewed and stable  Last Vitals:  Vitals Value Taken Time  BP 122/74 07/24/23 1154  Temp    Pulse 83 07/24/23 1158  Resp 18 07/24/23 1158  SpO2 100 % 07/24/23 1158  Vitals shown include unfiled device data.  Last Pain: There were no vitals filed for this visit.       Complications: No notable events documented.

## 2023-07-24 NOTE — Brief Op Note (Signed)
 07/24/2023  11:47 AM  PATIENT:  [name]  43 y.o. female  PRE-OPERATIVE DIAGNOSIS:  Left shoulder greater tuberosity nonunion, rotator cuff tear  POST-OPERATIVE DIAGNOSIS:  Left shoulder greater tuberosity nonunion, rotator cuff tear  PROCEDURE:  Procedure(s): ARTHROSCOPY, SHOULDER, WITH ROTATOR CUFF REPAIR (Left) TENODESIS, BICEPS (Left), subacromial decompression, distal clavicle resection.  SURGEON:  Surgeons and Role:    * Hiram Lukes, Arch Ko, MD - Primary  PHYSICIAN ASSISTANT: Karyl Paget, PA-C   ANESTHESIA:   regional and general  EBL:  5 mL   BLOOD ADMINISTERED:none  DRAINS: none   LOCAL MEDICATIONS USED:  NONE  SPECIMEN:  No Specimen  DISPOSITION OF SPECIMEN:  N/A  COUNTS:  YES  TOURNIQUET:  * No tourniquets in log *  DICTATION: .Note written in EPIC  PLAN OF CARE: Discharge to home after PACU  PATIENT DISPOSITION:  PACU - hemodynamically stable.   Delay start of Pharmacological VTE agent (>24hrs) due to surgical blood loss or risk of bleeding: not applicable

## 2023-07-24 NOTE — Anesthesia Procedure Notes (Signed)
 Procedure Name: Intubation Date/Time: 07/24/2023 10:30 AM  Performed by: Raylene Calamity, CRNAPre-anesthesia Checklist: Patient identified, Emergency Drugs available, Suction available and Patient being monitored Patient Re-evaluated:Patient Re-evaluated prior to induction Oxygen Delivery Method: Circle System Utilized Preoxygenation: Pre-oxygenation with 100% oxygen Induction Type: IV induction Ventilation: Mask ventilation without difficulty Laryngoscope Size: Miller and 2 Grade View: Grade I Tube type: Oral Tube size: 7.0 mm Number of attempts: 1 Airway Equipment and Method: Stylet and Oral airway Placement Confirmation: ETT inserted through vocal cords under direct vision, positive ETCO2, breath sounds checked- equal and bilateral and CO2 detector Secured at: 20 cm Tube secured with: Tape Dental Injury: Teeth and Oropharynx as per pre-operative assessment

## 2023-07-28 ENCOUNTER — Encounter (HOSPITAL_COMMUNITY): Payer: Self-pay | Admitting: Orthopedic Surgery

## 2023-12-29 ENCOUNTER — Encounter: Payer: Self-pay | Admitting: Neurology

## 2023-12-29 ENCOUNTER — Ambulatory Visit: Admitting: Neurology

## 2023-12-29 VITALS — BP 131/84 | HR 71 | Ht 64.0 in | Wt 230.0 lb

## 2023-12-29 DIAGNOSIS — G932 Benign intracranial hypertension: Secondary | ICD-10-CM | POA: Diagnosis not present

## 2023-12-29 DIAGNOSIS — G43709 Chronic migraine without aura, not intractable, without status migrainosus: Secondary | ICD-10-CM

## 2023-12-29 DIAGNOSIS — I6783 Posterior reversible encephalopathy syndrome: Secondary | ICD-10-CM | POA: Diagnosis not present

## 2023-12-29 MED ORDER — TIZANIDINE HCL 4 MG PO TABS: 4.0000 mg | ORAL_TABLET | Freq: Four times a day (QID) | ORAL | 1 refills | Status: AC | PRN

## 2023-12-29 MED ORDER — PROMETHAZINE HCL 25 MG PO TABS: 25.0000 mg | ORAL_TABLET | Freq: Four times a day (QID) | ORAL | 1 refills | Status: AC | PRN

## 2023-12-29 MED ORDER — ZONISAMIDE 100 MG PO CAPS: 100.0000 mg | ORAL_CAPSULE | Freq: Every day | ORAL | 3 refills | Status: AC

## 2023-12-29 MED ORDER — NURTEC 75 MG PO TBDP: 1.0000 | ORAL_TABLET | Freq: Every day | ORAL | 11 refills | Status: AC | PRN

## 2023-12-29 NOTE — Patient Instructions (Addendum)
 Follow up with eye doctor, needs an eye exam  Continue weight loss  Continue current medications  Follow up in 8 months

## 2023-12-29 NOTE — Progress Notes (Signed)
 Patient: [name] Date of Birth: 1980-11-15  Reason for Visit: Follow up History from: Patient Primary Neurologist: Onita  ASSESSMENT AND PLAN 43 y.o. year old female   1.  Posterior reversible encephalopathy syndrome 2.  Hypertensive encephalopathy 3.  History of pseudotumor cerebri 4.  Chronic migraine headaches  - Continues to do very well, migraines under excellent control about 1 a month, has lost 15 lbs since last seen  - Continue Zonegran  100 mg at bedtime for migraine preventative - Continue Nurtec 75 mg tablet as needed for acute headache, avoid triptan due to uncontrolled hypertension.  I refilled her Phenergan  to take as needed, tizanidine  if severe migraine - MRI of the brain with and without contrast in January 2025 was normal, previous reported patchy edematous areas consistent with PRES had resolved -EEG was normal -Recommend follow-up with ophthalmology with history of IH -Close follow-up with primary care for strict management of blood pressure -Follow-up in 8 months or sooner if needed  IMPRESSION: Normal MRI scan of the brain with and without contrast.  Upon comparison with previous MRI films from 12/31/2022 the previously described frontal and parieto-occipital cortical T2 hyperintensities have resolved completely.    CONCLUSION: This is a  normal awake EEG.  There is no electrodiagnostic evidence of epileptiform discharge.  HISTORY  [name] is a 43 years old right-handed female, accompanied by her husband [name], seen in refer by her primary care physician nurse practitioner Burnard Ellen for evaluation of pseudotumor cerebri, initial evaluation was on May 06 2016.   I saw her in 2011, She had a history of hypertension during pregnancy, postpartum depression, frequent headaches postpartum day, there was mild evidence of papillary edema on examination, she was referred for CT head without contrast that was normal in December 2011, lumbar  puncture on February 06 2010, patient reported elevated open pressure 44 cm water , she later developed difficulty urination, low back pain, had MRI of lumbar on February 08 2010, that was normal.   I reviewed and summarized the referring note, reported a history of ADHD since age 67, only tolerate Adderall extended release, anxiety, currently taking Wellbutrin  300 mg daily, recently started on BuSpar 15 mg twice a day, Klonopin 1 mg twice a day as needed, also taking instant release Adderall 5 mg one tablet in the afternoon   She  lost insurance has lost follow-up since 2011, today she came in complains of gradual worsening headaches since 2016, now she has daily left occipital headache, spreading forward to become left retro-orbital area severe pounding headache with associated light noise sensitivity, nauseous, she went to emergency room about 6 times over the past 12 months for headache treatment, she also had steady weight gain, complains of transient difficulty focusing with sudden body positional change. She denies tinnitus, no hearing loss.  UPDATE Jul 07 2016:YY She had a fluoroscopy guided lumbar puncture on May 07 2016, open pressure was 23 cm water , closing pressure was 12 cm water .   Post LP she developed severe positional related headache, require hospital admission, was treated with Morphine  as needed, also received scheduled Percocet, Fioricet . She eventually had a blood patch on March twelfth 2018.   She could not tolerate Topamax , she complains of bilateral feet neuropathic pain, numbness tingling, also complains of alternating of the taste especially with soda, and her mouth feel raw, she could not tolerate it, she was put on Zonegran  100 mg twice a day since and of March 2018, she could not  tolerate it better, she noticed improvement of her headaches.   Over the past 2 months, on average he has one or two headache each week, left-sided lateralized severe pounding headache with associated  light noise sensitivity, nauseous, she is now taking Maxalt  10 mg, sometimes Fioricet  as needed, this will usually take care of the headache within one hour.   she is planning on to have bariatric surgery soon. have bariatric evaluation Jul 28 2016.   Update November 18, 2022 SS:  Patient was in the ER 11/08/22 for migraine headache, given migraine cocktail with good improvement.      Remains on Zonegran  100 mg at bedtime, Amerge provided brief relief, would return. Having 3-4 migraines a month, can last 1-2 days. Summer is worse. I sent Nurtec, she didn't pick it up yet. Has not seen her eye doctor. Weight is up 13 lbs, not a candidate for injectable medications, A1C is perfect. Migraine aura of yawning, usually right sided occipital, spreads forward, behind right eye, has nausea, sensitive to sound. Really can't distinguish IIH vs migraine headaches, IIH was so long ago.   Update February 18, 2023 SS: On 12/30/2022 went to the ER reporting blurry vision and confusion, migraine earlier that day with typical nausea and blurry vision. Took Maxalt .  Later that evening she became confused worsening vision changes, passed out.  EMS reported blood pressure over 240 systolic.  170s in the ER.  CT angio of head did not show any emergent large vessel occlusion or high-grade stenosis.  MRI of the brain showed patchy edematous appearance along the superior and posterior aspect of the cerebral hemispheres consistent with PRES  Labs showed WBC count 15.8, hemoglobin 15.2, glucose 146,  troponin 65> 67, BNP over 1100  Patient was given IV Toradol , IV Benadryl , oral clonidine , IV labetalol  and IV Lasix .  On 12/23/2022 she had a fall with left anterior shoulder dislocation.  With the above, now suspicious for unwitnessed seizure. She had fallen asleep in the chair watching TV.   Scheduled for repeat MRI of the brain 03/06/2023.  Repeat EEG has been ordered.  Working with PCP for BP regulation, taking Cardene,  clonidine , propranolol , spironolactone . Clonidine  and propranolol  have been chronic for anxiety. BP good today 128/86.   Migraines have been really well. Never started Emgality , insurance wouldn't cover that or Nurtec. Has only been taking Zonegran .   Confusion has resolved. Feels doing better. PCP has tried to get several weight loss drugs covered without successful. Doing PT for the left shoulder, unclear if surgery is needed.   Update 05/19/2023 SS: MRI of the brain with and without contrast January 2025 was normal, the areas of patchy edematous appearance consistent with PRES have resolved.  EEG was normal. Still doing PT for left arm injury, waiting for an MRI, still not better. Headaches are doing well, 2 migraines last month, took Nurtec with excellent benefit! Saw PCP last week. BP was low last week, had to go get fluids. Considering trying to get weight loss drug.   Update 12/29/23 SS: Down 15 lbs. Migraines much better, about 1 a month. Will take ibuprofen  with excellent benefit. Remains on Zonegran  100 mg at bedtime, has not needed Nurtec or phenergan . Can still get short of breath with activity. BNP has improved. Had left shoulder surgery in May. Has not seen eye doctor. BP 131/84. Came of Zoloft .   REVIEW OF SYSTEMS: Out of a complete 14 system review of symptoms, the patient complains only of the following symptoms,  and all other reviewed systems are negative.  See HPI  ALLERGIES: Allergies  Allergen Reactions   Aspirin Other (See Comments)    Stomach pain  Severe abdominal pain  aluminum aspirin   Buspirone Other (See Comments)    felt weird, heart raced, cloudy  buspirone   Cephalexin  Hives    Unknown reaction- Possibly causes thrush or yeast infection  Other Reaction(s): Not available  Keflex    Duloxetine Other (See Comments)    blisters  duloxetine   Duloxetine Hcl Other (See Comments)    Blisters, shut kidneys down  Cymbalta*   Pseudoephedrine Hives     Other Reaction(s): Not available   Lorazepam  Other (See Comments)    Can't remember anything while on it aggressive    Naproxen  Nausea And Vomiting and Other (See Comments)    cramping  Other Reaction(s): Not available  naproxen    Penicillins Hives    Has patient had a PCN reaction causing immediate rash, facial/tongue/throat swelling, SOB or lightheadedness with hypotension: No  Has patient had a PCN reaction causing severe rash involving mucus membranes or skin necrosis: No  Has patient had a PCN reaction that required hospitalization No  Has patient had a PCN reaction occurring within the last 10 years: No  If all of the above answers are NO, then may proceed with Cephalosporin use.  Other Reaction(s): Not available   Macrobid [Nitrofurantoin Monohyd Macro] Nausea Only and Other (See Comments)    Stomach pain   Sudafed [Pseudoephedrine Hcl] Hives    Insomnia    Topiramate  Other (See Comments)    Numbness/tingling in mouth/feet    HOME MEDICATIONS: Outpatient Medications Prior to Visit  Medication Sig Dispense Refill   ALPRAZolam  (XANAX  XR) 0.5 MG 24 hr tablet Take 0.5 mg by mouth daily.     cloNIDine  (CATAPRES ) 0.1 MG tablet Take 1 tablet (0.1 mg total) by mouth 3 (three) times daily. (Patient taking differently: Take 0.3 mg by mouth 3 (three) times daily.) 90 tablet 0   diphenhydrAMINE  (BENADRYL ) 25 MG tablet Take 25-50 mg by mouth daily as needed for allergies.     famotidine (PEPCID) 10 MG tablet Take 10 mg by mouth daily as needed for heartburn or indigestion.     niCARdipine (CARDENE) 30 MG capsule Take 30 mg by mouth 3 (three) times daily.     ondansetron  (ZOFRAN -ODT) 8 MG disintegrating tablet 8mg  ODT q4 hours prn nausea 10 tablet 0   promethazine  (PHENERGAN ) 25 MG tablet Take 1 tablet (25 mg total) by mouth every 6 (six) hours as needed for nausea or vomiting. 20 tablet 1   propranolol  ER (INDERAL  LA) 120 MG 24 hr capsule Take 120 mg by mouth daily.      Rimegepant Sulfate (NURTEC) 75 MG TBDP Take 1 tablet (75 mg total) by mouth daily as needed (1 tablet at onset of migraine, no more that 1 tablet in 24 hours). 2 tablet 0   spironolactone  (ALDACTONE ) 25 MG tablet Take 25 mg by mouth daily.     tiZANidine  (ZANAFLEX ) 4 MG tablet Take 1 tablet (4 mg total) by mouth every 6 (six) hours as needed for muscle spasms. 30 tablet 1   Vitamin D, Ergocalciferol, (DRISDOL) 1.25 MG (50000 UNIT) CAPS capsule Take 50,000 Units by mouth 2 (two) times a week. Thursday and Sunday     zonisamide  (ZONEGRAN ) 100 MG capsule Take 1 capsule (100 mg total) by mouth at bedtime. 90 capsule 3   HYDROcodone -acetaminophen  (NORCO) 7.5-325 MG tablet Take 1  tablet by mouth every 6 (six) hours as needed for moderate pain (pain score 4-6).     ondansetron  (ZOFRAN -ODT) 4 MG disintegrating tablet Take 1 tablet (4 mg total) by mouth every 8 (eight) hours as needed. 20 tablet 0   oxyCODONE  (ROXICODONE ) 5 MG immediate release tablet Take 1-2 tablets (5-10 mg total) by mouth every 4 (four) hours as needed. 40 tablet 0   sertraline  (ZOLOFT ) 100 MG tablet Take 100 mg by mouth daily.     No facility-administered medications prior to visit.    PAST MEDICAL HISTORY: Past Medical History:  Diagnosis Date   ADHD (attention deficit hyperactivity disorder)    ADHD, adult residual type    Anemia    Anxiety    Depression    Family history of adverse reaction to anesthesia    sister has PONV   GERD (gastroesophageal reflux disease)    Headache    otc med prn - last one 2 months ago   New onset of congestive heart failure (HCC) 12/31/2022   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   PRES (posterior reversible encephalopathy syndrome)    Pseudotumor cerebri     PAST SURGICAL HISTORY: Past Surgical History:  Procedure Laterality Date   ABDOMINAL HYSTERECTOMY     BICEPT TENODESIS Left 07/24/2023   Procedure: TENODESIS, BICEPS;  Surgeon: Sharl Selinda Dover, MD;  Location: [facility];   Service: Orthopedics;  Laterality: Left;   CESAREAN SECTION     x 2   CYSTOSCOPY N/A 06/19/2015   Procedure: CYSTOSCOPY;  Surgeon: Ovid All, MD;  Location: [facility];  Service: Gynecology;  Laterality: N/A;   GASTRECTOMY     sleeve, [facility]   IR GENERIC HISTORICAL  05/12/2016   IR FLUORO GUIDED NEEDLE PLC ASPIRATION/INJECTION LOC 05/12/2016 Oneil Officer, MD [facility]   LAPAROSCOPIC VAGINAL HYSTERECTOMY WITH SALPINGECTOMY Bilateral 06/19/2015   Procedure: LAPAROSCOPIC ASSISTED VAGINAL HYSTERECTOMY WITH SALPINGECTOMY;  Surgeon: Ovid All, MD;  Location: [facility];  Service: Gynecology;  Laterality: Bilateral;   lumbar pucture     x2   SACROILIAC JOINT FUSION Left 10/28/2017   Procedure: SACROILIAC JOINT FUSION;  Surgeon: Burnetta Aures, MD;  Location[facility];  Service: Orthopedics;  Laterality: Left;  90 mins   SHOULDER ARTHROSCOPY WITH ROTATOR CUFF REPAIR Left 07/24/2023   Procedure: ARTHROSCOPY, SHOULDER, WITH ROTATOR CUFF REPAIR;  Surgeon: Sharl Selinda Dover, MD;  Location: [facility];  Service: Orthopedics;  Laterality: Left;   TONSILLECTOMY      FAMILY HISTORY: Family History  Problem Relation Age of Onset   Pneumonia Mother        Sepsis   Heart attack Father     SOCIAL HISTORY: Social History   Socioeconomic History   Marital status: Married    Spouse name: Not on file   Number of children: 2   Years of education: cosmetology   Highest education level: Not on file  Occupational History   Occupation: Homemaker  Tobacco Use   Smoking status: Never   Smokeless tobacco: Never  Vaping Use   Vaping status: Never Used  Substance and Sexual Activity   Alcohol use: Not Currently   Drug use: No   Sexual activity: Yes    Birth control/protection: None    Comment: Husband had vasectomy  Other Topics Concern   Not on file  Social History Narrative   Lives at home with her husband and children.   Right-handed.   3-4 cups caffeine  per day.   Social Drivers of  Health   Financial Resource Strain: Low Risk  (06/05/2022)   [facility]   Overall Financial Resource Strain (CARDIA)    Difficulty of Paying Living Expenses: Not hard at all  Food Insecurity: No Food Insecurity (12/31/2022)   Hunger Vital Sign    Worried About Running Out of Food in the Last Year: Never true    Ran Out of Food in the Last Year: Never true  Transportation Needs: No Transportation Needs (12/31/2022)   PRAPARE - Administrator, Civil Service (Medical): No    Lack of Transportation (Non-Medical): No  Physical Activity: Not on file  Stress: Not on file  Social Connections: Unknown (07/01/2021)   Received from [facility]   Social Network    Social Network: Not on file  Intimate Partner Violence: Not At Risk (12/31/2022)   Humiliation, Afraid, Rape, and Kick questionnaire    Fear of Current or Ex-Partner: No    Emotionally Abused: No    Physically Abused: No    Sexually Abused: No   PHYSICAL EXAM  Vitals:   12/29/23 1051  BP: 131/84  Pulse: 71  SpO2: 94%  Weight: 230 lb (104.3 kg)  Height: 5' 4 (1.626 m)    Body mass index is 39.48 kg/m.  Generalized: Well developed, in no acute distress  Neurological examination  Mentation: Alert oriented to time, place, history taking. Follows all commands speech and language fluent Cranial nerve II-XII: Pupils were equal round reactive to light. Extraocular movements were full, visual field were full on confrontational test. Facial sensation and strength were normal.  Head turning and shoulder shrug  were normal and symmetric. Motor: Normal except decreased strength left upper extremity post op Sensory: Sensory testing is intact to soft touch on all 4 extremities. No evidence of extinction is noted.  Coordination: Cerebellar testing reveals good finger-nose-finger and  Heel-to-shin is normal bilaterally. Gait and station: Gait is normal.  Reflexes: Deep tendon reflexes are symmetric and normal  bilaterally.   DIAGNOSTIC DATA (LABS, IMAGING, TESTING) - I reviewed patient records, labs, notes, testing and imaging myself where available.  Lab Results  Component Value Date   WBC 10.5 07/15/2023   HGB 12.8 07/15/2023   HCT 39.0 07/15/2023   MCV 101.8 (H) 07/15/2023   PLT 500 (H) 07/15/2023      Component Value Date/Time   NA 134 (L) 07/15/2023 1327   K 4.2 07/15/2023 1327   CL 102 07/15/2023 1327   CO2 22 07/15/2023 1327   GLUCOSE 121 (H) 07/15/2023 1327   BUN 19 07/15/2023 1327   CREATININE 1.05 (H) 07/15/2023 1327   CALCIUM 9.6 07/15/2023 1327   PROT 7.3 12/31/2022 0002   ALBUMIN 3.7 12/31/2022 0002   AST 28 12/31/2022 0002   ALT 41 12/31/2022 0002   ALKPHOS 98 12/31/2022 0002   BILITOT 0.6 12/31/2022 0002   GFRNONAA >60 07/15/2023 1327   GFRAA >60 02/18/2018 2006   No results found for: CHOL, HDL, LDLCALC, LDLDIRECT, TRIG, CHOLHDL Lab Results  Component Value Date   HGBA1C 5.4 12/31/2022   No results found for: [id number] Lab Results  Component Value Date   TSH 2.744 12/31/2022    Lauraine Born, AGNP-C, DNP 12/29/2023, 10:56 AM [facility] [address] [place], [place] [address] [phone]

## 2023-12-29 NOTE — Progress Notes (Deleted)
 Patient: [name] Date of Birth: 1980-06-20  Reason for Visit: Follow up History from: Patient Primary Neurologist: Onita  ASSESSMENT AND PLAN 43 y.o. year old female   1.  Posterior reversible encephalopathy syndrome 2.  Hypertensive encephalopathy 3.  History of pseudotumor cerebri 4.  Chronic migraine headaches  -Doing well overall.  Migraines under very good control.  About 2 a month. -Continue Zonegran  100 mg at bedtime for migraine preventative -Continue Nurtec 75 mg tablet as needed for acute headache, avoid triptan due to uncontrolled hypertension.  I refilled her Phenergan  to take as needed.  Mention in ER note mild QT prolongation, we discussed antiemetics can have this side effect.  Use with caution. -MRI of the brain with and without contrast in January 2025 was normal, previous reported patchy edematous areas consistent with PRES had resolved -EEG was normal -Recommend follow-up with ophthalmology with history of IH -Close follow-up with primary care for strict management of blood pressure -Follow-up in 6 months or sooner if needed  IMPRESSION: Normal MRI scan of the brain with and without contrast.  Upon comparison with previous MRI films from 12/31/2022 the previously described frontal and parieto-occipital cortical T2 hyperintensities have resolved completely.    CONCLUSION: This is a  normal awake EEG.  There is no electrodiagnostic evidence of epileptiform discharge.  HISTORY  [name] is a 43 years old right-handed female, accompanied by her husband [name], seen in refer by her primary care physician nurse practitioner Burnard Ellen for evaluation of pseudotumor cerebri, initial evaluation was on May 06 2016.   I saw her in 2011, She had a history of hypertension during pregnancy, postpartum depression, frequent headaches postpartum day, there was mild evidence of papillary edema on examination, she was referred for CT head without contrast  that was normal in December 2011, lumbar puncture on February 06 2010, patient reported elevated open pressure 44 cm water , she later developed difficulty urination, low back pain, had MRI of lumbar on February 08 2010, that was normal.   I reviewed and summarized the referring note, reported a history of ADHD since age 23, only tolerate Adderall extended release, anxiety, currently taking Wellbutrin  300 mg daily, recently started on BuSpar 15 mg twice a day, Klonopin 1 mg twice a day as needed, also taking instant release Adderall 5 mg one tablet in the afternoon   She  lost insurance has lost follow-up since 2011, today she came in complains of gradual worsening headaches since 2016, now she has daily left occipital headache, spreading forward to become left retro-orbital area severe pounding headache with associated light noise sensitivity, nauseous, she went to emergency room about 6 times over the past 12 months for headache treatment, she also had steady weight gain, complains of transient difficulty focusing with sudden body positional change. She denies tinnitus, no hearing loss.  UPDATE Jul 07 2016:YY She had a fluoroscopy guided lumbar puncture on May 07 2016, open pressure was 23 cm water , closing pressure was 12 cm water .   Post LP she developed severe positional related headache, require hospital admission, was treated with Morphine  as needed, also received scheduled Percocet, Fioricet . She eventually had a blood patch on March twelfth 2018.   She could not tolerate Topamax , she complains of bilateral feet neuropathic pain, numbness tingling, also complains of alternating of the taste especially with soda, and her mouth feel raw, she could not tolerate it, she was put on Zonegran  100 mg twice a day since and of  March 2018, she could not tolerate it better, she noticed improvement of her headaches.   Over the past 2 months, on average he has one or two headache each week, left-sided lateralized  severe pounding headache with associated light noise sensitivity, nauseous, she is now taking Maxalt  10 mg, sometimes Fioricet  as needed, this will usually take care of the headache within one hour.   she is planning on to have bariatric surgery soon. have bariatric evaluation Jul 28 2016.   Update November 18, 2022 SS:  Patient was in the ER 11/08/22 for migraine headache, given migraine cocktail with good improvement.      Remains on Zonegran  100 mg at bedtime, Amerge provided brief relief, would return. Having 3-4 migraines a month, can last 1-2 days. Summer is worse. I sent Nurtec, she didn't pick it up yet. Has not seen her eye doctor. Weight is up 13 lbs, not a candidate for injectable medications, A1C is perfect. Migraine aura of yawning, usually right sided occipital, spreads forward, behind right eye, has nausea, sensitive to sound. Really can't distinguish IIH vs migraine headaches, IIH was so long ago.   Update February 18, 2023 SS: On 12/30/2022 went to the ER reporting blurry vision and confusion, migraine earlier that day with typical nausea and blurry vision. Took Maxalt .  Later that evening she became confused worsening vision changes, passed out.  EMS reported blood pressure over 240 systolic.  170s in the ER.  CT angio of head did not show any emergent large vessel occlusion or high-grade stenosis.  MRI of the brain showed patchy edematous appearance along the superior and posterior aspect of the cerebral hemispheres consistent with PRES  Labs showed WBC count 15.8, hemoglobin 15.2, glucose 146,  troponin 65> 67, BNP over 1100  Patient was given IV Toradol , IV Benadryl , oral clonidine , IV labetalol  and IV Lasix .  On 12/23/2022 she had a fall with left anterior shoulder dislocation.  With the above, now suspicious for unwitnessed seizure. She had fallen asleep in the chair watching TV.   Scheduled for repeat MRI of the brain 03/06/2023.  Repeat EEG has been ordered.  Working  with PCP for BP regulation, taking Cardene, clonidine , propranolol , spironolactone . Clonidine  and propranolol  have been chronic for anxiety. BP good today 128/86.   Migraines have been really well. Never started Emgality , insurance wouldn't cover that or Nurtec. Has only been taking Zonegran .   Confusion has resolved. Feels doing better. PCP has tried to get several weight loss drugs covered without successful. Doing PT for the left shoulder, unclear if surgery is needed.   Update 05/19/2023 SS: MRI of the brain with and without contrast January 2025 was normal, the areas of patchy edematous appearance consistent with PRES have resolved.  EEG was normal. Still doing PT for left arm injury, waiting for an MRI, still not better. Headaches are doing well, 2 migraines last month, took Nurtec with excellent benefit! Saw PCP last week. BP was low last week, had to go get fluids. Considering trying to get weight loss drug.   Update 12/29/23 SS:   REVIEW OF SYSTEMS: Out of a complete 14 system review of symptoms, the patient complains only of the following symptoms, and all other reviewed systems are negative.  See HPI  ALLERGIES: Allergies  Allergen Reactions   Aspirin Other (See Comments)    Stomach pain Severe abdominal pain   Duloxetine Hcl Other (See Comments)    Blisters, shut kidneys down  Cymbalta*   Lorazepam  Other (See  Comments)    Can't remember anything while on it aggressive    Naproxen  Nausea And Vomiting and Other (See Comments)    cramping   Penicillins Hives    Has patient had a PCN reaction causing immediate rash, facial/tongue/throat swelling, SOB or lightheadedness with hypotension: No Has patient had a PCN reaction causing severe rash involving mucus membranes or skin necrosis: No Has patient had a PCN reaction that required hospitalization No Has patient had a PCN reaction occurring within the last 10 years: No If all of the above answers are NO, then may proceed  with Cephalosporin use.   Keflex  [Cephalexin ]     Unknown reaction- Possibly causes thrush or yeast infection    Buspar [Buspirone] Other (See Comments)    felt weird, heart raced, cloudy   Macrobid [Nitrofurantoin Monohyd Macro] Nausea Only and Other (See Comments)    Stomach pain   Sudafed [Pseudoephedrine Hcl] Hives    Insomnia    Topiramate  Other (See Comments)    Numbness/tingling in mouth/feet    HOME MEDICATIONS: Outpatient Medications Prior to Visit  Medication Sig Dispense Refill   ALPRAZolam  (XANAX  XR) 0.5 MG 24 hr tablet Take 0.5 mg by mouth daily.     cloNIDine  (CATAPRES ) 0.1 MG tablet Take 1 tablet (0.1 mg total) by mouth 3 (three) times daily. (Patient taking differently: Take 0.3 mg by mouth 3 (three) times daily.) 90 tablet 0   diphenhydrAMINE  (BENADRYL ) 25 MG tablet Take 25-50 mg by mouth daily as needed for allergies.     famotidine (PEPCID) 10 MG tablet Take 10 mg by mouth daily as needed for heartburn or indigestion.     HYDROcodone -acetaminophen  (NORCO) 7.5-325 MG tablet Take 1 tablet by mouth every 6 (six) hours as needed for moderate pain (pain score 4-6).     niCARdipine (CARDENE) 30 MG capsule Take 30 mg by mouth 3 (three) times daily.     ondansetron  (ZOFRAN -ODT) 4 MG disintegrating tablet Take 1 tablet (4 mg total) by mouth every 8 (eight) hours as needed. 20 tablet 0   ondansetron  (ZOFRAN -ODT) 8 MG disintegrating tablet 8mg  ODT q4 hours prn nausea 10 tablet 0   oxyCODONE  (ROXICODONE ) 5 MG immediate release tablet Take 1-2 tablets (5-10 mg total) by mouth every 4 (four) hours as needed. 40 tablet 0   promethazine  (PHENERGAN ) 25 MG tablet Take 1 tablet (25 mg total) by mouth every 6 (six) hours as needed for nausea or vomiting. 20 tablet 1   propranolol  ER (INDERAL  LA) 120 MG 24 hr capsule Take 120 mg by mouth daily.     Rimegepant Sulfate (NURTEC) 75 MG TBDP Take 1 tablet (75 mg total) by mouth daily as needed (1 tablet at onset of migraine, no more that 1  tablet in 24 hours). 2 tablet 0   sertraline  (ZOLOFT ) 100 MG tablet Take 100 mg by mouth daily.     spironolactone  (ALDACTONE ) 25 MG tablet Take 25 mg by mouth daily.     tiZANidine  (ZANAFLEX ) 4 MG tablet Take 1 tablet (4 mg total) by mouth every 6 (six) hours as needed for muscle spasms. 30 tablet 1   Vitamin D, Ergocalciferol, (DRISDOL) 1.25 MG (50000 UNIT) CAPS capsule Take 50,000 Units by mouth 2 (two) times a week. Thursday and Sunday     zonisamide  (ZONEGRAN ) 100 MG capsule Take 1 capsule (100 mg total) by mouth at bedtime. 90 capsule 3   No facility-administered medications prior to visit.    PAST MEDICAL HISTORY: Past Medical History:  Diagnosis Date   ADHD (attention deficit hyperactivity disorder)    ADHD, adult residual type    Anemia    Anxiety    Depression    Family history of adverse reaction to anesthesia    sister has PONV   GERD (gastroesophageal reflux disease)    Headache    otc med prn - last one 2 months ago   New onset of congestive heart failure (HCC) 12/31/2022   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   PRES (posterior reversible encephalopathy syndrome)    Pseudotumor cerebri     PAST SURGICAL HISTORY: Past Surgical History:  Procedure Laterality Date   ABDOMINAL HYSTERECTOMY     BICEPT TENODESIS Left 07/24/2023   Procedure: TENODESIS, BICEPS;  Surgeon: Sharl Selinda Dover, MD;  Location: [facility];  Service: Orthopedics;  Laterality: Left;   CESAREAN SECTION     x 2   CYSTOSCOPY N/A 06/19/2015   Procedure: CYSTOSCOPY;  Surgeon: Ovid All, MD;  Location: [facility];  Service: Gynecology;  Laterality: N/A;   GASTRECTOMY     sleeve, [facility]   IR GENERIC HISTORICAL  05/12/2016   IR FLUORO GUIDED NEEDLE PLC ASPIRATION/INJECTION LOC 05/12/2016 Oneil Officer, MD [facility]   LAPAROSCOPIC VAGINAL HYSTERECTOMY WITH SALPINGECTOMY Bilateral 06/19/2015   Procedure: LAPAROSCOPIC ASSISTED VAGINAL HYSTERECTOMY WITH SALPINGECTOMY;  Surgeon: Ovid All,  MD;  Location: [facility];  Service: Gynecology;  Laterality: Bilateral;   lumbar pucture     x2   SACROILIAC JOINT FUSION Left 10/28/2017   Procedure: SACROILIAC JOINT FUSION;  Surgeon: Burnetta Aures, MD;  Location: [facility] OR;  Service: Orthopedics;  Laterality: Left;  90 mins   SHOULDER ARTHROSCOPY WITH ROTATOR CUFF REPAIR Left 07/24/2023   Procedure: ARTHROSCOPY, SHOULDER, WITH ROTATOR CUFF REPAIR;  Surgeon: Sharl Selinda Dover, MD;  Location: [facility];  Service: Orthopedics;  Laterality: Left;   TONSILLECTOMY      FAMILY HISTORY: Family History  Problem Relation Age of Onset   Pneumonia Mother        Sepsis   Heart attack Father     SOCIAL HISTORY: Social History   Socioeconomic History   Marital status: Married    Spouse name: Not on file   Number of children: 2   Years of education: cosmetology   Highest education level: Not on file  Occupational History   Occupation: Homemaker  Tobacco Use   Smoking status: Never   Smokeless tobacco: Never  Vaping Use   Vaping status: Never Used  Substance and Sexual Activity   Alcohol use: Not Currently   Drug use: No   Sexual activity: Yes    Birth control/protection: None    Comment: Husband had vasectomy  Other Topics Concern   Not on file  Social History Narrative   Lives at home with her husband and children.   Right-handed.   3-4 cups caffeine  per day.   Social Drivers of Corporate Investment Banker Strain: Low Risk  (06/05/2022)   Received from [facility]   Overall Financial Resource Strain (CARDIA)    Difficulty of Paying Living Expenses: Not hard at all  Food Insecurity: No Food Insecurity (12/31/2022)   Hunger Vital Sign    Worried About Running Out of Food in the Last Year: Never true    Ran Out of Food in the Last Year: Never true  Transportation Needs: No Transportation Needs (12/31/2022)   PRAPARE - Administrator, Civil Service (Medical): No    Lack of Transportation (Non-Medical):  No  Physical  Activity: Not on file  Stress: Not on file  Social Connections: Unknown (07/01/2021)   Received from [facility]   Social Network    Social Network: Not on file  Intimate Partner Violence: Not At Risk (12/31/2022)   Humiliation, Afraid, Rape, and Kick questionnaire    Fear of Current or Ex-Partner: No    Emotionally Abused: No    Physically Abused: No    Sexually Abused: No    PHYSICAL EXAM  There were no vitals filed for this visit.   There is no height or weight on file to calculate BMI.  Generalized: Well developed, in no acute distress  Neurological examination  Mentation: Alert oriented to time, place, history taking. Follows all commands speech and language fluent Cranial nerve II-XII: Pupils were equal round reactive to light. Extraocular movements were full, visual field were full on confrontational test. Facial sensation and strength were normal.  Head turning and shoulder shrug  were normal and symmetric. Motor: Normal except decreased strength left upper extremity Sensory: Sensory testing is intact to soft touch on all 4 extremities. No evidence of extinction is noted.  Coordination: Cerebellar testing reveals good finger-nose-finger with the right unable to perform with the left due to injury.  Heel-to-shin is normal bilaterally. Gait and station: Gait is normal.  Reflexes: Deep tendon reflexes are symmetric and normal bilaterally.   DIAGNOSTIC DATA (LABS, IMAGING, TESTING) - I reviewed patient records, labs, notes, testing and imaging myself where available.  Lab Results  Component Value Date   WBC 10.5 07/15/2023   HGB 12.8 07/15/2023   HCT 39.0 07/15/2023   MCV 101.8 (H) 07/15/2023   PLT 500 (H) 07/15/2023      Component Value Date/Time   NA 134 (L) 07/15/2023 1327   K 4.2 07/15/2023 1327   CL 102 07/15/2023 1327   CO2 22 07/15/2023 1327   GLUCOSE 121 (H) 07/15/2023 1327   BUN 19 07/15/2023 1327   CREATININE 1.05 (H) 07/15/2023 1327   CALCIUM 9.6  07/15/2023 1327   PROT 7.3 12/31/2022 0002   ALBUMIN 3.7 12/31/2022 0002   AST 28 12/31/2022 0002   ALT 41 12/31/2022 0002   ALKPHOS 98 12/31/2022 0002   BILITOT 0.6 12/31/2022 0002   GFRNONAA >60 07/15/2023 1327   GFRAA >60 02/18/2018 2006   No results found for: CHOL, HDL, LDLCALC, LDLDIRECT, TRIG, CHOLHDL Lab Results  Component Value Date   HGBA1C 5.4 12/31/2022   No results found for: [id number] Lab Results  Component Value Date   TSH 2.744 12/31/2022    Lauraine Born, AGNP-C, DNP 12/29/2023, 5:39 AM [facility] [address] [place], [place] [address] [phone]

## 2024-01-12 ENCOUNTER — Other Ambulatory Visit (HOSPITAL_COMMUNITY): Payer: Self-pay

## 2024-01-12 ENCOUNTER — Telehealth: Payer: Self-pay

## 2024-01-12 NOTE — Telephone Encounter (Signed)
 Pharmacy Patient Advocate Encounter   Received notification from Fax that prior authorization for Nurtec is required/requested.   Insurance verification completed.   The patient is insured through [facility] MEDICAID.   Per test claim: PA required; PA submitted to above mentioned insurance via Latent Key/confirmation #/EOC [id number] Status is pending

## 2024-01-13 NOTE — Telephone Encounter (Signed)
   I have sent the patient a [facility] to make her aware of the issue.

## 2024-02-08 ENCOUNTER — Other Ambulatory Visit (HOSPITAL_COMMUNITY): Payer: Self-pay

## 2024-08-10 ENCOUNTER — Ambulatory Visit: Admitting: Neurology
